# Patient Record
Sex: Male | Born: 1965 | Race: White | Hispanic: No | Marital: Single | Smoking: Former smoker
Health system: Southern US, Community
[De-identification: ages and names within clinical notes are randomized; demographics above are authoritative.]

## PROBLEM LIST (undated history)

## (undated) DIAGNOSIS — K219 Gastro-esophageal reflux disease without esophagitis: Secondary | ICD-10-CM

## (undated) DIAGNOSIS — R519 Headache, unspecified: Secondary | ICD-10-CM

## (undated) DIAGNOSIS — N200 Calculus of kidney: Secondary | ICD-10-CM

## (undated) DIAGNOSIS — I499 Cardiac arrhythmia, unspecified: Secondary | ICD-10-CM

## (undated) DIAGNOSIS — R51 Headache: Secondary | ICD-10-CM

## (undated) DIAGNOSIS — I4891 Unspecified atrial fibrillation: Secondary | ICD-10-CM

## (undated) DIAGNOSIS — G4730 Sleep apnea, unspecified: Secondary | ICD-10-CM

## (undated) HISTORY — DX: Sleep apnea, unspecified: G47.30

## (undated) HISTORY — PX: OTHER SURGICAL HISTORY: SHX169

---

## 2003-12-21 ENCOUNTER — Other Ambulatory Visit: Payer: Self-pay

## 2005-01-26 ENCOUNTER — Emergency Department: Payer: Self-pay | Admitting: Emergency Medicine

## 2005-01-27 ENCOUNTER — Emergency Department: Payer: Self-pay | Admitting: Unknown Physician Specialty

## 2005-02-09 ENCOUNTER — Ambulatory Visit: Payer: Self-pay | Admitting: Urology

## 2005-02-20 ENCOUNTER — Emergency Department: Payer: Self-pay | Admitting: Emergency Medicine

## 2005-04-06 ENCOUNTER — Emergency Department: Payer: Self-pay | Admitting: Emergency Medicine

## 2005-05-06 ENCOUNTER — Emergency Department: Payer: Self-pay | Admitting: Emergency Medicine

## 2006-03-29 ENCOUNTER — Emergency Department: Payer: Self-pay | Admitting: Internal Medicine

## 2006-11-04 ENCOUNTER — Emergency Department (HOSPITAL_COMMUNITY): Admission: EM | Admit: 2006-11-04 | Discharge: 2006-11-04 | Payer: Self-pay | Admitting: Emergency Medicine

## 2006-11-05 ENCOUNTER — Inpatient Hospital Stay (HOSPITAL_COMMUNITY): Admission: AD | Admit: 2006-11-05 | Discharge: 2006-11-09 | Payer: Self-pay | Admitting: Psychiatry

## 2006-11-05 ENCOUNTER — Ambulatory Visit: Payer: Self-pay | Admitting: Psychiatry

## 2006-11-18 ENCOUNTER — Emergency Department: Payer: Self-pay

## 2008-12-07 ENCOUNTER — Emergency Department: Payer: Self-pay | Admitting: Emergency Medicine

## 2008-12-08 ENCOUNTER — Emergency Department: Payer: Self-pay | Admitting: Unknown Physician Specialty

## 2009-12-08 ENCOUNTER — Emergency Department: Payer: Self-pay | Admitting: Emergency Medicine
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
## 2013-03-08 ENCOUNTER — Emergency Department: Payer: Self-pay | Admitting: Emergency Medicine

## 2013-07-30 ENCOUNTER — Emergency Department: Payer: Self-pay | Admitting: Emergency Medicine

## 2013-07-30 LAB — CBC
HCT: 45.8 % (ref 40.0–52.0)
HGB: 15.8 g/dL (ref 13.0–18.0)
MCH: 30.6 pg (ref 26.0–34.0)
MCHC: 34.5 g/dL (ref 32.0–36.0)
MCV: 89 fL (ref 80–100)
PLATELETS: 204 10*3/uL (ref 150–440)
RBC: 5.17 10*6/uL (ref 4.40–5.90)
RDW: 12.5 % (ref 11.5–14.5)
WBC: 9.5 10*3/uL (ref 3.8–10.6)

## 2013-07-30 LAB — URINALYSIS, COMPLETE
BILIRUBIN, UR: NEGATIVE
BLOOD: NEGATIVE
Bacteria: NONE SEEN
Glucose,UR: 150 mg/dL (ref 0–75)
Ketone: NEGATIVE
Leukocyte Esterase: NEGATIVE
Nitrite: NEGATIVE
PH: 6 (ref 4.5–8.0)
Protein: NEGATIVE
Specific Gravity: 1.019 (ref 1.003–1.030)
Squamous Epithelial: NONE SEEN
WBC UR: <1 /HPF (ref 0–5)

## 2013-07-30 LAB — COMPREHENSIVE METABOLIC PANEL
ALBUMIN: 4.1 g/dL (ref 3.4–5.0)
Alkaline Phosphatase: 94 U/L
Anion Gap: 2 — ABNORMAL LOW (ref 7–16)
BUN: 17 mg/dL (ref 7–18)
Bilirubin,Total: 0.6 mg/dL (ref 0.2–1.0)
CHLORIDE: 102 mmol/L (ref 98–107)
CREATININE: 0.71 mg/dL (ref 0.60–1.30)
Calcium, Total: 9.3 mg/dL (ref 8.5–10.1)
Co2: 32 mmol/L (ref 21–32)
Glucose: 109 mg/dL — ABNORMAL HIGH (ref 65–99)
OSMOLALITY: 274 (ref 275–301)
POTASSIUM: 4 mmol/L (ref 3.5–5.1)
SGOT(AST): 44 U/L — ABNORMAL HIGH (ref 15–37)
SGPT (ALT): 53 U/L (ref 12–78)
Sodium: 136 mmol/L (ref 136–145)
Total Protein: 7.8 g/dL (ref 6.4–8.2)

## 2015-09-29 ENCOUNTER — Encounter: Payer: Self-pay | Admitting: Medical Oncology

## 2015-09-29 ENCOUNTER — Emergency Department: Payer: Self-pay

## 2015-09-29 ENCOUNTER — Observation Stay
Admission: EM | Admit: 2015-09-29 | Discharge: 2015-10-01 | Disposition: A | Discharge status: Home / Self Care | Payer: Self-pay | Attending: Internal Medicine | Admitting: Internal Medicine

## 2015-09-29 DIAGNOSIS — Z8249 Family history of ischemic heart disease and other diseases of the circulatory system: Secondary | ICD-10-CM | POA: Insufficient documentation

## 2015-09-29 DIAGNOSIS — K219 Gastro-esophageal reflux disease without esophagitis: Secondary | ICD-10-CM | POA: Insufficient documentation

## 2015-09-29 DIAGNOSIS — R109 Unspecified abdominal pain: Secondary | ICD-10-CM | POA: Insufficient documentation

## 2015-09-29 DIAGNOSIS — Z590 Homelessness: Secondary | ICD-10-CM | POA: Insufficient documentation

## 2015-09-29 DIAGNOSIS — I4891 Unspecified atrial fibrillation: Principal | ICD-10-CM | POA: Diagnosis present

## 2015-09-29 DIAGNOSIS — I429 Cardiomyopathy, unspecified: Secondary | ICD-10-CM | POA: Insufficient documentation

## 2015-09-29 DIAGNOSIS — R112 Nausea with vomiting, unspecified: Secondary | ICD-10-CM | POA: Insufficient documentation

## 2015-09-29 DIAGNOSIS — R42 Dizziness and giddiness: Secondary | ICD-10-CM | POA: Insufficient documentation

## 2015-09-29 DIAGNOSIS — R0602 Shortness of breath: Secondary | ICD-10-CM | POA: Insufficient documentation

## 2015-09-29 DIAGNOSIS — Z87442 Personal history of urinary calculi: Secondary | ICD-10-CM | POA: Insufficient documentation

## 2015-09-29 DIAGNOSIS — R519 Headache, unspecified: Secondary | ICD-10-CM

## 2015-09-29 DIAGNOSIS — R51 Headache: Secondary | ICD-10-CM | POA: Insufficient documentation

## 2015-09-29 DIAGNOSIS — I10 Essential (primary) hypertension: Secondary | ICD-10-CM | POA: Insufficient documentation

## 2015-09-29 DIAGNOSIS — R17 Unspecified jaundice: Secondary | ICD-10-CM | POA: Insufficient documentation

## 2015-09-29 HISTORY — DX: Unspecified atrial fibrillation: I48.91

## 2015-09-29 HISTORY — DX: Calculus of kidney: N20.0

## 2015-09-29 HISTORY — DX: Gastro-esophageal reflux disease without esophagitis: K21.9

## 2015-09-29 LAB — URINALYSIS COMPLETE WITH MICROSCOPIC (ARMC ONLY)
Bacteria, UA: NONE SEEN
Bilirubin Urine: NEGATIVE
Glucose, UA: NEGATIVE mg/dL
Hgb urine dipstick: NEGATIVE
Leukocytes, UA: NEGATIVE
Nitrite: NEGATIVE
PROTEIN: 30 mg/dL — AB
Specific Gravity, Urine: 1.03 (ref 1.005–1.030)
Squamous Epithelial / LPF: NONE SEEN
pH: 9 — ABNORMAL HIGH (ref 5.0–8.0)

## 2015-09-29 LAB — COMPREHENSIVE METABOLIC PANEL
ALBUMIN: 4.5 g/dL (ref 3.5–5.0)
ALK PHOS: 81 U/L (ref 38–126)
ALT: 41 U/L (ref 17–63)
ANION GAP: 7 (ref 5–15)
AST: 39 U/L (ref 15–41)
BILIRUBIN TOTAL: 1.9 mg/dL — AB (ref 0.3–1.2)
BUN: 19 mg/dL (ref 6–20)
CO2: 23 mmol/L (ref 22–32)
Calcium: 9.8 mg/dL (ref 8.9–10.3)
Chloride: 107 mmol/L (ref 101–111)
Creatinine, Ser: 0.65 mg/dL (ref 0.61–1.24)
GFR calc Af Amer: >60 mL/min (ref >60)
GLUCOSE: 96 mg/dL (ref 65–99)
Potassium: 4.7 mmol/L (ref 3.5–5.1)
Sodium: 137 mmol/L (ref 135–145)
Total Protein: 7.5 g/dL (ref 6.5–8.1)

## 2015-09-29 LAB — URINE DRUG SCREEN, QUALITATIVE (ARMC ONLY)
AMPHETAMINES, UR SCREEN: NOT DETECTED
Barbiturates, Ur Screen: NOT DETECTED
Benzodiazepine, Ur Scrn: NOT DETECTED
Cannabinoid 50 Ng, Ur ~~LOC~~: POSITIVE — AB
Cocaine Metabolite,Ur ~~LOC~~: NOT DETECTED
MDMA (ECSTASY) UR SCREEN: NOT DETECTED
METHADONE SCREEN, URINE: NOT DETECTED
Opiate, Ur Screen: NOT DETECTED
Phencyclidine (PCP) Ur S: NOT DETECTED
TRICYCLIC, UR SCREEN: NOT DETECTED

## 2015-09-29 LAB — CBC
HEMATOCRIT: 48.3 % (ref 40.0–52.0)
HEMOGLOBIN: 16.6 g/dL (ref 13.0–18.0)
MCH: 30 pg (ref 26.0–34.0)
MCHC: 34.3 g/dL (ref 32.0–36.0)
MCV: 87.5 fL (ref 80.0–100.0)
Platelets: 180 10*3/uL (ref 150–440)
RBC: 5.52 MIL/uL (ref 4.40–5.90)
RDW: 13.5 % (ref 11.5–14.5)
WBC: 10.4 10*3/uL (ref 3.8–10.6)

## 2015-09-29 LAB — BRAIN NATRIURETIC PEPTIDE: B NATRIURETIC PEPTIDE 5: 45 pg/mL (ref 0.0–100.0)

## 2015-09-29 LAB — TROPONIN I: Troponin I: <0.03 ng/mL (ref <0.031)

## 2015-09-29 MED ORDER — SODIUM CHLORIDE 0.9% FLUSH: 3.0000 mL | INTRAVENOUS | Status: DC | PRN

## 2015-09-29 MED ORDER — DILTIAZEM HCL 25 MG/5ML IV SOLN
10.0000 mg | Freq: Once | INTRAVENOUS | Status: AC
  Administered 2015-09-29: 10 mg via INTRAVENOUS
  Filled 2015-09-29: qty 5

## 2015-09-29 MED ORDER — ACETAMINOPHEN 650 MG RE SUPP: 650.0000 mg | Freq: Four times a day (QID) | RECTAL | Status: DC | PRN

## 2015-09-29 MED ORDER — HEPARIN (PORCINE) IN NACL 100-0.45 UNIT/ML-% IJ SOLN
1350.0000 [IU]/h | INTRAMUSCULAR | Status: DC
  Administered 2015-09-29: 1350 [IU]/h via INTRAVENOUS
  Filled 2015-09-29 (×3): qty 250

## 2015-09-29 MED ORDER — ONDANSETRON HCL 4 MG PO TABS: 4.0000 mg | ORAL_TABLET | Freq: Four times a day (QID) | ORAL | Status: DC | PRN

## 2015-09-29 MED ORDER — HYDROCODONE-ACETAMINOPHEN 5-325 MG PO TABS: 1.0000 | ORAL_TABLET | ORAL | Status: DC | PRN

## 2015-09-29 MED ORDER — SODIUM CHLORIDE 0.9 % IV SOLN: 250.0000 mL | INTRAVENOUS | Status: DC | PRN

## 2015-09-29 MED ORDER — FAMOTIDINE 20 MG PO TABS
20.0000 mg | ORAL_TABLET | Freq: Every day | ORAL | Status: DC
  Administered 2015-09-29 – 2015-10-01 (×3): 20 mg via ORAL
  Filled 2015-09-29 (×3): qty 1

## 2015-09-29 MED ORDER — ACETAMINOPHEN 325 MG PO TABS: 650.0000 mg | ORAL_TABLET | Freq: Four times a day (QID) | ORAL | Status: DC | PRN

## 2015-09-29 MED ORDER — SODIUM CHLORIDE 0.9% FLUSH
3.0000 mL | Freq: Two times a day (BID) | INTRAVENOUS | Status: DC
  Administered 2015-09-29 – 2015-09-30 (×2): 3 mL via INTRAVENOUS

## 2015-09-29 MED ORDER — MORPHINE SULFATE (PF) 4 MG/ML IV SOLN: 4.0000 mg | INTRAVENOUS | Status: DC | PRN

## 2015-09-29 MED ORDER — ALBUTEROL SULFATE (2.5 MG/3ML) 0.083% IN NEBU: 2.5000 mg | INHALATION_SOLUTION | RESPIRATORY_TRACT | Status: DC | PRN

## 2015-09-29 MED ORDER — ASPIRIN 325 MG PO TABS
325.0000 mg | ORAL_TABLET | Freq: Every day | ORAL | Status: DC
  Administered 2015-09-29 – 2015-09-30 (×2): 325 mg via ORAL
  Filled 2015-09-29 (×2): qty 1

## 2015-09-29 MED ORDER — ONDANSETRON HCL 4 MG/2ML IJ SOLN: 4.0000 mg | Freq: Four times a day (QID) | INTRAMUSCULAR | Status: DC | PRN

## 2015-09-29 MED ORDER — DILTIAZEM HCL 25 MG/5ML IV SOLN
20.0000 mg | Freq: Once | INTRAVENOUS | Status: AC
  Administered 2015-09-29: 20 mg via INTRAVENOUS
  Filled 2015-09-29: qty 5

## 2015-09-29 MED ORDER — HEPARIN BOLUS VIA INFUSION
5000.0000 [IU] | Freq: Once | INTRAVENOUS | Status: AC
  Administered 2015-09-29: 5000 [IU] via INTRAVENOUS
  Filled 2015-09-29: qty 5000

## 2015-09-29 MED ORDER — METOPROLOL TARTRATE 25 MG PO TABS
25.0000 mg | ORAL_TABLET | Freq: Two times a day (BID) | ORAL | Status: DC
  Administered 2015-09-29 – 2015-09-30 (×2): 25 mg via ORAL
  Filled 2015-09-29 (×2): qty 1

## 2015-09-29 NOTE — H&P (Addendum)
[facility] - [place] [facility]   PATIENT NAME: Rickey Lane    MR#:  [id number]  DATE OF BIRTH:  04-07-1966  DATE OF ADMISSION:  09/29/2015  PRIMARY CARE PHYSICIAN: No primary care provider on file.   REQUESTING/REFERRING PHYSICIAN: Leona Carry, MD  CHIEF COMPLAINT:   Chief Complaint  Patient presents with  . Headache  . Nausea  . Flank Pain  Headache, nausea and flank pain today.  HISTORY OF PRESENT ILLNESS:  Rickey Lane  is a 50 y.o. male with a known history of Hypertension, GERD and kidney stone. The patient woke up in the morning with a severe headache and dizziness. He took Tylenol with some improvement but is still feel dizzy and weak. He also complains of mild right flank pain on and off since he was diagnosis with kidney stone 2 months ago. He was found A. fib with RVR at 120s in the ED. Was treated with Cardizem 10 mg 1 dose without improvement. Current Heart rate is 100-127. CAT scan of the head is negative.  PAST MEDICAL HISTORY:   Past Medical History  Diagnosis Date  . Hypertension   . GERD (gastroesophageal reflux disease)   . Kidney stone     PAST SURGICAL HISTORY:  No past surgical history on file. no surgery.  SOCIAL HISTORY:   Social History  Substance Use Topics  . Smoking status: Never Smoker   . Smokeless tobacco: Not on file  . Alcohol Use: No    FAMILY HISTORY:   Family History  Problem Relation Age of Onset  . Heart attack Father   . Heart attack Other   His father had a heart attack at 69's years old and got open heart surgery had surgeries 66's years old. His mother's sister also had a heart attack.  DRUG ALLERGIES:   Allergies  Allergen Reactions  . Nitroglycerin     REVIEW OF SYSTEMS:  CONSTITUTIONAL: No fever, But has headache, dizziness and weakness.  EYES: No blurred or double vision.  EARS, NOSE, AND THROAT: No tinnitus or ear pain.  RESPIRATORY: No cough, but has mild  shortness of breath, no wheezing or hemoptysis.  CARDIOVASCULAR: No chest pain, orthopnea, edema.  GASTROINTESTINAL: No nausea, vomiting, diarrhea or abdominal pain. Intermittent and mild right flank pain. GENITOURINARY: No dysuria, hematuria.  ENDOCRINE: No polyuria, nocturia,  HEMATOLOGY: No anemia, easy bruising or bleeding SKIN: No rash or lesion. MUSCULOSKELETAL: No joint pain or arthritis.   NEUROLOGIC: No tingling, numbness, weakness.  PSYCHIATRY: No anxiety or depression.   MEDICATIONS AT HOME:   Prior to Admission medications   Medication Sig Start Date End Date Taking? Authorizing Provider  acetaminophen (TYLENOL) 325 MG tablet Take 650 mg by mouth every 6 (six) hours as needed.   Yes Historical Provider, MD  ranitidine (ZANTAC) 150 MG tablet Take 150 mg by mouth daily as needed for heartburn.   Yes Historical Provider, MD      VITAL SIGNS:  Blood pressure 130/82, pulse 74, temperature 97.6 F (36.4 C), temperature source Oral, resp. rate 15, height  (1.778 m), weight 90.719 kg (200 lb), SpO2 100 %.  PHYSICAL EXAMINATION:  GENERAL:  50 y.o.-year-old patient lying in the bed with no acute distress.  EYES: Pupils equal, round, reactive to light and accommodation. No scleral icterus. Extraocular muscles intact.  HEENT: Head atraumatic, normocephalic. Oropharynx and nasopharynx clear.  NECK:  Supple, no jugular venous distention. No thyroid enlargement, no tenderness.  LUNGS: Normal breath  sounds bilaterally, no wheezing, rales,rhonchi or crepitation. No use of accessory muscles of respiration.  CARDIOVASCULAR: Irregular rate and rhythm, tachycardia. No murmurs, rubs, or gallops.  ABDOMEN: Soft, nontender, nondistended. Bowel sounds present. No organomegaly or mass.  EXTREMITIES: No pedal edema, cyanosis, or clubbing.  NEUROLOGIC: Cranial nerves II through XII are intact. Muscle strength 5/5 in all extremities. Sensation intact. Gait not checked.  PSYCHIATRIC: The  patient is alert and oriented x 3.  SKIN: No obvious rash, lesion, or ulcer.   LABORATORY PANEL:   CBC  Recent Labs Lab 09/29/15 1104  WBC 10.4  HGB 16.6  HCT 48.3  PLT 180   ------------------------------------------------------------------------------------------------------------------  Chemistries   Recent Labs Lab 09/29/15 1104  NA 137  K 4.7  CL 107  CO2 23  GLUCOSE 96  BUN 19  CREATININE 0.65  CALCIUM 9.8  AST 39  ALT 41  ALKPHOS 81  BILITOT 1.9*   ------------------------------------------------------------------------------------------------------------------  Cardiac Enzymes  Recent Labs Lab 09/29/15 1104  TROPONINI <0.03   ------------------------------------------------------------------------------------------------------------------  RADIOLOGY:  Dg Chest 1 View  09/29/2015  CLINICAL DATA:  Headache, dizzyness, nausea, vomiting x today. Sob, afib. Nonsmoker. EXAM: CHEST  1 VIEW COMPARISON:  12/07/2008 FINDINGS: Lungs are clear. Heart size and mediastinal contours are within normal limits. No effusion. Visualized skeletal structures are unremarkable. IMPRESSION: No acute cardiopulmonary disease. Electronically Signed   By: Corlis Leak  Hassell M.D.   On: 09/29/2015 11:55   Ct Head Wo Contrast  09/29/2015  CLINICAL DATA:  Acute onset frontal headache, nausea, and dizziness beginning this morning. EXAM: CT HEAD WITHOUT CONTRAST TECHNIQUE: Contiguous axial images were obtained from the base of the skull through the vertex without intravenous contrast. COMPARISON:  12/08/2009 FINDINGS: No evidence of intracranial hemorrhage, brain edema, or other signs of acute infarction. No evidence of intracranial mass lesion or mass effect. No abnormal extraaxial fluid collections identified. Ventricles are normal in size. No skull abnormality identified. IMPRESSION: Negative noncontrast head CT. Electronically Signed   By: Myles RosenthalJohn  Stahl M.D.   On: 09/29/2015 11:57    EKG:    Orders placed or performed in visit on 12/08/08  . EKG 12-Lead    IMPRESSION AND PLAN:   New-onset A. fib with RVR The patient will be placed for observation.  Continue telemetry monitor, I will give 1 more dose Cardizem IV, start aspirin, heparin drip, check lipid panel, echocardiogram and cardiology consult. If heart rate is not controlled, I will start Cardizem drip.  Hypertension. Start Lopressor po.  Elevated bilirubin. I will get ultrasound of abdomen. Repeat bilirubin level tomorrow morning.  History of nephrolithiasis with mild right flank pain. Pain control.  Headache. Unclear etiology resolved with Tylenol.  All the records are reviewed and case discussed with ED provider. Management plans discussed with the patient, family and they are in agreement.  CODE STATUS: Full code  TOTAL TIME TAKING CARE OF THIS PATIENT: 55 minutes.    Shaune Pollackhen, Lynsee Wands M.D on 09/29/2015 at 1:01 PM  Between 7am to 6pm - Pager - [phone]  After 6pm go to [email or web address] - password [email or web address] [facility]  [facility]  Office  [phone]  CC: Primary care physician; No primary care provider on file.

## 2015-09-29 NOTE — ED Notes (Signed)
Pt reports he woke up this am with headache and nausea.

## 2015-09-29 NOTE — Progress Notes (Signed)
Patient alert and oriented x4. Oriented to room, unit, and call bell. Admission completed. No complaints at this time. Will cont to assess. Skin assessment verified by Jessica Christmas, RN. Telemetry box verified. Rickey Lane   

## 2015-09-29 NOTE — ED Notes (Signed)
Pt also reports rt sided flank pain x 2 months. No difficulties voiding.

## 2015-09-29 NOTE — ED Provider Notes (Signed)
[facility] Emergency Department Provider Note  ____________________________________________  Time seen: Approximately 10:30 AM  I have reviewed the triage vital signs and the nursing notes.   HISTORY  Chief Complaint Headache; Nausea; and Flank Pain    HPI Rickey Lane is a 50 y.o. male who woke up this morning with a severe headache and dizziness. Patient states that he has never had any significant headache like that before. Patient took some Tylenol and states the headache is somewhat improved but he is still fillings slightly dizzy and weak. Patient states that his flank pain on the right side has been there since he had a kidney stone 2 months ago. It still causes him some difficulty urinating and some intermittent pain. Patient denies any significant history of heart or lung problems. Patient denies any vomiting or change in his bowels. He has not had any significant fever chills or recent illnesses. Patient states the headache is now about a 1 on a scale of 0-10 prior to taking the Tylenol it was about a 10. Nothing seems to make the headache worse or better.   Past Medical History  Diagnosis Date  . Hypertension   . GERD (gastroesophageal reflux disease)     There are no active problems to display for this patient.   No past surgical history on file.  Current Outpatient Rx  Name  Route  Sig  Dispense  Refill  . acetaminophen (TYLENOL) 325 MG tablet   Oral   Take 650 mg by mouth every 6 (six) hours as needed.         . ranitidine (ZANTAC) 150 MG tablet   Oral   Take 150 mg by mouth daily as needed for heartburn.           Allergies Nitroglycerin  No family history on file.  Social History Social History  Substance Use Topics  . Smoking status: Never Smoker   . Smokeless tobacco: None  . Alcohol Use: No    Review of Systems Constitutional: No fever/chills Eyes: No visual changes. ENT: No sore throat. Cardiovascular:  Denies chest pain. Respiratory: Denies shortness of breath. GastrointestinalPatient is complaining that he's had point pain mild on the right since having a kidney stone about 2 months ago, and occasionally gets nauseated. no vomiting.  No diarrhea.  No constipation. Genitourinary: Negative for dysuria. Musculoskeletal: Negative for back pain. Skin: Negative for rash. Neurological: Positive for headache and dizziness and just generalized weakness all over, no focal numbness.  10-point ROS otherwise negative.  ____________________________________________   PHYSICAL EXAM:  VITAL SIGNS: ED Triage Vitals  Enc Vitals Group     BP 09/29/15 0856 130/82 mmHg     Pulse Rate 09/29/15 0856 74     Resp 09/29/15 0856 15     Temp 09/29/15 0856 97.6 F (36.4 C)     Temp Source 09/29/15 0856 Oral     SpO2 09/29/15 0856 100 %     Weight 09/29/15 0856 200 lb (90.719 kg)     Height 09/29/15 0856  (1.778 m)     Head Cir --      Peak Flow --      Pain Score 09/29/15 0900 8     Pain Loc --      Pain Edu? --      Excl. in GC? --     Constitutional: Alert and oriented. Well appearing and in no acute distress.Patient states his headache is much improved at this time  but he still feels slightly dizzy. Eyes: Conjunctivae are normal. PERRL. EOMI. Head: Atraumatic. Nose: No congestion/rhinnorhea. Mouth/Throat: Mucous membranes are moist.  Oropharynx non-erythematous. Neck: No stridor.   Cardiovascular: Rapid rate, irregular rhythm. Grossly normal heart sounds.  Good peripheral circulation. Respiratory: Normal respiratory effort.  No retractions. Lungs CTAB. Gastrointestinal: Soft and nontender. No distention. No abdominal bruits. No CVA tenderness. Musculoskeletal: No lower extremity tenderness nor edema.  No joint effusions. Neurologic:  Normal speech and language. No gross focal neurologic deficits are appreciated. No gait instability. Skin:  Skin is warm, dry and intact. No rash  noted. Psychiatric: Mood and affect are normal. Speech and behavior are normal.  ____________________________________________   LABS (all labs ordered are listed, but only abnormal results are displayed)  Labs Reviewed  URINALYSIS COMPLETEWITH MICROSCOPIC ([facility] ONLY) - Abnormal; Notable for the following:    Color, Urine YELLOW (*)    APPearance CLEAR (*)    Ketones, ur TRACE (*)    pH 9.0 (*)    Protein, ur 30 (*)    All other components within normal limits  COMPREHENSIVE METABOLIC PANEL - Abnormal; Notable for the following:    Total Bilirubin 1.9 (*)    All other components within normal limits  CBC  TROPONIN I  BRAIN NATRIURETIC PEPTIDE  URINE DRUG SCREEN, QUALITATIVE ([facility] ONLY)   ____________________________________________  EKG  ED ECG REPORT I, Leona Carryaylor,  Deleah Tison M, the attending physician, personally viewed and interpreted this ECG.   Date: 09/29/2015  EKG Time: 1058  Rate: 127  Rhythm: atrial fibrillation, rate 127  Axis: Normal  Intervals:Prolonged QT  ST&T Change: Nonspecific ST and T-wave changes  ____________________________________________  RADIOLOGY  Dg Chest 1 View  09/29/2015  CLINICAL DATA:  Headache, dizzyness, nausea, vomiting x today. Sob, afib. Nonsmoker. EXAM: CHEST  1 VIEW COMPARISON:  12/07/2008 FINDINGS: Lungs are clear. Heart size and mediastinal contours are within normal limits. No effusion. Visualized skeletal structures are unremarkable. IMPRESSION: No acute cardiopulmonary disease. Electronically Signed   By: Corlis Leak  Hassell M.D.   On: 09/29/2015 11:55   Ct Head Wo Contrast  09/29/2015  CLINICAL DATA:  Acute onset frontal headache, nausea, and dizziness beginning this morning. EXAM: CT HEAD WITHOUT CONTRAST TECHNIQUE: Contiguous axial images were obtained from the base of the skull through the vertex without intravenous contrast. COMPARISON:  12/08/2009 FINDINGS: No evidence of intracranial hemorrhage, brain edema, or other signs of acute  infarction. No evidence of intracranial mass lesion or mass effect. No abnormal extraaxial fluid collections identified. Ventricles are normal in size. No skull abnormality identified. IMPRESSION: Negative noncontrast head CT. Electronically Signed   By: Myles RosenthalJohn  Stahl M.D.   On: 09/29/2015 11:57    ____________________________________________   PROCEDURES  Procedure(s) performed: None  Critical Care performed: No  ____________________________________________   INITIAL IMPRESSION / ASSESSMENT AND PLAN / ED COURSE  Pertinent labs & imaging results that were available during my care of the patient were reviewed by me and considered in my medical decision making (see chart for details).  12:34 PM Patient was given IV Cardizem for his atrial fibrillation with RVR. This was new onset for patient. Patient will get routine cardiac enzymes as well as CT head to evaluate his headache and dizziness.  12:34 PM Labs and CT scan were negative. Patient was given 10 mg IV Cardizem which helped slow his heart rate down but he did not convert back into a normal rhythm. Patient though felt some better after the IV Cardizem. Dr. Fonnie BirkenheadWhiting  is going to admit the patient. ____________________________________________   FINAL CLINICAL IMPRESSION(S) / ED DIAGNOSES  Final diagnoses:  Acute nonintractable headache, unspecified headache type  Dizziness  Atrial fibrillation with RVR (HCC)       Leona Carry, MD 09/29/15 1234

## 2015-09-29 NOTE — ED Notes (Signed)
Pt states he woke up this AM with complaints of a headache and "not feeling well", also states some right sided flank pain with a hx of kidney stones, pt awake and alert in no acute distress, EDP at bedside

## 2015-09-30 ENCOUNTER — Encounter: Payer: Self-pay | Admitting: Student

## 2015-09-30 ENCOUNTER — Observation Stay: Payer: Self-pay

## 2015-09-30 DIAGNOSIS — I4891 Unspecified atrial fibrillation: Secondary | ICD-10-CM

## 2015-09-30 LAB — CBC
HCT: 45.7 % (ref 40.0–52.0)
HEMOGLOBIN: 15.8 g/dL (ref 13.0–18.0)
MCH: 31 pg (ref 26.0–34.0)
MCHC: 34.5 g/dL (ref 32.0–36.0)
MCV: 89.9 fL (ref 80.0–100.0)
Platelets: 156 10*3/uL (ref 150–440)
RBC: 5.08 MIL/uL (ref 4.40–5.90)
RDW: 13.3 % (ref 11.5–14.5)
WBC: 9 10*3/uL (ref 3.8–10.6)

## 2015-09-30 LAB — HEPARIN LEVEL (UNFRACTIONATED)
HEPARIN UNFRACTIONATED: 0.65 [IU]/mL (ref 0.30–0.70)
HEPARIN UNFRACTIONATED: 0.42 [IU]/mL (ref 0.30–0.70)

## 2015-09-30 LAB — LIPID PANEL
CHOL/HDL RATIO: 3 ratio
Cholesterol: 152 mg/dL (ref 0–200)
HDL: 51 mg/dL (ref >40)
LDL CALC: 89 mg/dL (ref 0–99)
Triglycerides: 62 mg/dL (ref <150)
VLDL: 12 mg/dL (ref 0–40)

## 2015-09-30 LAB — BILIRUBIN, DIRECT: BILIRUBIN DIRECT: 0.2 mg/dL (ref 0.1–0.5)

## 2015-09-30 LAB — BILIRUBIN, TOTAL: BILIRUBIN TOTAL: 1 mg/dL (ref 0.3–1.2)

## 2015-09-30 LAB — TSH: TSH: 2.291 u[IU]/mL (ref 0.350–4.500)

## 2015-09-30 MED ORDER — APIXABAN 5 MG PO TABS
5.0000 mg | ORAL_TABLET | Freq: Two times a day (BID) | ORAL | Status: DC
  Administered 2015-09-30 – 2015-10-01 (×3): 5 mg via ORAL
  Filled 2015-09-30 (×3): qty 1

## 2015-09-30 MED ORDER — METOPROLOL TARTRATE 50 MG PO TABS
50.0000 mg | ORAL_TABLET | Freq: Two times a day (BID) | ORAL | Status: DC
  Administered 2015-09-30 – 2015-10-01 (×2): 50 mg via ORAL
  Filled 2015-09-30 (×2): qty 1

## 2015-09-30 NOTE — Progress Notes (Signed)
Patient ID: Rickey Lane, male   DOB: [birth year], 50 y.o.   MRN: [id number] [facility] [place] [facility]   PATIENT NAME: Rickey Lane    MR#:  [id number]  DATE OF BIRTH:  Feb 16, 1966  SUBJECTIVE:   Came in with headache and dizziness and found to have afib Denies any complaints today REVIEW OF SYSTEMS:   Review of Systems  Constitutional: Negative for fever, chills and weight loss.  HENT: Negative for ear discharge, ear pain and nosebleeds.   Eyes: Negative for blurred vision, pain and discharge.  Respiratory: Negative for sputum production, shortness of breath, wheezing and stridor.   Cardiovascular: Positive for palpitations. Negative for chest pain, orthopnea and PND.  Gastrointestinal: Negative for nausea, vomiting, abdominal pain and diarrhea.  Genitourinary: Negative for urgency and frequency.  Musculoskeletal: Negative for back pain and joint pain.  Neurological: Negative for sensory change, speech change, focal weakness and weakness.  Psychiatric/Behavioral: Negative for depression and hallucinations. The patient is not nervous/anxious.   All other systems reviewed and are negative.  Tolerating Diet:yes Tolerating PT: not needed  DRUG ALLERGIES:   Allergies  Allergen Reactions  . Nitroglycerin     VITALS:  Blood pressure 126/86, pulse 95, temperature 97.7 F (36.5 C), temperature source Oral, resp. rate 16, height  (1.778 m), weight 90.719 kg (200 lb), SpO2 95 %.  PHYSICAL EXAMINATION:   Physical Exam  GENERAL:  50 y.o.-year-old patient lying in the bed with no acute distress.  EYES: Pupils equal, round, reactive to light and accommodation. No scleral icterus. Extraocular muscles intact.  HEENT: Head atraumatic, normocephalic. Oropharynx and nasopharynx clear.  NECK:  Supple, no jugular venous distention. No thyroid enlargement, no tenderness.  LUNGS: Normal breath sounds bilaterally, no wheezing, rales, rhonchi.  No use of accessory muscles of respiration.  CARDIOVASCULAR: S1, S2 normal. No murmurs, rubs, or gallops. Irregular irregular ABDOMEN: Soft, nontender, nondistended. Bowel sounds present. No organomegaly or mass.  EXTREMITIES: No cyanosis, clubbing or edema b/l.    NEUROLOGIC: Cranial nerves II through XII are intact. No focal Motor or sensory deficits b/l.   PSYCHIATRIC:  patient is alert and oriented x 3.  SKIN: No obvious rash, lesion, or ulcer.   LABORATORY PANEL:  CBC  Recent Labs Lab 09/30/15 0256  WBC 9.0  HGB 15.8  HCT 45.7  PLT 156    Chemistries   Recent Labs Lab 09/29/15 1104 09/30/15 0256  NA 137  --   K 4.7  --   CL 107  --   CO2 23  --   GLUCOSE 96  --   BUN 19  --   CREATININE 0.65  --   CALCIUM 9.8  --   AST 39  --   ALT 41  --   ALKPHOS 81  --   BILITOT 1.9* 1.0   Cardiac Enzymes  Recent Labs Lab 09/29/15 1104  TROPONINI <0.03   RADIOLOGY:  Dg Chest 1 View  09/29/2015  CLINICAL DATA:  Headache, dizzyness, nausea, vomiting x today. Sob, afib. Nonsmoker. EXAM: CHEST  1 VIEW COMPARISON:  12/07/2008 FINDINGS: Lungs are clear. Heart size and mediastinal contours are within normal limits. No effusion. Visualized skeletal structures are unremarkable. IMPRESSION: No acute cardiopulmonary disease. Electronically Signed   By: Corlis Leak M.D.   On: 09/29/2015 11:55   Ct Head Wo Contrast  09/29/2015  CLINICAL DATA:  Acute onset frontal headache, nausea, and dizziness beginning this morning. EXAM: CT HEAD WITHOUT CONTRAST TECHNIQUE:  Contiguous axial images were obtained from the base of the skull through the vertex without intravenous contrast. COMPARISON:  12/08/2009 FINDINGS: No evidence of intracranial hemorrhage, brain edema, or other signs of acute infarction. No evidence of intracranial mass lesion or mass effect. No abnormal extraaxial fluid collections identified. Ventricles are normal in size. No skull abnormality identified. IMPRESSION: Negative  noncontrast head CT. Electronically Signed   By: Myles RosenthalJohn  Stahl M.D.   On: 09/29/2015 11:57   [place] Abdomen Limited Ruq  09/30/2015  CLINICAL DATA:  Elevated bili Rubin. Nausea and vomiting, 2 days duration. Recently ate breakfast. EXAM: US ABDOMEN LIMITED - RIGHT UPPER QUADRANT COMPARISON:  CT 04/06/2005 FINDINGS: Gallbladder: No gallstones or wall thickening visualized. No sonographic Murphy sign noted by sonographer. Common bile duct: Diameter: 3.2 mm, normal Liver: No focal lesion identified. Within normal limits in parenchymal echogenicity. IMPRESSION: Normal examination.  No hepatobiliary pathology evident. Electronically Signed   By: Paulina FusiMark  Shogry M.D.   On: 09/30/2015 10:51   ASSESSMENT AND PLAN:   Rickey Lane is a 50 y.o. male with a known history of Hypertension, GERD and kidney stone. The patient woke up in the morning with a severe headache and dizziness  1.New-onset A. fib with RVR -etio unclear -echo pending Check TSH  Continue telemetry monitor prn Cardizem IV - start aspirin, heparin drip -normal lipid panel HR in the 100's on metoprolol 25 mg bid  2. Hypertension. - -Lopressor po.  3Elevated bilirubin. USG abdomen negative  4.History of nephrolithiasis with mild right flank pain. Pain control.  5.Headache. Unclear etiology resolved with Tylenol.  Case discussed with Care Management/Social Worker. Management plans discussed with the patient, family and they are in agreement.  CODE STATUS: full  DVT Prophylaxis: heparin gtt  TOTAL TIME TAKING CARE OF THIS PATIENT: 30 minutes.  >50% time spent on counselling and coordination of care  POSSIBLE D/C IN  1 DAYS, DEPENDING ON CLINICAL CONDITION.  Note: This dictation was prepared with Dragon dictation along with smaller phrase technology. Any transcriptional errors that result from this process are unintentional.  Shannon Kirkendall M.D on 09/30/2015 at 11:51 AM  Between 7am to 6pm - Pager - [phone]  After 6pm go to  [email or web address] - password [email or web address] [facility]  [facility]  Office  [phone]  CC: Primary care physician; No primary care provider on file.

## 2015-09-30 NOTE — Progress Notes (Signed)
ANTICOAGULATION CONSULT NOTE - Initial Consult  Pharmacy Consult for heparin drip Indication: atrial fibrillation  Allergies  Allergen Reactions  . Nitroglycerin     Patient Measurements: Height: 5\' 10"  (177.8 cm) Weight: 200 lb (90.719 kg) IBW/kg (Calculated) : 73 Heparin Dosing Weight: 91kg  Vital Signs: Temp: 97.7 F (36.5 C) (04/06 1107) Temp Source: Oral (04/06 1107) BP: 126/86 mmHg (04/06 1107) Pulse Rate: 95 (04/06 1108)  Labs:  Recent Labs  09/29/15 1104 09/30/15 0256 09/30/15 1128  HGB 16.6 15.8  --   HCT 48.3 45.7  --   PLT 180 156  --   HEPARINUNFRC  --  0.65 0.42  CREATININE 0.65  --   --   TROPONINI <0.03  --   --     Estimated Creatinine Clearance: 127.9 mL/min (by C-G formula based on Cr of 0.65).   Medical History: Past Medical History  Diagnosis Date  . GERD (gastroesophageal reflux disease)   . Kidney stone     Medications:    Assessment: 50 y/o M with new-onset afib.   Goal of Therapy:  Heparin level 0.3-0.7 units/ml Monitor platelets by anticoagulation protocol: Yes   Plan:  Heparin level is at goal so will continue heparin drip at current rate and recheck a HL and CBC in AM.    Luisa Harthristy, Jackalyn Haith D 09/30/2015,2:02 PM

## 2015-09-30 NOTE — Consult Note (Signed)
Cardiology Consult    Patient ID: Rickey LarocheChristopher J Lane MRN: [id number], DOB/AGE: 1965-07-02   Admit date: 09/29/2015 Date of Consult: 09/30/2015  Primary Physician: No primary care provider on file. Reason for Consult: Atrial Fibrillation with RVR Primary Cardiologist[facility] Requesting Provider: Dr. Imogene Burnhen  History of Present Illness    Rickey LarocheChristopher J Lane is a 50 y.o. male with past medical history of GERD and nephrolithiasis who [facility] for a headache, nausea, and right-sided flank pain.   The patient reports he developed a headache yesterday morning and could not get any relief with OTC medications. He reported also feeling dizzy at that time. HE also noted right-sided flank pain, similar to his previous kidney stones. Reported frequent urination but denied any burning with urination or hematuria.   While in the ED, he was noted to be in atrial fibrillation with RVR, HR in the 120's. UDS was positive for Cannabis. BNP normal at 45. Troponin negative. WBC 10.4. Hgb 16.6. Platelets 180. Electrolytes within normal limits. Bilirubin elevated to 1.9. Creatinine 0.65. TSH is pending. CXR showed no acute cardiopulmonary disease. CT Head with no acute abnormalities.    The patient denied any history of palpitations or chest pain. Reported feeling dizzy and having mild shortness of breath but no other symptoms while in atrial fibrillation. His HR was noted to go into the 140's this morning with ambulation, yet he was asymptomatic. Reports being told in the past his HR was "irregular" but an EKG was not performed at that time and he has never been formally diagnosed with an arrhythmia. Denies any prior history of CAD, HLD or Type 2 DM. Reports having HTN in the past but he lost weight and was able to stop the medications. Reports his BP has been well-controlled since.  Reports his father had his first MI in the 3130's and passed away from a massive MI in his 2560's.  His maternal aunt passed away from an MI at age 49. He denies any personal history of tobacco abuse or alcohol abuse.  Past Medical History   Past Medical History  Diagnosis Date  . GERD (gastroesophageal reflux disease)   . Kidney stone     History reviewed. No pertinent past surgical history.   Allergies  Allergies  Allergen Reactions  . Nitroglycerin     Inpatient Medications    . aspirin  325 mg Oral Daily  . famotidine  20 mg Oral Daily  . metoprolol tartrate  25 mg Oral BID  . sodium chloride flush  3 mL Intravenous Q12H    Family History    Family History  Problem Relation Age of Onset  . Heart attack Father     Initial MI age 49, deceased in his 5760's from a massive MI  . Heart attack Maternal Aunt [id number]    Social History    Social History   Social History  . Marital Status: Single    Spouse Name: N/A  . Number of Children: N/A  . Years of Education: N/A   Occupational History  . Not on file.   Social History Main Topics  . Smoking status: Never Smoker   . Smokeless tobacco: Not on file  . Alcohol Use: No  . Drug Use: Not on file  . Sexual Activity: Not on file   Other Topics Concern  . Not on file   Social History Narrative     Review of Systems    General:  No chills, fever, night sweats or weight changes.  Cardiovascular:  No chest pain, edema, orthopnea, palpitations, paroxysmal nocturnal dyspnea. Positive for dyspnea. Dermatological: No rash, lesions/masses Respiratory: No cough, dyspnea Urologic: No hematuria, dysuria Abdominal:   No vomiting, diarrhea, bright red blood per rectum, melena, or hematemesis. Positive for right flank pain and nausea. Neurologic:  No visual changes, wkns, changes in mental status. Positive for dizziness. All other systems reviewed and are otherwise negative except as noted above.  Physical Exam    Blood pressure 126/86, pulse 95, temperature 97.7 F (36.5 C), temperature source Oral, resp. rate 16,  height  (1.778 m), weight 200 lb (90.719 kg), SpO2 95 %.  General: Pleasant, Caucasian male appearing in NAD. Psych: Normal affect. Neuro: Alert and oriented X 3. Moves all extremities spontaneously. HEENT: Normal  Neck: Supple without bruits or JVD. Lungs:  Resp regular and unlabored, CTA without wheezing or rales. Heart: Irregularly irregular, no s3, s4, or murmurs. Abdomen: Soft, non-tender, non-distended, BS + x 4.  Extremities: No clubbing, cyanosis or edema. DP/PT/Radials 2+ and equal bilaterally.  Labs    Troponin (Point of Care Test) No results for input(s): TROPIPOC in the last 72 hours.  Recent Labs  09/29/15 1104  TROPONINI <0.03   Lab Results  Component Value Date   WBC 9.0 09/30/2015   HGB 15.8 09/30/2015   HCT 45.7 09/30/2015   MCV 89.9 09/30/2015   PLT 156 09/30/2015     Recent Labs Lab 09/29/15 1104 09/30/15 0256  NA 137  --   K 4.7  --   CL 107  --   CO2 23  --   BUN 19  --   CREATININE 0.65  --   CALCIUM 9.8  --   PROT 7.5  --   BILITOT 1.9* 1.0  ALKPHOS 81  --   ALT 41  --   AST 39  --   GLUCOSE 96  --    Lab Results  Component Value Date   CHOL 152 09/30/2015   HDL 51 09/30/2015   LDLCALC 89 09/30/2015   TRIG 62 09/30/2015   No results found for: [facility]   Radiology Studies    Dg Chest 1 View: 09/29/2015  CLINICAL DATA:  Headache, dizzyness, nausea, vomiting x today. Sob, afib. Nonsmoker. EXAM: CHEST  1 VIEW COMPARISON:  12/07/2008 FINDINGS: Lungs are clear. Heart size and mediastinal contours are within normal limits. No effusion. Visualized skeletal structures are unremarkable. IMPRESSION: No acute cardiopulmonary disease. Electronically Signed   By: Corlis Leak M.D.   On: 09/29/2015 11:55   Ct Head Wo Contrast: 09/29/2015  CLINICAL DATA:  Acute onset frontal headache, nausea, and dizziness beginning this morning. EXAM: CT HEAD WITHOUT CONTRAST TECHNIQUE: Contiguous axial images were obtained from the base of the skull through the  vertex without intravenous contrast. COMPARISON:  12/08/2009 FINDINGS: No evidence of intracranial hemorrhage, brain edema, or other signs of acute infarction. No evidence of intracranial mass lesion or mass effect. No abnormal extraaxial fluid collections identified. Ventricles are normal in size. No skull abnormality identified. IMPRESSION: Negative noncontrast head CT. Electronically Signed   By: Myles Rosenthal M.D.   On: 09/29/2015 11:57    EKG & Cardiac Imaging    EKG: Atrial fibrillation with RVR, HR 127. Isolated TWI in Lead III.  Echocardiogram: Pending  Assessment & Plan    1. New-onset atrial fibrillation - reports being told he had an "irregular heart rate" in the past. No formal diagnosis of  an arrhythmia. - This patients CHA2DS2-VASc Score and unadjusted Ischemic Stroke Rate (% per year) is equal to 0.2 % stroke rate/year from a score of 0. (Has a history of HTN but lost weight and says his BP has been well-controlled and his PCP stopped his medications). Would not require long-term anticoagulation. Heparin discontinued. - CXR without acute abnormalities and UA negative for UTI. Will check TSH. Echocardiogram is pending. - Has a significant family history of CAD. Would recommend an ischemic evaluation in the outpatient setting to assess for further etiology of his arrhythmia unless his echo comes back significantly abnormal.  - started on Lopressor  BID. HR improved at rest, now in the 90's. Elevated into the 140's with ambulation. Will need further titration or transition to Cardizem. - Consider initiation of NOAC for 1 month and bring back for DCCV, for it is unclear how long he has been in the rhythm.   2. Right-sided flank pain/ History of Nephrolithiasis - pain currently resolved. - per admitting team   Signed, Ellsworth Lennox, PA-C 09/30/2015, 12:03 PM Pager: [phone]

## 2015-09-30 NOTE — Care Management (Signed)
Attempted CM assessment. Patient is out of room for procedure/test.

## 2015-09-30 NOTE — Progress Notes (Signed)
ANTICOAGULATION CONSULT NOTE - Initial Consult  Pharmacy Consult for heparin drip Indication: atrial fibrillation  Allergies  Allergen Reactions  . Nitroglycerin     Patient Measurements: Height: 5\' 10"  (177.8 cm) Weight: 200 lb (90.719 kg) IBW/kg (Calculated) : 73 Heparin Dosing Weight: 91kg  Vital Signs: Temp: 97.6 F (36.4 C) (04/05 2013) Temp Source: Oral (04/05 2013) BP: 149/82 mmHg (04/05 2013) Pulse Rate: 108 (04/05 2013)  Labs:  Recent Labs  09/29/15 1104 09/30/15 0256  HGB 16.6 15.8  HCT 48.3 45.7  PLT 180 156  HEPARINUNFRC  --  0.65  CREATININE 0.65  --   TROPONINI <0.03  --     Estimated Creatinine Clearance: 127.9 mL/min (by C-G formula based on Cr of 0.65).   Medical History: Past Medical History  Diagnosis Date  . Hypertension   . GERD (gastroesophageal reflux disease)   . Kidney stone     Medications:    Assessment:  Goal of Therapy:  Heparin level 0.3-0.7 units/ml Monitor platelets by anticoagulation protocol: Yes   Plan:  5000 unit bolus and initial rate of 1350 units/hr. First anti-Xa 6 hours after start of infusion. 4/5 03:00 heparin level 0.65. Recheck in 6 hours to confirm.   Donnalynn Wheeless S 09/30/2015,4:15 AM

## 2015-09-30 NOTE — Progress Notes (Addendum)
Per [place], [place], okay to stop heparin infusion.

## 2015-09-30 NOTE — Care Management Note (Signed)
Case Management Note  Patient Details  Name: Rickey Lane MRN: [id number] Date of Birth: 1965/07/24  Subjective/Objective:   CM consult for discharge planning. No PCP, no insurance.  Homeless and living in a shelter for about a month now. Met with patient at bedside. He reports he has gotten a job and has been working for a week. He will get paid next week. No other income otherwise. Applications given for [facility]. Possible discharge tomorrow. Will follow up for discharge medications assit as needed.                  Action/Plan:   Expected Discharge Date:                  Expected Discharge Plan:  Home/Self Care  In-House Referral:  Clinical Social Work  Discharge planning Services  CM Consult, [facility], [facility], Medication Assistance  Post Acute Care Choice:    Choice offered to:     DME Arranged:    DME Agency:     HH Arranged:    [place] Agency:     Status of Service:  In process, will continue to follow  Medicare Important Message Given:    Date Medicare IM Given:    Medicare IM give by:    Date Additional Medicare IM Given:    Additional Medicare Important Message give by:     If discussed at [place] of Stay Meetings, dates discussed:    Additional Comments:  Jolly Mango, RN 09/30/2015, 2:34 PM

## 2015-10-01 ENCOUNTER — Observation Stay (HOSPITAL_BASED_OUTPATIENT_CLINIC_OR_DEPARTMENT_OTHER)
Admit: 2015-10-01 | Discharge: 2015-10-01 | Disposition: A | Discharge status: Home / Self Care | Payer: MEDICAID | Attending: Student | Admitting: Student

## 2015-10-01 ENCOUNTER — Telehealth: Payer: Self-pay | Admitting: Cardiovascular Disease

## 2015-10-01 DIAGNOSIS — I4891 Unspecified atrial fibrillation: Secondary | ICD-10-CM

## 2015-10-01 LAB — ECHOCARDIOGRAM COMPLETE
Height: 70 in
Weight: 3227.2 oz

## 2015-10-01 LAB — PSA: PSA: 0.61 ng/mL (ref 0.00–4.00)

## 2015-10-01 MED ORDER — METOPROLOL TARTRATE 50 MG PO TABS: 50.0000 mg | ORAL_TABLET | Freq: Two times a day (BID) | ORAL | Status: DC

## 2015-10-01 MED ORDER — APIXABAN 5 MG PO TABS: 5.0000 mg | ORAL_TABLET | Freq: Two times a day (BID) | ORAL | Status: DC

## 2015-10-01 NOTE — Progress Notes (Signed)
Patient ID: Artelia LarocheChristopher J Mcdougall, male   DOB: 1965/12/27, 50 y.o.   MRN: [id number]                                          [facility]    Erroll LunaChristopher Hopping was admitted to the Hospital on 09/29/2015 and Discharged  10/01/2015 and should be excused from work/school   for ** 3 days starting 09/29/2015 , may return to work/school without any restrictions. Return to work Monday 10/04/2015  Call Enedina FinnerSona Vessie Olmsted MD, [facility]  [phone] with questions.  Tiffanyann Deroo M.D on 10/01/2015,at 1:31 PM

## 2015-10-01 NOTE — Progress Notes (Signed)
*  PRELIMINARY RESULTS* Echocardiogram 2D Echocardiogram has been performed.  Georgann HousekeeperJerry R Hege 10/01/2015, 8:36 AM

## 2015-10-01 NOTE — Clinical Social Work Note (Signed)
CSW consulted for patient being allegedly homeless. Patient is homeless but has been living at the local homeless shelter where he will return at discharge. Patient will need to contact the shelter himself to make arrangements to return.  York SpanielMonica Keiland Pickering MSW,LCSW [phone]

## 2015-10-01 NOTE — Progress Notes (Signed)
Hospital Problem List     Principal Problem:   New onset atrial fibrillation [facility](HCC) Active Problems:   Atrial fibrillation with RVR [facility](HCC)    Patient Profile:   Primary Cardiologist: Dr. Kirke CorinArida  50 y.o. male w/ PMH of GERD and nephrolithiasis who [facility] on 09/[facility]/2015 for a headache, nausea, and right-sided flank pain. Found to be in atrial fibrillation w/ RVR.  Subjective   Reports mild dyspnea with activity. Denies any chest pain or palpitations. HR improved in currently in the 70- 80's. Main concern this AM is urinary symptoms.  Inpatient Medications    . apixaban  5 mg Oral BID  . famotidine  20 mg Oral Daily  . metoprolol tartrate  50 mg Oral BID  . sodium chloride flush  3 mL Intravenous Q12H    Vital Signs    Filed Vitals:   09/30/15 1108 09/30/15 1929 09/30/15 1945 10/01/15 0429  BP:  118/77  118/74  Pulse: 95 92  75  Temp:  97.7 F (36.5 C)  97.7 F (36.5 C)  TempSrc:  Oral  Oral  Resp:  16  16  Height:      Weight:   201 lb 11.2 oz (91.491 kg)   SpO2:  96%  95%    Intake/Output Summary (Last 24 hours) at 10/01/15 0751 Last data filed at 10/01/15 0431  Gross per 24 hour  Intake    480 ml  Output   1600 ml  Net  -1120 ml   Filed Weights   09/29/15 0856 09/30/15 1945  Weight: 200 lb (90.719 kg) 201 lb 11.2 oz (91.491 kg)    Physical Exam    General: Well developed, well nourished, male appearing in no acute distress. Head: Normocephalic, atraumatic.  Neck: Supple without bruits, JVD not elevated. Lungs:  Resp regular and unlabored, CTA without wheezing or rales. Heart: Irregularly irregular, S1, S2, no S3, S4, or murmur; no rub. Abdomen: Soft, non-tender, non-distended with normoactive bowel sounds. No hepatomegaly. No rebound/guarding. No obvious abdominal masses. Extremities: No clubbing, cyanosis, or edema. Distal pedal pulses are 2+ bilaterally. Neuro: Alert and oriented X 3. Moves all extremities spontaneously. Psych: Normal  affect.  Labs    CBC  Recent Labs  09/29/15 1104 09/30/15 0256  WBC 10.4 9.0  HGB 16.6 15.8  HCT 48.3 45.7  MCV 87.5 89.9  PLT 180 156   Basic Metabolic Panel  Recent Labs  09/29/15 1104  NA 137  K 4.7  CL 107  CO2 23  GLUCOSE 96  BUN 19  CREATININE 0.65  CALCIUM 9.8   Liver Function Tests  Recent Labs  09/29/15 1104 09/30/15 0256  AST 39  --   ALT 41  --   ALKPHOS 81  --   BILITOT 1.9* 1.0  PROT 7.5  --   ALBUMIN 4.5  --    No results for input(s): LIPASE, AMYLASE in the last 72 hours. Cardiac Enzymes  Recent Labs  09/29/15 1104  TROPONINI <0.03   Fasting Lipid Panel  Recent Labs  09/30/15 0256  CHOL 152  HDL 51  LDLCALC 89  TRIG 62  CHOLHDL 3.0   Thyroid Function Tests  Recent Labs  09/30/15 1128  TSH 2.291    Telemetry   Atrial fibrillation, rate-controlled in the 70's - 80's.   Cardiac Studies and Radiology    Dg Chest 1 View: 09/29/2015  CLINICAL DATA:  Headache, dizzyness, nausea, vomiting x today. Sob, afib. Nonsmoker. EXAM: CHEST  1 VIEW COMPARISON:  12/07/2008 FINDINGS: Lungs are clear. Heart size and mediastinal contours are within normal limits. No effusion. Visualized skeletal structures are unremarkable. IMPRESSION: No acute cardiopulmonary disease. Electronically Signed   By: Corlis Leak M.D.   On: 09/29/2015 11:55   Ct Head Wo Contrast: 09/29/2015  CLINICAL DATA:  Acute onset frontal headache, nausea, and dizziness beginning this morning. EXAM: CT HEAD WITHOUT CONTRAST TECHNIQUE: Contiguous axial images were obtained from the base of the skull through the vertex without intravenous contrast. COMPARISON:  12/08/2009 FINDINGS: No evidence of intracranial hemorrhage, brain edema, or other signs of acute infarction. No evidence of intracranial mass lesion or mass effect. No abnormal extraaxial fluid collections identified. Ventricles are normal in size. No skull abnormality identified. IMPRESSION: Negative noncontrast head CT.  Electronically Signed   By: Myles Rosenthal M.D.   On: 09/29/2015 11:57   US Abdomen Limited Ruq: 09/30/2015  CLINICAL DATA:  Elevated bili Rubin. Nausea and vomiting, 2 days duration. Recently ate breakfast. EXAM: US ABDOMEN LIMITED - RIGHT UPPER QUADRANT COMPARISON:  CT 04/06/2005 FINDINGS: Gallbladder: No gallstones or wall thickening visualized. No sonographic Murphy sign noted by sonographer. Common bile duct: Diameter: 3.2 mm, normal Liver: No focal lesion identified. Within normal limits in parenchymal echogenicity. IMPRESSION: Normal examination.  No hepatobiliary pathology evident. Electronically Signed   By: Paulina Fusi M.D.   On: 09/30/2015 10:51    Echocardiogram: Pending  Assessment & Plan    1. New-onset atrial fibrillation - reports being told he had an "irregular heart rate" for over 3 years. No formal diagnosis of an arrhythmia. - This patients CHA2DS2-VASc Score and unadjusted Ischemic Stroke Rate (% per year) is equal to 0.2 % stroke rate/year from a score of 0. Started on Eliquis in case he requires DCCV in 4 weeks. Would not require long-term anticoagulation. Will need 30-day Eliquis card at time of discharge. The patient is currently homeless and Case Management is assisting with medication needs.  - CXR without acute abnormalities and UA negative for UTI. TSH normal. Echocardiogram is pending (Updated as Anticipated Discharge). - Has a significant family history of CAD. Could consider an ischemic evaluation in the outpatient setting to assess for further etiology of his arrhythmia unless his echo comes back significantly abnormal.  - Lopressor increased to  BID. HR now well-controlled in the 70's - 80's.  - If echocardiogram is not significantly abnormal, he would likely be stable for discharge from a cardiac perspective.  2. Right-sided flank pain/ History of Nephrolithiasis/ Dysuria - pain currently resolved. Still reporting difficulty with initiation of stream. - per  admitting team   Signed, Ellsworth Lennox , PA-C 7:51 AM 10/01/2015 Pager: [phone]

## 2015-10-01 NOTE — Discharge Summary (Signed)
[facility] [place] [facility]   PATIENT NAME: Rickey LunaChristopher Lane    MR#:  [id number]  DATE OF BIRTH:  Jul 13, 1965  DATE OF ADMISSION:  09/29/2015 ADMITTING PHYSICIAN: Shaune PollackQing Chen, MD  DATE OF DISCHARGE: 4/7 17  PRIMARY CARE PHYSICIAN: No primary care provider on file.    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 GERD (gastroesophageal reflux disease)   . Kidney stone   . Atrial fibrillation (HCC)     a. initially diagnosed in 09/2015. Reported a history of "irregular HR" for 3+ years    HOSPITAL COURSE:  Rickey LunaChristopher Lane is a 50 y.o. male with a known history of Hypertension, GERD and kidney stone. The patient woke up in the morning with a severe headache and dizziness  1.New-onset A. fib with RVR with cardiomyopathy -etio unclear -echo per Dr Kirke CorinArida shows EF 30-35%  TSH ok Continue telemetry monitor prn Cardizem IV - on po eliquis -normal lipid panel HR in the 100's on metoprolol 50 mg bid--->possible CV at later date  2. Hypertension. - -Lopressor po.  3Elevated bilirubin. USG abdomen negative  4.History of nephrolithiasis with mild right flank pain. Pain control.  5.Headache. Unclear etiology resolved with Tylenol.  D/c home  CONSULTS OBTAINED:  Treatment Team:  Iran OuchMuhammad A Arida, MD  DRUG ALLERGIES:   Allergies  Allergen Reactions  . Nitroglycerin Other (See Comments)    "Shivering" and "decreased heart rate"    DISCHARGE MEDICATIONS:   Current Discharge Medication List    START taking these medications   Details  apixaban (ELIQUIS) 5 MG TABS tablet Take 1 tablet (5 mg total) by mouth 2 (two) times daily. Qty: 60 tablet, Refills: 3    metoprolol (LOPRESSOR) 50 MG  tablet Take 1 tablet (50 mg total) by mouth 2 (two) times daily. Qty: 60 tablet, Refills: 3      CONTINUE these medications which have NOT CHANGED   Details  acetaminophen (TYLENOL) 325 MG tablet Take 650 mg by mouth every 6 (six) hours as needed.    ranitidine (ZANTAC) 150 MG tablet Take 150 mg by mouth daily as needed for heartburn.        If you experience worsening of your admission symptoms, develop shortness of breath, life threatening emergency, suicidal or homicidal thoughts you must seek medical attention immediately by calling 911 or calling your MD immediately  if symptoms less severe.  You Must read complete instructions/literature along with all the possible adverse reactions/side effects for all the Medicines you take and that have been prescribed to you. Take any new Medicines after you have completely understood and accept all the possible adverse reactions/side effects.   Please note  You were cared for by a hospitalist during your hospital stay. If you have any questions about your discharge medications or the care you received while you were in the hospital after you are discharged, you can call the unit and asked to speak with the hospitalist on call if the hospitalist that took care of you is not available. Once you are discharged, your primary care physician will handle any further medical issues. Please note that NO REFILLS for any discharge medications will be authorized once you are discharged, as it is imperative that you return to your primary care physician (  or establish a relationship with a primary care physician if you do not have one) for your aftercare needs so that they can reassess your need for medications and monitor your lab values. Today   SUBJECTIVE   No complaints  VITAL SIGNS:  Blood pressure 123/81, pulse 97, temperature 97.6 F (36.4 C), temperature source Oral, resp. rate 16, height  (1.778 m), weight 91.491 kg (201 lb 11.2 oz), SpO2 99  %.  I/O:   Intake/Output Summary (Last 24 hours) at 10/01/15 0946 Last data filed at 10/01/15 0431  Gross per 24 hour  Intake    480 ml  Output   1600 ml  Net  -1120 ml    PHYSICAL EXAMINATION:  GENERAL:  50 y.o.-year-old patient lying in the bed with no acute distress.  EYES: Pupils equal, round, reactive to light and accommodation. No scleral icterus. Extraocular muscles intact.  HEENT: Head atraumatic, normocephalic. Oropharynx and nasopharynx clear.  NECK:  Supple, no jugular venous distention. No thyroid enlargement, no tenderness.  LUNGS: Normal breath sounds bilaterally, no wheezing, rales,rhonchi or crepitation. No use of accessory muscles of respiration.  CARDIOVASCULAR: S1, S2 normal. No murmurs, rubs, or gallops. Irregularly irregular ABDOMEN: Soft, non-tender, non-distended. Bowel sounds present. No organomegaly or mass.  EXTREMITIES: No pedal edema, cyanosis, or clubbing.  NEUROLOGIC: Cranial nerves II through XII are intact. Muscle strength 5/5 in all extremities. Sensation intact. Gait not checked.  PSYCHIATRIC: The patient is alert and oriented x 3.  SKIN: No obvious rash, lesion, or ulcer.   DATA REVIEW:   CBC   Recent Labs Lab 09/30/15 0256  WBC 9.0  HGB 15.8  HCT 45.7  PLT 156    Chemistries   Recent Labs Lab 09/29/15 1104 09/30/15 0256  NA 137  --   K 4.7  --   CL 107  --   CO2 23  --   GLUCOSE 96  --   BUN 19  --   CREATININE 0.65  --   CALCIUM 9.8  --   AST 39  --   ALT 41  --   ALKPHOS 81  --   BILITOT 1.9* 1.0    Microbiology Results   No results found for this or any previous visit (from the past 240 hour(s)).  RADIOLOGY:  Dg Chest 1 View  09/29/2015  CLINICAL DATA:  Headache, dizzyness, nausea, vomiting x today. Sob, afib. Nonsmoker. EXAM: CHEST  1 VIEW COMPARISON:  12/07/2008 FINDINGS: Lungs are clear. Heart size and mediastinal contours are within normal limits. No effusion. Visualized skeletal structures are unremarkable.  IMPRESSION: No acute cardiopulmonary disease. Electronically Signed   By: Corlis Leak M.D.   On: 09/29/2015 11:55   Ct Head Wo Contrast  09/29/2015  CLINICAL DATA:  Acute onset frontal headache, nausea, and dizziness beginning this morning. EXAM: CT HEAD WITHOUT CONTRAST TECHNIQUE: Contiguous axial images were obtained from the base of the skull through the vertex without intravenous contrast. COMPARISON:  12/08/2009 FINDINGS: No evidence of intracranial hemorrhage, brain edema, or other signs of acute infarction. No evidence of intracranial mass lesion or mass effect. No abnormal extraaxial fluid collections identified. Ventricles are normal in size. No skull abnormality identified. IMPRESSION: Negative noncontrast head CT. Electronically Signed   By: Myles Rosenthal M.D.   On: 09/29/2015 11:57   US Abdomen Limited Ruq  09/30/2015  CLINICAL DATA:  Elevated bili Rubin. Nausea and vomiting, 2 days duration. Recently ate breakfast. EXAM: US ABDOMEN LIMITED - RIGHT UPPER QUADRANT COMPARISON:  CT 04/06/2005 FINDINGS: Gallbladder: No gallstones or wall thickening visualized. No sonographic Murphy sign noted by sonographer. Common bile duct: Diameter: 3.2 mm, normal Liver: No focal lesion identified. Within normal limits in parenchymal echogenicity. IMPRESSION: Normal examination.  No hepatobiliary pathology evident. Electronically Signed   By: Paulina Fusi M.D.   On: 09/30/2015 10:51     Management plans discussed with the patient, family and they are in agreement.  CODE STATUS:     Code Status Orders        Start     Ordered   09/29/15 1831  Full code   Continuous     09/29/15 1830    Code Status History    Date Active Date Inactive Code Status Order ID Comments User Context   This patient has a current code status but no historical code status.      TOTAL TIME TAKING CARE OF THIS PATIENT: 40 minutes.    Leilani Cespedes M.D on 10/01/2015 at 9:46 AM  Between 7am to 6pm - Pager - [phone] After  6pm go to [email or web address] - password [email or web address] [facility]  [place] [facility]  Office  [phone]  CC: Primary care physician; No primary care provider on file.

## 2015-10-01 NOTE — Care Management (Signed)
Faxed prescription and med management application. Patient to pick up after discharge.

## 2015-10-01 NOTE — Telephone Encounter (Signed)
   Patient contacted [facility] on April 7.  Patient understands to follow up with provider Arida on May 11 at 2[facility]15pm [facility]. Patient understands discharge instructions? yes Patient understands medications and regiment? yes Patient understands to bring all medications to this visit? yes  Pt would like an appointment earlier in the day. Forward to scheduling

## 2015-10-01 NOTE — Care Management (Signed)
Email sent to [name] for [facility] appointment.

## 2015-10-01 NOTE — Care Management (Signed)
Eliquis coupon given for free 30 day trial. Patient has no completed medication management application. CM to follow up later and pick up medications for patient at medication management clinic for patient prior to discharge.

## 2015-10-01 NOTE — Progress Notes (Signed)
All d/c papers explained and given to pt. Pt verbalizes understanding. IV d/c intact   Telemetry d/c and notified central tele

## 2015-10-13 ENCOUNTER — Telehealth: Payer: Self-pay

## 2015-10-13 ENCOUNTER — Telehealth: Payer: Self-pay | Admitting: Cardiovascular Disease

## 2015-10-13 NOTE — Telephone Encounter (Signed)
Mr called, lm wants info on becoming [facility] [phone]

## 2015-10-13 NOTE — Telephone Encounter (Signed)
Pt c/o Shortness Of Breath: STAT if SOB developed within the last 24 hours or pt is noticeably SOB on the phone  1. Are you currently SOB (can you hear that pt is SOB on the phone)? yes  2. How long have you been experiencing SOB? Last week   3. Are you SOB when sitting or when up moving around?   All the time   4. Are you currently experiencing any other symptoms?    Feels like heart is beating fast   Walked into office wants nurse to check bp and heart rate

## 2015-10-13 NOTE — Telephone Encounter (Signed)
Pt presented to the office today requesting someone to assess him as he feels his heart rate is elevated and states he is SOB. Reports symptoms started "about a week ago" [facility] new onset afib. Pt has prescriptions for metoprolol and eliquis. He has a post hospital f/u  w/Dr. Kirke CorinArida on May 4 but would like to know if we can "check me out" today. During our conversation, he did not appear to be SOB while sitting and is able to carry on a conversation with me without appearing winded. He denies any other symptoms.  Offered pt appointment today at 1:30pm w/Ryan Dunn, PA-C. Pt declines stating he goes to work at [employer] but needs to go in earlier today and is unable to make this appt. Advised pt that if he feels symptoms need immediate attention, he should go to the ER for further evaluation. He states "I really just want my blood pressure and heart rate checked". He does not have BP cuff or HR monitor at home. Advised pt that most local pharmacies such as [facility], [facility], [facility], have automatic BP machines with which he can use. Pt verbalized understanding of all instructions and is appreciative of the information. Pt states he has no further questions.

## 2015-10-13 NOTE — Telephone Encounter (Signed)
See additional phone note. 

## 2015-10-22 ENCOUNTER — Ambulatory Visit: Payer: Self-pay

## 2015-10-26 ENCOUNTER — Emergency Department (HOSPITAL_COMMUNITY)
Admission: EM | Admit: 2015-10-26 | Discharge: 2015-10-26 | Disposition: A | Discharge status: Home / Self Care | Payer: Self-pay | Attending: Emergency Medicine | Admitting: Emergency Medicine

## 2015-10-26 ENCOUNTER — Encounter (HOSPITAL_COMMUNITY): Payer: Self-pay | Admitting: Emergency Medicine

## 2015-10-26 ENCOUNTER — Emergency Department (HOSPITAL_COMMUNITY): Payer: Self-pay

## 2015-10-26 ENCOUNTER — Telehealth: Payer: Self-pay

## 2015-10-26 DIAGNOSIS — K219 Gastro-esophageal reflux disease without esophagitis: Secondary | ICD-10-CM | POA: Insufficient documentation

## 2015-10-26 DIAGNOSIS — I482 Chronic atrial fibrillation, unspecified: Secondary | ICD-10-CM

## 2015-10-26 DIAGNOSIS — Z5900 Homelessness unspecified: Secondary | ICD-10-CM

## 2015-10-26 DIAGNOSIS — Z609 Problem related to social environment, unspecified: Secondary | ICD-10-CM | POA: Insufficient documentation

## 2015-10-26 DIAGNOSIS — Z79899 Other long term (current) drug therapy: Secondary | ICD-10-CM | POA: Insufficient documentation

## 2015-10-26 DIAGNOSIS — Z87442 Personal history of urinary calculi: Secondary | ICD-10-CM | POA: Insufficient documentation

## 2015-10-26 LAB — COMPREHENSIVE METABOLIC PANEL
ALT: 28 U/L (ref 17–63)
AST: 24 U/L (ref 15–41)
Albumin: 3.7 g/dL (ref 3.5–5.0)
Alkaline Phosphatase: 62 U/L (ref 38–126)
Anion gap: 9 (ref 5–15)
BILIRUBIN TOTAL: 0.8 mg/dL (ref 0.3–1.2)
BUN: 19 mg/dL (ref 6–20)
CHLORIDE: 108 mmol/L (ref 101–111)
CO2: 25 mmol/L (ref 22–32)
Calcium: 9.3 mg/dL (ref 8.9–10.3)
Creatinine, Ser: 1.01 mg/dL (ref 0.61–1.24)
Glucose, Bld: 97 mg/dL (ref 65–99)
POTASSIUM: 4.5 mmol/L (ref 3.5–5.1)
Sodium: 142 mmol/L (ref 135–145)
TOTAL PROTEIN: 6.5 g/dL (ref 6.5–8.1)

## 2015-10-26 LAB — CBC
HEMATOCRIT: 42.2 % (ref 39.0–52.0)
Hemoglobin: 14.6 g/dL (ref 13.0–17.0)
MCH: 30.2 pg (ref 26.0–34.0)
MCHC: 34.6 g/dL (ref 30.0–36.0)
MCV: 87.2 fL (ref 78.0–100.0)
PLATELETS: 179 10*3/uL (ref 150–400)
RBC: 4.84 MIL/uL (ref 4.22–5.81)
RDW: 12.8 % (ref 11.5–15.5)
WBC: 8.3 10*3/uL (ref 4.0–10.5)

## 2015-10-26 LAB — PROTIME-INR
INR: 1.26 (ref 0.00–1.49)
PROTHROMBIN TIME: 15.9 s — AB (ref 11.6–15.2)

## 2015-10-26 LAB — TROPONIN I: Troponin I: <0.03 ng/mL (ref <0.031)

## 2015-10-26 NOTE — Progress Notes (Signed)
[facility].  [facility] with his medications.  Discussed patient with EDP.  No further EDCM needs at this time.

## 2015-10-26 NOTE — ED Notes (Signed)
Per EMS, [facility] [place] [employer] [place] he had a sudden onset of chest pressure, rating it 8/10.  Patient denied radiation to EMS.   Patient states he did have some dizziness, headache and SOB at the same time.   Patient was aflutter on the EKG ran by EMS.   Patient was newly diagnosed with AFIB 2-3 weeks ago and placed on metoprolol and eloquis.  EMS placed a 20 G LAC, gave 324mg  of ASA PO, and patient refused NTG per EMS recommendation.   Vitals for EMS P80, BP 125/85, R18, O2 96% RA.

## 2015-10-26 NOTE — Progress Notes (Signed)
[facility] for Elequis and lopressor.  Patient is homeless, no pcp no insurance.  EDCM placed phone call to Jane CM at [facility] in attempts to make patient an appointment and to [facility].  Awaiting call back.  Discussed patient with EDP.

## 2015-10-26 NOTE — ED Provider Notes (Addendum)
CSN: [id number]     Arrival date & time 10/26/15  1301 History   First MD Initiated Contact with Patient 10/26/15 1302     Chief Complaint  Patient presents with  . Chest Pain   PT HERE WITH CP.  THE PT WAS ADMITTED AT [facility] ON  4/5.  HE WAS DX'D WITH NEW ONSET A. FIB WITH RVR.  HIS EF WAS 30-35%.  HE HAS AN APPT WITH CARDS ON 5/4.  THE PT SAID THAT HE IS HOMELESS AND WAS AT THE LIBRARY WHEN SX STARTED.  EMS GAVE HIM ASA.  HE REFUSED NITRO.  (Consider location/radiation/quality/duration/timing/severity/associated sxs/prior Treatment) The history is provided by the patient.    Past Medical History  Diagnosis Date  . GERD (gastroesophageal reflux disease)   . Kidney stone   . Atrial fibrillation (HCC)     a. initially diagnosed in 09/2015. Reported a history of "irregular HR" for 3+ years   History reviewed. No pertinent past surgical history. Family History  Problem Relation Age of Onset  . Heart attack Father     Initial MI age 64, deceased in his 21's from a massive MI  . Heart attack Maternal Aunt 11   Social History  Substance Use Topics  . Smoking status: Never Smoker   . Smokeless tobacco: None  . Alcohol Use: No    Review of Systems  Cardiovascular: Positive for chest pain.  All other systems reviewed and are negative.     Allergies  Nitroglycerin  Home Medications   Prior to Admission medications   Medication Sig Start Date End Date Taking? Authorizing Provider  acetaminophen (TYLENOL) 325 MG tablet Take 650 mg by mouth every 6 (six) hours as needed.   Yes Historical Provider, MD  apixaban (ELIQUIS) 5 MG TABS tablet Take 1 tablet (5 mg total) by mouth 2 (two) times daily. 10/01/15  Yes Enedina Finner, MD  metoprolol (LOPRESSOR) 50 MG tablet Take 1 tablet (50 mg total) by mouth 2 (two) times daily. 10/01/15  Yes Enedina Finner, MD  ranitidine (ZANTAC) 150 MG tablet Take 150 mg by mouth daily as needed for heartburn.   Yes Historical Provider, MD   BP 132/90 mmHg  Pulse  74  Temp(Src) 98.3 F (36.8 C) (Oral)  Resp 15  Ht  (1.778 m)  Wt 201 lb (91.173 kg)  BMI 28.84 kg/m2  SpO2 100% Physical Exam  Constitutional: He is oriented to person, place, and time. He appears well-developed and well-nourished.  HENT:  Head: Normocephalic and atraumatic.  Right Ear: External ear normal.  Left Ear: External ear normal.  Nose: Nose normal.  Mouth/Throat: Oropharynx is clear and moist.  Eyes: Conjunctivae and EOM are normal. Pupils are equal, round, and reactive to light.  Neck: Normal range of motion. Neck supple.  Cardiovascular: Normal rate.  An irregularly irregular rhythm present.  Pulmonary/Chest: Effort normal and breath sounds normal.  Abdominal: Soft. Bowel sounds are normal.  Musculoskeletal: Normal range of motion.  Neurological: He is alert and oriented to person, place, and time. He has normal reflexes.  Skin: Skin is warm and dry.  Psychiatric: He has a normal mood and affect. His behavior is normal. Judgment and thought content normal.  Nursing note and vitals reviewed.   ED Course  Procedures (including critical care time) Labs Review Labs Reviewed  PROTIME-INR - Abnormal; Notable for the following:    Prothrombin Time 15.9 (*)    All other components within normal limits  CBC  COMPREHENSIVE METABOLIC  PANEL  TROPONIN I    Imaging Review Dg Chest 2 View  10/26/2015  CLINICAL DATA:  Chest pain. EXAM: CHEST  2 VIEW COMPARISON:  09/29/2015 FINDINGS: The heart size and mediastinal contours are within normal limits. Both lungs are clear. No pleural effusion or pneumothorax. The visualized skeletal structures are unremarkable. IMPRESSION: No active cardiopulmonary disease. Electronically Signed   By: Amie Portlandavid  Ormond M.D.   On: 10/26/2015 13:42   I have personally reviewed and evaluated these images and lab results as part of my medical decision-making.   EKG Interpretation   Date/Time:  Tuesday Oct 26 2015 13:10:01 EDT Ventricular Rate:   94 PR Interval:    QRS Duration: 134 QT Interval:  372 QTC Calculation: 465 R Axis:   54 Text Interpretation:  Atrial fibrillation Nonspecific intraventricular  conduction delay Confirmed by Anett Ranker MD, Lamari Youngers ([id number]) on 10/26/2015  1:50:00 PM      MDM  PT REPORTS THAT HE IS ALMOST OUT OF HIS MEDS.  I ASKED THE CASE MANAGER TO CONSULT ON PT.  AMY FERRERO IS TRYING TO GET PT HIS MEDS AND AN APPT WITH A PCP.  SHE WAS ABLE TO GET HIM AN APPT WITH A PROVIDER WITH THE [facility] ON MAY 9.  THE PT IS SUPPOSED TO GO THERE TOMORROW  TO MANAGE MEDS. [facility] SCORE: 1.  I CONTINUED PT ON ELIQUIS SO HE COULD BE POTENTIALLY CARDIOVERTED BY HIS CARDIOLOGIST. Final diagnoses:  Chronic atrial fibrillation [facility](HCC)        Jacalyn LefevreJulie Meryl Ponder, MD 10/27/15 1329  Jacalyn LefevreJulie Georganne Siple, MD 11/02/15 0002

## 2015-10-26 NOTE — Discharge Instructions (Signed)
Atrial Fibrillation °Atrial fibrillation is a type of heartbeat that is irregular or fast (rapid). If you have this condition, your heart keeps quivering in a weird (chaotic) way. This condition can make it so your heart cannot pump blood normally. Having this condition gives a person more risk for stroke, heart failure, and other heart problems. There are different types of atrial fibrillation. Talk with your doctor to learn about the type that you have. °HOME CARE °· Take over-the-counter and prescription medicines only as told by your doctor. °· If your doctor prescribed a blood-thinning medicine, take it exactly as told. Taking too much of it can cause bleeding. If you do not take enough of it, you will not have the protection that you need against stroke and other problems. °· Do not use any tobacco products. These include cigarettes, chewing tobacco, and e-cigarettes. If you need help quitting, ask your doctor. °· If you have apnea (obstructive sleep apnea), manage it as told by your doctor. °· Do not drink alcohol. °· Do not drink beverages that have caffeine. These include coffee, soda, and tea. °· Maintain a healthy weight. Do not use diet pills unless your doctor says they are safe for you. Diet pills may make heart problems worse. °· Follow diet instructions as told by your doctor. °· Exercise regularly as told by your doctor. °· Keep all follow-up visits as told by your doctor. This is important. °GET HELP IF: °· You notice a change in the speed, rhythm, or strength of your heartbeat. °· You are taking a blood-thinning medicine and you notice more bruising. °· You get tired more easily when you move or exercise. °GET HELP RIGHT AWAY IF: °· You have pain in your chest or your belly (abdomen). °· You have sweating or weakness. °· You feel sick to your stomach (nauseous). °· You notice blood in your throw up (vomit), poop (stool), or pee (urine). °· You are short of breath. °· You suddenly have swollen feet  and ankles. °· You feel dizzy. °· Your suddenly get weak or numb in your face, arms, or legs, especially if it happens on one side of your body. °· You have trouble talking, trouble understanding, or both. °· Your face or your eyelid droops on one side. °These symptoms may be an emergency. Do not wait to see if the symptoms will go away. Get medical help right away. Call your local emergency services (911 in the [place] Do not drive yourself to the hospital. °  °This information is not intended to replace advice given to you by your health care provider. Make sure you discuss any questions you have with your health care provider. °  °Document Released: 03/21/2008 Document Revised: 03/03/2015 Document Reviewed: 10/07/2014 °Elsevier Interactive Patient Education ©2016 [employer] Inc[employer] ° °

## 2015-10-26 NOTE — Telephone Encounter (Signed)
Message received from Radford PaxAmy Ferrero, RN CM requesting a hospital [facility] and inquiring about filling a prescription for eliquis. He is homeless and has no insurance at this time.  As per Cheron EverySajal Shah, [facility] Pharmacist, the patient can obtain a 30 day trial eliqiuis with the eliquis card and the pharmacy can help him with complete an application for medication assistance.   An appointment was scheduled for 11/03/15 @ 0900 and the information was placed on the AVS.  Update provided to A. Bennie DallasFerrero, RN CM

## 2015-10-27 ENCOUNTER — Ambulatory Visit: Payer: Self-pay

## 2015-10-28 ENCOUNTER — Encounter: Payer: Self-pay | Admitting: Cardiovascular Disease

## 2015-10-28 ENCOUNTER — Ambulatory Visit (INDEPENDENT_AMBULATORY_CARE_PROVIDER_SITE_OTHER): Payer: Self-pay | Admitting: Cardiovascular Disease

## 2015-10-28 ENCOUNTER — Telehealth: Payer: Self-pay | Admitting: Cardiovascular Disease

## 2015-10-28 ENCOUNTER — Ambulatory Visit: Payer: Self-pay

## 2015-10-28 VITALS — BP 110/80 | HR 86 | Ht 70.0 in | Wt 207.8 lb

## 2015-10-28 DIAGNOSIS — Z01812 Encounter for preprocedural laboratory examination: Secondary | ICD-10-CM

## 2015-10-28 DIAGNOSIS — I4891 Unspecified atrial fibrillation: Secondary | ICD-10-CM

## 2015-10-28 NOTE — Progress Notes (Signed)
  Cardiology Office Note   Date:  10/28/2015   ID:  Rickey Lane, DOB 03/31/1966, MRN [id number]  PCP:  Pcp Not In System  Cardiologist:   Ceola Para, MD   Chief Complaint  Patient presents with  . other    Follow up from [facility]; A-Fib. Meds reviewed by the patient verbally. "doing well."       History of Present Illness: Rickey Lane is a 50 y.o. male who presents for A follow-up visit regarding persistent atrial fibrillation after recent hospitalization. He presented with fatigue and headache recently to [facility]. He was found to be in atrial fibrillation with rapid ventricular response. The patient was not aware how long he was in atrial fibrillation. He was treated with rate control and started on anticoagulation with Eliquis. He underwent an echocardiogram which showed moderately reduced LV systolic function with an ejection fraction of 35-40% with global hypokinesis and mildly dilated right and left atrium. Since hospital discharge, he has been feeling better with resolution of headache. He continues to complain of exertional dyspnea without chest pain. No orthopnea or PND. No significant leg edema. He is homeless and he moved recently to the homeless shelter in [place].    Past Medical History  Diagnosis Date  . GERD (gastroesophageal reflux disease)   . Kidney stone   . Atrial fibrillation (HCC)     a. initially diagnosed in 09/2015. Reported a history of "irregular HR" for 3+ years    History reviewed. No pertinent past surgical history.   Current Outpatient Prescriptions  Medication Sig Dispense Refill  . acetaminophen (TYLENOL) 325 MG tablet Take 650 mg by mouth every 6 (six) hours as needed.    . apixaban (ELIQUIS) 5 MG TABS tablet Take 1 tablet (5 mg total) by mouth 2 (two) times daily. 60 tablet 3  . metoprolol (LOPRESSOR) 50 MG tablet Take 1 tablet (50 mg total) by mouth 2 (two) times daily. 60 tablet 3  . ranitidine (ZANTAC) 150 MG tablet  Take 150 mg by mouth daily as needed for heartburn.     No current facility-administered medications for this visit.    Allergies:   Nitroglycerin    Social History:  The patient  reports that he has never smoked. He does not have any smokeless tobacco history on file. He reports that he does not drink alcohol or use illicit drugs.   Family History:  The patient's family history includes Heart attack in his father; Heart attack (age of onset: 55) in his maternal aunt.    ROS:  Please see the history of present illness.   Otherwise, review of systems are positive for none.   All other systems are reviewed and negative.    PHYSICAL EXAM: VS:  BP 110/80 mmHg  Pulse 86  Ht 5' 10" (1.778 m)  Wt 207 lb 12 oz (94.235 kg)  BMI 29.81 kg/m2 , BMI Body mass index is 29.81 kg/(m^2). GEN: Well nourished, well developed, in no acute distress HEENT: normal Neck: no JVD, carotid bruits, or masses Cardiac: Irregularly irregular; no murmurs, rubs, or gallops,no edema  Respiratory:  clear to auscultation bilaterally, normal work of breathing GI: soft, nontender, nondistended, + BS MS: no deformity or atrophy Skin: warm and dry, no rash Neuro:  Strength and sensation are intact Psych: euthymic mood, full affect   EKG:  EKG is ordered today. The ekg ordered today demonstrates atrial fibrillation with ventricular rate of 86 bpm.   Recent Labs: 09/29/2015: B Natriuretic   Peptide 45.0 09/30/2015: TSH 2.291 10/26/2015: ALT 28; BUN 19; Creatinine, Ser 1.01; Hemoglobin 14.6; Platelets 179; Potassium 4.5; Sodium 142    Lipid Panel    Component Value Date/Time   CHOL 152 09/30/2015 0256   TRIG 62 09/30/2015 0256   HDL 51 09/30/2015 0256   CHOLHDL 3.0 09/30/2015 0256   VLDL 12 09/30/2015 0256   LDLCALC 89 09/30/2015 0256      Wt Readings from Last 3 Encounters:  10/28/15 207 lb 12 oz (94.235 kg)  10/26/15 201 lb (91.173 kg)  09/30/15 201 lb 11.2 oz (91.491 kg)       ASSESSMENT AND  PLAN:  1.  Persistent atrial fibrillation: Ventricular rate is reasonably controlled on metoprolol. He has been taking anticoagulation regularly. I recommend proceeding with cardioversion next week. I discussed risks and benefits with him.  2. 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 He has no evidence of volume overload. I am hoping that his ejection fraction is going to improve with restoration of normal sinus rhythm or at least with good rate control. His blood pressure is relatively low and thus I did not add an ACE inhibitor.    Disposition:   FU with me in 1 month  Signed,  Lorine BearsMuhammad Rimsha Trembley, MD  10/28/2015 11:35 AM    [facility]

## 2015-10-28 NOTE — Patient Instructions (Addendum)
Medication Instructions:  Your physician recommends that you continue on your current medications as directed. Please refer to the Current Medication list given to you today.   Labwork: none  Testing/Procedures: Your physician has recommended that you have a Cardioversion (DCCV). Electrical Cardioversion uses a jolt of electricity to your heart either through paddles or wired patches attached to your chest. This is a controlled, usually prescheduled, procedure. Defibrillation is done under light anesthesia in the hospital, and you usually go home the day of the procedure. This is done to get your heart back into a normal rhythm. You are not awake for the procedure. Please see the instruction sheet given to you today.  You are scheduled for a Cardioversion on ________________ with Dr.___________ [facility] at _________ a.m. on the day of your procedure.  DIET INSTRUCTIONS:  Nothing to eat or drink after midnight except your medications with a sip of water.         1) Labs collected May 2  2) Medications:  YOU MAY TAKE ALL of your remaining medications with a small amount of water.  3) Must have a responsible person to drive you home.  4) Bring a current list of your medications and current insurance cards.    If you have any questions after you get home, please call the office at [phone]   Follow-Up: Your physician recommends that you schedule a follow-up appointment after cardioversion.   Any Other Special Instructions Will Be Listed Below (If Applicable).     If you need a refill on your cardiac medications before your next appointment, please call your pharmacy.

## 2015-10-28 NOTE — Telephone Encounter (Signed)
Scheduled DCCV May 11, 7:30am, [facility] S/w pt who states [place] stay in [place] the evening before with a friend who will provide transportation. Reviewed instructions. Pt agreeable w/plan with no further questions.

## 2015-10-29 ENCOUNTER — Telehealth: Payer: Self-pay | Admitting: Cardiovascular Disease

## 2015-10-29 NOTE — Telephone Encounter (Signed)
[facility] paperwork completed and left at front desk for pick up. Notified Rhetta MuraAnnette Johnson, [phone], via VM that it's ready.

## 2015-10-29 NOTE — Telephone Encounter (Signed)
[facility] paperwork for eliquis in MD basket for signature

## 2015-11-01 ENCOUNTER — Telehealth: Payer: Self-pay

## 2015-11-01 NOTE — Telephone Encounter (Signed)
Attempted to contact the patient to confirm his appointment at [facility] for 11/03/15 @ 0900. Call placed to # [phone] (M) and a HIPAA compliant voice mail message was left requesting a call back to # [phone] or [phone].

## 2015-11-02 ENCOUNTER — Telehealth: Payer: Self-pay

## 2015-11-02 NOTE — Telephone Encounter (Signed)
Call placed to the patient to confirm his appointment for tomorrow, 11/03/15 @ [facility]. He stated that he has moved out of the area and needs to cancel the appointment.  He said that he has all of his medications and will need to find a doctor in the area where he lives.

## 2015-11-03 ENCOUNTER — Telehealth: Payer: Self-pay | Admitting: Cardiovascular Disease

## 2015-11-03 ENCOUNTER — Inpatient Hospital Stay: Payer: Self-pay | Admitting: Internal Medicine

## 2015-11-03 NOTE — Telephone Encounter (Signed)
Reviewed DCCV instructions w/pt who verbalized understanding of date, time, location, instructions, and medications. Pt states he is staying w/a friend in [place] tonight and the friend will bring him to hospital tomorrow for procedure. [place] [place]. Confirmed 5/19 f/u appt w/Dr. Kirke CorinArida. Pt had no further questions.

## 2015-11-04 ENCOUNTER — Ambulatory Visit
Admission: RE | Admit: 2015-11-04 | Discharge: 2015-11-04 | Disposition: A | Discharge status: Home / Self Care | Payer: Self-pay | Source: Ambulatory Visit | Attending: Cardiovascular Disease | Admitting: Cardiovascular Disease

## 2015-11-04 ENCOUNTER — Encounter: Payer: Self-pay | Admitting: *Deleted

## 2015-11-04 ENCOUNTER — Encounter: Payer: Self-pay | Admitting: Cardiovascular Disease

## 2015-11-04 ENCOUNTER — Encounter
Admission: RE | Disposition: A | Discharge status: Home / Self Care | Payer: Self-pay | Source: Ambulatory Visit | Attending: Cardiovascular Disease

## 2015-11-04 ENCOUNTER — Ambulatory Visit: Payer: Self-pay | Admitting: Anesthesiology

## 2015-11-04 DIAGNOSIS — I5022 Chronic systolic (congestive) heart failure: Secondary | ICD-10-CM | POA: Insufficient documentation

## 2015-11-04 DIAGNOSIS — Z87442 Personal history of urinary calculi: Secondary | ICD-10-CM | POA: Insufficient documentation

## 2015-11-04 DIAGNOSIS — Z7901 Long term (current) use of anticoagulants: Secondary | ICD-10-CM | POA: Insufficient documentation

## 2015-11-04 DIAGNOSIS — Z888 Allergy status to other drugs, medicaments and biological substances status: Secondary | ICD-10-CM | POA: Insufficient documentation

## 2015-11-04 DIAGNOSIS — I481 Persistent atrial fibrillation: Secondary | ICD-10-CM

## 2015-11-04 DIAGNOSIS — Z590 Homelessness: Secondary | ICD-10-CM | POA: Insufficient documentation

## 2015-11-04 DIAGNOSIS — K219 Gastro-esophageal reflux disease without esophagitis: Secondary | ICD-10-CM | POA: Insufficient documentation

## 2015-11-04 DIAGNOSIS — Z79899 Other long term (current) drug therapy: Secondary | ICD-10-CM | POA: Insufficient documentation

## 2015-11-04 DIAGNOSIS — Z01810 Encounter for preprocedural cardiovascular examination: Secondary | ICD-10-CM

## 2015-11-04 DIAGNOSIS — I4819 Other persistent atrial fibrillation: Secondary | ICD-10-CM | POA: Insufficient documentation

## 2015-11-04 DIAGNOSIS — Z8249 Family history of ischemic heart disease and other diseases of the circulatory system: Secondary | ICD-10-CM | POA: Insufficient documentation

## 2015-11-04 HISTORY — DX: Headache: R51

## 2015-11-04 HISTORY — DX: Cardiac arrhythmia, unspecified: I49.9

## 2015-11-04 HISTORY — DX: Headache, unspecified: R51.9

## 2015-11-04 HISTORY — PX: ELECTROPHYSIOLOGIC STUDY: SHX172A

## 2015-11-04 SURGERY — CARDIOVERSION (CATH LAB)
Anesthesia: General

## 2015-11-04 MED ORDER — SODIUM CHLORIDE 0.9 % IV SOLN
INTRAVENOUS | Status: DC
  Administered 2015-11-04: 07:00:00 via INTRAVENOUS

## 2015-11-04 MED ORDER — PROPOFOL 10 MG/ML IV BOLUS
INTRAVENOUS | Status: DC | PRN
  Administered 2015-11-04: 90 mg via INTRAVENOUS

## 2015-11-04 NOTE — H&P (View-Only) (Signed)
Cardiology Office Note   Date:  10/28/2015   ID:  Rickey Lane, DOB [birth year], MRN [id number]  PCP:  Pcp Not In System  Cardiologist:   Lorine Bears, MD   Chief Complaint  Patient presents with  . other    Follow up from [facility]; A-Fib. Meds reviewed by the patient verbally. "doing well."       History of Present Illness: Rickey Lane is a 50 y.o. male who presents for A follow-up visit regarding persistent atrial fibrillation after recent hospitalization. He presented with fatigue and headache recently to [facility]. He was found to be in atrial fibrillation with rapid ventricular response. The patient was not aware how long he was in atrial fibrillation. He was treated with rate control and started on anticoagulation with Eliquis. He underwent an echocardiogram which showed moderately reduced LV systolic function with an ejection fraction of 35-40% with global hypokinesis and mildly dilated right and left atrium. Since hospital discharge, he has been feeling better with resolution of headache. He continues to complain of exertional dyspnea without chest pain. No orthopnea or PND. No significant leg edema. He is homeless and he moved recently to the homeless shelter in [place].    Past Medical History  Diagnosis Date  . GERD (gastroesophageal reflux disease)   . Kidney stone   . Atrial fibrillation (HCC)     a. initially diagnosed in 09/2015. Reported a history of "irregular HR" for 3+ years    History reviewed. No pertinent past surgical history.   Current Outpatient Prescriptions  Medication Sig Dispense Refill  . acetaminophen (TYLENOL) 325 MG tablet Take 650 mg by mouth every 6 (six) hours as needed.    Marland Kitchen apixaban (ELIQUIS) 5 MG TABS tablet Take 1 tablet (5 mg total) by mouth 2 (two) times daily. 60 tablet 3  . metoprolol (LOPRESSOR) 50 MG tablet Take 1 tablet (50 mg total) by mouth 2 (two) times daily. 60 tablet 3  . ranitidine (ZANTAC) 150 MG tablet  Take 150 mg by mouth daily as needed for heartburn.     No current facility-administered medications for this visit.    Allergies:   Nitroglycerin    Social History:  The patient  reports that he has never smoked. He does not have any smokeless tobacco history on file. He reports that he does not drink alcohol or use illicit drugs.   Family History:  The patient's family history includes Heart attack in his father; Heart attack (age of onset: 23) in his maternal aunt.    ROS:  Please see the history of present illness.   Otherwise, review of systems are positive for none.   All other systems are reviewed and negative.    PHYSICAL EXAM: VS:  BP 110/80 mmHg  Pulse 86  Ht  (1.778 m)  Wt 207 lb 12 oz (94.235 kg)  BMI 29.81 kg/m2 , BMI Body mass index is 29.81 kg/(m^2). GEN: Well nourished, well developed, in no acute distress HEENT: normal Neck: no JVD, carotid bruits, or masses Cardiac: Irregularly irregular; no murmurs, rubs, or gallops,no edema  Respiratory:  clear to auscultation bilaterally, normal work of breathing GI: soft, nontender, nondistended, + BS MS: no deformity or atrophy Skin: warm and dry, no rash Neuro:  Strength and sensation are intact Psych: euthymic mood, full affect   EKG:  EKG is ordered today. The ekg ordered today demonstrates atrial fibrillation with ventricular rate of 86 bpm.   Recent Labs: 09/29/2015: B Natriuretic  Peptide 45.0 09/30/2015: TSH 2.291 10/26/2015: ALT 28; BUN 19; Creatinine, Ser 1.01; Hemoglobin 14.6; Platelets 179; Potassium 4.5; Sodium 142    Lipid Panel    Component Value Date/Time   CHOL 152 09/30/2015 0256   TRIG 62 09/30/2015 0256   HDL 51 09/30/2015 0256   CHOLHDL 3.0 09/30/2015 0256   VLDL 12 09/30/2015 0256   LDLCALC 89 09/30/2015 0256      Wt Readings from Last 3 Encounters:  10/28/15 207 lb 12 oz (94.235 kg)  10/26/15 201 lb (91.173 kg)  09/30/15 201 lb 11.2 oz (91.491 kg)       ASSESSMENT AND  PLAN:  1.  Persistent atrial fibrillation: Ventricular rate is reasonably controlled on metoprolol. He has been taking anticoagulation regularly. I recommend proceeding with cardioversion next week. I discussed risks and benefits with him.  2. 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 He has no evidence of volume overload. I am hoping that his ejection fraction is going to improve with restoration of normal sinus rhythm or at least with good rate control. His blood pressure is relatively low and thus I did not add an ACE inhibitor.    Disposition:   FU with me in 1 month  Signed,  Lorine BearsMuhammad Roark Rufo, MD  10/28/2015 11:35 AM    [facility]

## 2015-11-04 NOTE — Transfer of Care (Signed)
Immediate Anesthesia Transfer of Care Note  Patient: Rickey LarocheChristopher J Finamore  Procedure(s) Performed: Procedure(s): CARDIOVERSION (N/A)  Patient Location: PACU and Radiology  Anesthesia Type:General  Level of Consciousness: awake, alert  and oriented  Airway & Oxygen Therapy: Patient Spontanous Breathing and Patient connected to nasal cannula oxygen  Post-op Assessment: Report given to RN and Post -op Vital signs reviewed and stable  Post vital signs: Reviewed and stable  Last Vitals:  Filed Vitals:   11/04/15 0651 11/04/15 0748  BP: 124/71 102/65  Pulse: 69 68  Temp: 36.5 C   Resp: 18 16    Last Pain: There were no vitals filed for this visit.       Complications: No apparent anesthesia complications

## 2015-11-04 NOTE — Interval H&P Note (Signed)
History and Physical Interval Note:  11/04/2015 7:51 AM  Rickey Lane  has presented today for surgery, with the diagnosis of Afib  The various methods of treatment have been discussed with the patient and family. After consideration of risks, benefits and other options for treatment, the patient has consented to  Procedure(s): CARDIOVERSION (N/A) as a surgical intervention .  The patient's history has been reviewed, patient examined, no change in status, stable for surgery.  I have reviewed the patient's chart and labs.  Questions were answered to the patient's satisfaction.     Lorine BearsMuhammad Mehtab Dolberry

## 2015-11-04 NOTE — Anesthesia Preprocedure Evaluation (Signed)
Anesthesia Evaluation  Patient identified by MRN, date of birth, ID band Patient awake    Reviewed: Allergy & Precautions, H&P , NPO status , Patient's Chart, lab work & pertinent test results, reviewed documented beta blocker date and time   History of Anesthesia Complications Negative for: history of anesthetic complications  Airway Mallampati: II  TM Distance: >3 FB Neck ROM: full  Mouth opening: Limited Mouth Opening  Dental no notable dental hx. (+) Teeth Intact   Pulmonary shortness of breath, neg sleep apnea, neg COPD, neg recent URI,    Pulmonary exam normal breath sounds clear to auscultation       Cardiovascular Exercise Tolerance: Good hypertension, On Home Beta Blockers (-) angina(-) CAD, (-) Past MI, (-) Cardiac Stents and (-) CABG Normal cardiovascular exam+ dysrhythmias Atrial Fibrillation (-) Valvular Problems/Murmurs Rhythm:regular Rate:Normal     Neuro/Psych negative neurological ROS  negative psych ROS   GI/Hepatic Neg liver ROS, GERD  ,  Endo/Other  negative endocrine ROS  Renal/GU Renal disease (kidney stones)  negative genitourinary   Musculoskeletal   Abdominal   Peds  Hematology negative hematology ROS (+)   Anesthesia Other Findings Past Medical History:   GERD (gastroesophageal reflux disease)                       Kidney stone                                                 Atrial fibrillation (HCC)                                      Comment:a. initially diagnosed in 09/2015. Reported a               history of "irregular HR" for 3+ years   Hypertension                                                 Dysrhythmia                                                  Headache                                                     Reproductive/Obstetrics negative OB ROS                             Anesthesia Physical Anesthesia Plan  ASA: II  Anesthesia Plan:  General   Post-op Pain Management:    Induction:   Airway Management Planned:   Additional Equipment:   Intra-op Plan:   Post-operative Plan:   Informed Consent: I have reviewed the patients History and Physical, chart, labs and discussed the procedure including the risks, benefits and alternatives for the proposed anesthesia  with the patient or authorized representative who has indicated his/her understanding and acceptance.   Dental Advisory Given  Plan Discussed with: Anesthesiologist, CRNA and Surgeon  Anesthesia Plan Comments:         Anesthesia Quick Evaluation

## 2015-11-04 NOTE — CV Procedure (Signed)
Cardioversion note: A standard informed consent was obtained. Timeout was performed. The pads were placed in the anterior posterior fashion. The patient was given propofol by the anesthesia team.  Successful cardioversion was performed with a 120 J. The patient converted to sinus rhythm. Pre-and post EKGs were reviewed. The patient tolerated the procedure with no immediate complications.  Recommendations: Continue same medications and follow-up in 2-3 weeks.  

## 2015-11-04 NOTE — Anesthesia Postprocedure Evaluation (Signed)
Anesthesia Post Note  Patient: Rickey LarocheChristopher J Lane  Procedure(s) Performed: Procedure(s) (LRB): CARDIOVERSION (N/A)  Patient location during evaluation: Radiology Anesthesia Type: General Level of consciousness: awake and alert and oriented Pain management: pain level controlled Vital Signs Assessment: post-procedure vital signs reviewed and stable Respiratory status: spontaneous breathing Cardiovascular status: stable Anesthetic complications: no    Last Vitals:  Filed Vitals:   11/04/15 0748 11/04/15 0754  BP: 102/65 104/64  Pulse: 68 62  Temp:    Resp: 16 15    Last Pain: There were no vitals filed for this visit.               Zachary GeorgeWeatherly,  Jousha Schwandt F

## 2015-11-08 ENCOUNTER — Ambulatory Visit: Payer: Self-pay

## 2015-11-12 ENCOUNTER — Encounter: Payer: Self-pay | Admitting: Cardiovascular Disease

## 2015-11-12 ENCOUNTER — Encounter (INDEPENDENT_AMBULATORY_CARE_PROVIDER_SITE_OTHER): Payer: Self-pay

## 2015-11-12 ENCOUNTER — Telehealth: Payer: Self-pay | Admitting: Cardiovascular Disease

## 2015-11-12 ENCOUNTER — Ambulatory Visit (INDEPENDENT_AMBULATORY_CARE_PROVIDER_SITE_OTHER): Payer: Self-pay | Admitting: Cardiovascular Disease

## 2015-11-12 VITALS — BP 110/78 | HR 79 | Ht 70.0 in | Wt 209.5 lb

## 2015-11-12 DIAGNOSIS — I4891 Unspecified atrial fibrillation: Secondary | ICD-10-CM

## 2015-11-12 MED ORDER — LISINOPRIL 5 MG PO TABS: 5.0000 mg | ORAL_TABLET | Freq: Every day | ORAL | Status: DC

## 2015-11-12 MED ORDER — APIXABAN 5 MG PO TABS: 5.0000 mg | ORAL_TABLET | Freq: Two times a day (BID) | ORAL | Status: DC

## 2015-11-12 MED ORDER — DABIGATRAN ETEXILATE MESYLATE 150 MG PO CAPS: 150.0000 mg | ORAL_CAPSULE | Freq: Two times a day (BID) | ORAL | Status: DC

## 2015-11-12 MED ORDER — METOPROLOL TARTRATE 50 MG PO TABS: 50.0000 mg | ORAL_TABLET | Freq: Two times a day (BID) | ORAL | Status: DC

## 2015-11-12 NOTE — Telephone Encounter (Signed)
Spoke with Dr. Kirke CorinArida and changed eliquis to pradaxa and notified pharmacy of change as well. New orders placed and pharmacy aware.

## 2015-11-12 NOTE — Telephone Encounter (Signed)
Pharmacist calling regarding Eliquis, states they do not have this available. Please call and advise if there is a substitute.

## 2015-11-12 NOTE — Telephone Encounter (Signed)
Pharmacy does not carry Eliquis. The only options they have are Pradaxa or warfarin. Please advise if these are an option

## 2015-11-12 NOTE — Addendum Note (Signed)
Addended by: Bryna ColanderALLEN, Tanganika Barradas S on: 11/12/2015 12:04 PM   Modules accepted: Orders, Medications

## 2015-11-12 NOTE — Progress Notes (Signed)
Cardiology Office Note   Date:  11/12/2015   ID:  Rickey LarocheChristopher J Brimley, DOB 10/14/1965, MRN [id number]  PCP:  No PCP Per Patient  Cardiologist:   Lorine BearsMuhammad Arida, MD   Chief Complaint  Patient presents with  . other    follow up from the Cardioversion. Meds reviewed by the patient verbally. "doing well."       History of Present Illness: Rickey Lane is a 50 y.o. male who presents for A follow-up visit regarding persistent atrial fibrillation after recent cardioversion. He was hospitalized in April at [facility] for atrial fibrillation with rapid ventricular response. He was treated with rate control and started on anticoagulation with Eliquis. He underwent an echocardiogram which showed moderately reduced LV systolic function with an ejection fraction of 35-40% with global hypokinesis and mildly dilated right and left atrium. He underwent successful cardioversion on May 11 without complications.  He did not feel significant change after cardioversion and he is noted today to be back in atrial fibrillation.  He is homeless and he [place] recently [place]    Past Medical History  Diagnosis Date  . GERD (gastroesophageal reflux disease)   . Kidney stone   . Atrial fibrillation (HCC)     a. initially diagnosed in 09/2015. Reported a history of "irregular HR" for 3+ years  . Hypertension   . Dysrhythmia   . Headache     Past Surgical History  Procedure Laterality Date  . Electrophysiologic study N/A 11/04/2015    Procedure: CARDIOVERSION;  Surgeon: Iran OuchMuhammad A Arida, MD;  Location: [facility] ORS;  Service: Cardiovascular;  Laterality: N/A;     Current Outpatient Prescriptions  Medication Sig Dispense Refill  . acetaminophen (TYLENOL) 325 MG tablet Take 650 mg by mouth every 6 (six) hours as needed.    Marland Kitchen. apixaban (ELIQUIS) 5 MG TABS tablet Take 1 tablet (5 mg total) by mouth 2 (two) times daily. 60 tablet 3  . metoprolol (LOPRESSOR) 50 MG tablet  Take 1 tablet (50 mg total) by mouth 2 (two) times daily. 60 tablet 3  . ranitidine (ZANTAC) 150 MG tablet Take 150 mg by mouth daily as needed for heartburn.     No current facility-administered medications for this visit.    Allergies:   Nitroglycerin    Social History:  The patient  reports that he has never smoked. He has never used smokeless tobacco. He reports that he does not drink alcohol or use illicit drugs.   Family History:  The patient's family history includes Heart attack in his father; Heart attack (age of onset: 7355) in his maternal aunt.    ROS:  Please see the history of present illness.   Otherwise, review of systems are positive for none.   All other systems are reviewed and negative.    PHYSICAL EXAM: VS:  BP 110/78 mmHg  Pulse 79  Ht 5\' 10"  (1.778 m)  Wt 209 lb 8 oz (95.029 kg)  BMI 30.06 kg/m2 , BMI Body mass index is 30.06 kg/(m^2). GEN: Well nourished, well developed, in no acute distress HEENT: normal Neck: no JVD, carotid bruits, or masses Cardiac: Irregularly irregular; no murmurs, rubs, or gallops,no edema  Respiratory:  clear to auscultation bilaterally, normal work of breathing GI: soft, nontender, nondistended, + BS MS: no deformity or atrophy Skin: warm and dry, no rash Neuro:  Strength and sensation are intact Psych: euthymic mood, full affect   EKG:  EKG is ordered today. The ekg  ordered today demonstrates atrial fibrillation with ventricular rate of 86 bpm.   Recent Labs: 09/29/2015: B Natriuretic Peptide 45.0 09/30/2015: TSH 2.291 10/26/2015: ALT 28; BUN 19; Creatinine, Ser 1.01; Hemoglobin 14.6; Platelets 179; Potassium 4.5; Sodium 142    Lipid Panel    Component Value Date/Time   CHOL 152 09/30/2015 0256   TRIG 62 09/30/2015 0256   HDL 51 09/30/2015 0256   CHOLHDL 3.0 09/30/2015 0256   VLDL 12 09/30/2015 0256   LDLCALC 89 09/30/2015 0256      Wt Readings from Last 3 Encounters:  11/12/15 209 lb 8 oz (95.029 kg)  10/28/15 207  lb 12 oz (94.235 kg)  10/26/15 201 lb (91.173 kg)       ASSESSMENT AND PLAN:  1.  Persistent atrial fibrillation:  He underwent recent successful cardioversion but he is noted today to be back in atrial fibrillation. He does not seem to be having significant symptoms related to this and he didn't notice much difference after his successful cardioversion. Thus, I think it's reasonable to continue with rate control. I switched Eliquis to Pradaxa so that he can obtain this medication through medication management. Continue same dose metoprolol.  2. 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 He has no evidence of volume overload. I added small dose lisinopril 5 mg once daily. The plan is to repeat echocardiogram in 3 months to ensure improvement.   Disposition:   FU with me in 3 months  Signed,  Lorine Bears, MD  11/12/2015 8:50 AM    [facility]

## 2015-11-12 NOTE — Patient Instructions (Addendum)
Medication Instructions:  Your physician has recommended you make the following change in your medication:  1. Start lisinopril 5 mg Once daily 2. Prescription with refills provided for Lisionpril, metoprolol, and Eliquis.   Labwork: None ordered  Testing/Procedures: Your physician has requested that you have an echocardiogram. Echocardiography is a painless test that uses sound waves to create images of your heart. It provides your doctor with information about the size and shape of your heart and how well your heart's chambers and valves are working. This procedure takes approximately one hour. There are no restrictions for this procedure.  Date & Time: _________________________________________________________________  Follow-Up: Your physician recommends that you schedule a follow-up appointment in: 3 months with Dr. Kirke CorinArida.  Date & Time: ___________________________________________________________________ Patient may follow up on same day he comes for echocardiogram.    Any Other Special Instructions Will Be Listed Below (If Applicable).     If you need a refill on your cardiac medications before your next appointment, please call your pharmacy.  Echocardiogram An echocardiogram, or echocardiography, uses sound waves (ultrasound) to produce an image of your heart. The echocardiogram is simple, painless, obtained within a short period of time, and offers valuable information to your health care provider. The images from an echocardiogram can provide information such as:  Evidence of coronary artery disease (CAD).  Heart size.  Heart muscle function.  Heart valve function.  Aneurysm detection.  Evidence of a past heart attack.  Fluid buildup around the heart.  Heart muscle thickening.  Assess heart valve function. LET [facility] KNOW ABOUT:  Any allergies you have.  All medicines you are taking, including vitamins, herbs, eye drops, creams, and  over-the-counter medicines.  Previous problems you or members of your family have had with the use of anesthetics.  Any blood disorders you have.  Previous surgeries you have had.  Medical conditions you have.  Possibility of pregnancy, if this applies. BEFORE THE PROCEDURE  No special preparation is needed. Eat and drink normally.  PROCEDURE   In order to produce an image of your heart, gel will be applied to your chest and a wand-like tool (transducer) will be moved over your chest. The gel will help transmit the sound waves from the transducer. The sound waves will harmlessly bounce off your heart to allow the heart images to be captured in real-time motion. These images will then be recorded.  You may need an IV to receive a medicine that improves the quality of the pictures. AFTER THE PROCEDURE You may return to your normal schedule including diet, activities, and medicines, unless your health care provider tells you otherwise.   This information is not intended to replace advice given to you by your health care provider. Make sure you discuss any questions you have with your health care provider.   Document Released: 06/09/2000 Document Revised: 07/03/2014 Document Reviewed: 02/17/2013 Elsevier Interactive Patient Education [employer]2016 Elsevier Inc.

## 2015-11-18 ENCOUNTER — Encounter: Payer: Self-pay | Admitting: Cardiovascular Disease

## 2016-02-15 ENCOUNTER — Ambulatory Visit: Payer: Self-pay | Admitting: Cardiovascular Disease

## 2016-02-15 ENCOUNTER — Other Ambulatory Visit: Payer: Self-pay

## 2016-02-29 ENCOUNTER — Ambulatory Visit: Payer: Self-pay

## 2016-03-28 ENCOUNTER — Other Ambulatory Visit: Payer: Self-pay

## 2016-03-28 ENCOUNTER — Ambulatory Visit: Payer: Self-pay | Admitting: Cardiovascular Disease

## 2016-07-27 ENCOUNTER — Telehealth: Payer: Self-pay | Admitting: Cardiovascular Disease

## 2016-07-27 NOTE — Telephone Encounter (Signed)
Paper Rx placed in Nurse box.

## 2016-07-28 ENCOUNTER — Telehealth: Payer: Self-pay | Admitting: Cardiovascular Disease

## 2016-07-28 NOTE — Telephone Encounter (Signed)
Notified Rhetta Murannette Johnson @ [facility] pt's Eliquis d/c'd in May and Pradaxa prescribed.

## 2016-08-02 ENCOUNTER — Encounter: Payer: Self-pay | Admitting: Emergency Medicine

## 2016-08-02 ENCOUNTER — Emergency Department
Admission: EM | Admit: 2016-08-02 | Discharge: 2016-08-02 | Disposition: A | Discharge status: Home / Self Care | Payer: Self-pay | Attending: Emergency Medicine | Admitting: Emergency Medicine

## 2016-08-02 DIAGNOSIS — Z79899 Other long term (current) drug therapy: Secondary | ICD-10-CM | POA: Insufficient documentation

## 2016-08-02 DIAGNOSIS — B9789 Other viral agents as the cause of diseases classified elsewhere: Secondary | ICD-10-CM

## 2016-08-02 DIAGNOSIS — I10 Essential (primary) hypertension: Secondary | ICD-10-CM | POA: Insufficient documentation

## 2016-08-02 DIAGNOSIS — J069 Acute upper respiratory infection, unspecified: Secondary | ICD-10-CM | POA: Insufficient documentation

## 2016-08-02 MED ORDER — ONDANSETRON HCL 8 MG PO TABS: 8.0000 mg | ORAL_TABLET | Freq: Three times a day (TID) | ORAL | 0 refills | Status: DC | PRN

## 2016-08-02 MED ORDER — LIDOCAINE VISCOUS 2 % MT SOLN
OROMUCOSAL | Status: AC
  Administered 2016-08-02: 15 mL via OROMUCOSAL
  Filled 2016-08-02: qty 15

## 2016-08-02 MED ORDER — BENZONATATE 100 MG PO CAPS
200.0000 mg | ORAL_CAPSULE | Freq: Once | ORAL | Status: AC
  Administered 2016-08-02: 200 mg via ORAL
  Filled 2016-08-02: qty 2

## 2016-08-02 MED ORDER — LIDOCAINE VISCOUS 2 % MT SOLN
15.0000 mL | Freq: Once | OROMUCOSAL | Status: AC
  Administered 2016-08-02: 15 mL via OROMUCOSAL

## 2016-08-02 MED ORDER — PSEUDOEPH-BROMPHEN-DM 30-2-10 MG/5ML PO SYRP: 5.0000 mL | ORAL_SOLUTION | Freq: Four times a day (QID) | ORAL | 0 refills | Status: DC | PRN

## 2016-08-02 MED ORDER — TRAMADOL HCL 50 MG PO TABS: 50.0000 mg | ORAL_TABLET | Freq: Four times a day (QID) | ORAL | 0 refills | Status: DC | PRN

## 2016-08-02 MED ORDER — FIRST-DUKES MOUTHWASH MT SUSP: 10.0000 mL | Freq: Four times a day (QID) | OROMUCOSAL | 0 refills | Status: DC

## 2016-08-02 MED ORDER — ONDANSETRON 4 MG PO TBDP
4.0000 mg | ORAL_TABLET | Freq: Once | ORAL | Status: AC
  Administered 2016-08-02: 4 mg via ORAL
  Filled 2016-08-02: qty 1

## 2016-08-02 MED ORDER — DIPHENHYDRAMINE HCL 12.5 MG/5ML PO ELIX
25.0000 mg | ORAL_SOLUTION | Freq: Once | ORAL | Status: AC
  Administered 2016-08-02: 25 mg via ORAL
  Filled 2016-08-02: qty 10

## 2016-08-02 NOTE — ED Triage Notes (Signed)
Pt presents to ED with c/o cough and sore throat at this time since Friday. Pt c/o posttusive vomiting, reports yellow phlegm at this time.

## 2016-08-02 NOTE — ED Provider Notes (Signed)
[facility] Emergency Department Provider Note    ____________________________________________   First MD Initiated Contact with Patient 08/02/16 1143     (approximate)  I have reviewed the triage vital signs and the nursing notes.   HISTORY  Chief Complaint Cough and Sore Throat    HPI Rickey Lane is a 51 y.o. male patient complaining of cough, sore throat, and vomiting secondary to coughing. Patient states his cough is productive producing a yellow sputum. Patient denies diarrhea. Patient states does not take a flu shot this season. Except for Tylenol no palliative measures taken for this complaint.Patient rates his pain discomfort as 8/10. Patient describes pain as "achy".   Past Medical History:  Diagnosis Date  . Atrial fibrillation (HCC)    a. initially diagnosed in 09/2015. Reported a history of "irregular HR" for 3+ years  . Dysrhythmia   . GERD (gastroesophageal reflux disease)   . Headache   . Hypertension   . Kidney stone     Patient Active Problem List   Diagnosis Date Noted  . Persistent atrial fibrillation (HCC)   . Atrial fibrillation with RVR (HCC)   . New onset atrial fibrillation (HCC) 09/29/2015    Past Surgical History:  Procedure Laterality Date  . ELECTROPHYSIOLOGIC STUDY N/A 11/04/2015   Procedure: CARDIOVERSION;  Surgeon: Iran Ouch, MD;  Location: [facility] ORS;  Service: Cardiovascular;  Laterality: N/A;    Prior to Admission medications   Medication Sig Start Date End Date Taking? Authorizing Provider  acetaminophen (TYLENOL) 325 MG tablet Take 650 mg by mouth every 6 (six) hours as needed.    Historical Provider, MD  brompheniramine-pseudoephedrine-DM 30-2-10 MG/5ML syrup Take 5 mLs by mouth 4 (four) times daily as needed. 08/02/16   Joni Reining, PA-C  dabigatran (PRADAXA) 150 MG CAPS capsule Take 1 capsule (150 mg total) by mouth 2 (two) times daily. 11/12/15   Iran Ouch, MD    Diphenhyd-Hydrocort-Nystatin (FIRST-DUKES MOUTHWASH) SUSP Use as directed 10 mLs in the mouth or throat 4 (four) times daily. 08/02/16   Joni Reining, PA-C  lisinopril (PRINIVIL,ZESTRIL) 5 MG tablet Take 1 tablet (5 mg total) by mouth daily. 11/12/15   Iran Ouch, MD  metoprolol (LOPRESSOR) 50 MG tablet Take 1 tablet (50 mg total) by mouth 2 (two) times daily. 11/12/15   Iran Ouch, MD  ondansetron (ZOFRAN) 8 MG tablet Take 1 tablet (8 mg total) by mouth every 8 (eight) hours as needed for nausea or vomiting. 08/02/16   Joni Reining, PA-C  ranitidine (ZANTAC) 150 MG tablet Take 150 mg by mouth daily as needed for heartburn.    Historical Provider, MD  traMADol (ULTRAM) 50 MG tablet Take 1 tablet (50 mg total) by mouth every 6 (six) hours as needed for moderate pain. 08/02/16   Joni Reining, PA-C    Allergies Nitroglycerin  Family History  Problem Relation Age of Onset  . Heart attack Father     Initial MI age 10, deceased in his 8's from a massive MI  . Heart attack Maternal Aunt 11    Social History Social History  Substance Use Topics  . Smoking status: Never Smoker  . Smokeless tobacco: Never Used  . Alcohol use No    Review of Systems Constitutional: No fever/chills Eyes: No visual changes. ENT: No sore throat. Cardiovascular: Denies chest pain. History of A. fib Respiratory: Denies shortness of breath. Gastrointestinal: No abdominal pain.  No nausea, no vomiting.  No diarrhea.  No constipation. Genitourinary: Negative for dysuria. Musculoskeletal: Negative for back pain. Skin: Negative for rash. Neurological: Negative for headaches, focal weakness or numbness. Endocrine:Hypertension Hematological/Lymphatic: Allergic/Immunilogical: Nitroglycerin _________________________________________   PHYSICAL EXAM:  VITAL SIGNS: ED Triage Vitals  Enc Vitals Group     BP 08/02/16 1047 133/82     Pulse Rate 08/02/16 1047 (!) 53     Resp 08/02/16 1047 18     Temp  08/02/16 1047 98.7 F (37.1 C)     Temp Source 08/02/16 1047 Oral     SpO2 08/02/16 1047 97 %     Weight 08/02/16 1044 225 lb (102.1 kg)     Height 08/02/16 1044 5\' 10"  (1.778 m)     Head Circumference --      Peak Flow --      Pain Score 08/02/16 1045 8     Pain Loc --      Pain Edu? --      Excl. in GC? --     Constitutional: Alert and oriented. Well appearing and in no acute distress. Eyes: Conjunctivae are normal. PERRL. EOMI. Head: Atraumatic. Nose: Edematous nasal turbinates clear rhinorrhea  Mouth/Throat: Mucous membranes are moist.  Oropharynx erythematous. Postnasal drainage Neck: No stridor. No cervical spine tenderness to palpation. Hematological/Lymphatic/Immunilogical: No cervical lymphadenopathy. Cardiovascular: The cardiac. Grossly normal heart sounds.  Good peripheral circulation. Respiratory: Normal respiratory effort.  No retractions. Lungs CTAB. Gastrointestinal: Soft and nontender. No distention. No abdominal bruits. No CVA tenderness. Musculoskeletal: No lower extremity tenderness nor edema.  No joint effusions. Neurologic:  Normal speech and language. No gross focal neurologic deficits are appreciated. No gait instability. Skin:  Skin is warm, dry and intact. No rash noted. Psychiatric: Mood and affect are normal. Speech and behavior are normal.  ____________________________________________   LABS (all labs ordered are listed, but only abnormal results are displayed)  Labs Reviewed - No data to display ____________________________________________  EKG   ____________________________________________  RADIOLOGY   ____________________________________________   PROCEDURES  Procedure(s) performed: None  Procedures  Critical Care performed: No  ____________________________________________   INITIAL IMPRESSION / ASSESSMENT AND PLAN / ED COURSE  Pertinent labs & imaging results that were available during my care of the patient were reviewed by  me and considered in my medical decision making (see chart for details).  Viral illness. Patient given discharge care instructions. Patient given a prescription for Bromfed-DM, 2 mouthwash, Zofran, and tramadol. Patient given a work note. Patient [facility].      ____________________________________________   FINAL CLINICAL IMPRESSION(S) / ED DIAGNOSES  Final diagnoses:  Viral URI with cough      NEW MEDICATIONS STARTED DURING THIS VISIT:  New Prescriptions   BROMPHENIRAMINE-PSEUDOEPHEDRINE-DM 30-2-10 MG/5ML SYRUP    Take 5 mLs by mouth 4 (four) times daily as needed.   DIPHENHYD-HYDROCORT-NYSTATIN (FIRST-DUKES MOUTHWASH) SUSP    Use as directed 10 mLs in the mouth or throat 4 (four) times daily.   ONDANSETRON (ZOFRAN) 8 MG TABLET    Take 1 tablet (8 mg total) by mouth every 8 (eight) hours as needed for nausea or vomiting.   TRAMADOL (ULTRAM) 50 MG TABLET    Take 1 tablet (50 mg total) by mouth every 6 (six) hours as needed for moderate pain.     Note:  This document was prepared using Dragon voice recognition software and may include unintentional dictation errors.    Joni Reining, PA-C 08/02/16 1205  Arnaldo NatalPaul F Malinda, MD 08/02/16 [phone]

## 2016-08-02 NOTE — ED Notes (Signed)
NAD noted at time of D/C. Pt denies questions or concerns. Pt ambulatory to the lobby at this time.  

## 2016-08-02 NOTE — ED Notes (Signed)
See triage note  States he developed cough with sore throat on friday  Cough is occasionally prod  Afebrile on arrival

## 2016-10-24 ENCOUNTER — Telehealth: Payer: Self-pay | Admitting: Cardiovascular Disease

## 2016-10-24 ENCOUNTER — Other Ambulatory Visit: Payer: Self-pay

## 2016-10-24 MED ORDER — METOPROLOL TARTRATE 50 MG PO TABS: 50.0000 mg | ORAL_TABLET | Freq: Two times a day (BID) | ORAL | 0 refills | Status: DC

## 2016-10-24 MED ORDER — LISINOPRIL 5 MG PO TABS: 5.0000 mg | ORAL_TABLET | Freq: Every day | ORAL | 0 refills | Status: DC

## 2016-10-24 MED ORDER — DABIGATRAN ETEXILATE MESYLATE 150 MG PO CAPS: 150.0000 mg | ORAL_CAPSULE | Freq: Two times a day (BID) | ORAL | 0 refills | Status: DC

## 2016-10-24 NOTE — Telephone Encounter (Addendum)
Pt returned call. He stopped taking all medications for several months stating he didn't feel they were helping. Reviewed reasons and importance of lisinopril, metoprolol and pradaxa and instructed him to resume medications. Pt has chronic afib.  He has started a new job at [employer]; includes heavy lifting. Pt feels winded and SOB when exerting himself and  would like a letter from Dr. Kirke Corin stating he is not allowed to lift heavy items.  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  Informed pt symptoms could be related to HTN or the afib as this is not currently being controlled.  He does not take BP or HR and is unsure if either are elevated. Advised to purchase BP cuff or monitor VS at local pharmacy. He is agreeable to resume medications and f/u w/Dr. Kirke Corin June 1 at which time he can discuss letter if he is still symptomatic.  Pt will need to wait until his insurance becomes effective before scheduling the echo that was ordered last year.  [facility] one [facility]ill to [facility]. Pt understands if [facility] can not fill lisinopril and/or metoprolol, we will send these to [facility] as they are on the $4 list.  Pt agreeable w/plan.

## 2016-10-24 NOTE — Telephone Encounter (Signed)
Received notification from [employer] patient assistance program informing of May enrollment expiration date. Noted final shipment of Eliquis has been sent. Eliquis was switched to Pradaxa May 2017. I have left a detailed message on pt's cell VM to contact the office regarding medications. Pt past due for f/u visit.

## 2016-10-24 NOTE — Telephone Encounter (Signed)
S/w Rita at [facility]. Pt last had prescriptions filled there June 2017. Since pt will have insurance soon, they will not proceed w/requalifying for assistance.  She will fill lisinopril, metoprolol and pradaxa for the patient this month. Notified pt who states he will pick up the medications today.

## 2016-11-01 ENCOUNTER — Telehealth: Payer: Self-pay | Admitting: Cardiovascular Disease

## 2016-11-01 NOTE — Telephone Encounter (Signed)
Pt would like to discuss his Eliquis. He is states he is not taking this, but wants to make sure this is correct.

## 2016-11-01 NOTE — Telephone Encounter (Signed)
S/w pt who inquires if he should be taking eliquis. He was able to obtain the one month refill for lisinopril, metoprolol and pradaxa at [facility] before his June 1 appt w/Dr. Kirke CorinArida and noticed eliquis refill was not given.  Informed pt that eliquis was d/c'd when pradaxa was started.  He verbalized understanding and confirmed OV. He was unable to have echo as ordered at the last OV due to lack of insurance. As he now has insurance, he understands to bring the card to his appt and will schedule echo at that time. Pt had no further questions.

## 2016-11-24 ENCOUNTER — Ambulatory Visit: Payer: Self-pay | Admitting: Cardiovascular Disease

## 2017-03-15 ENCOUNTER — Emergency Department: Payer: BLUE CROSS/BLUE SHIELD

## 2017-03-15 ENCOUNTER — Emergency Department
Admission: EM | Admit: 2017-03-15 | Discharge: 2017-03-15 | Disposition: A | Discharge status: Home / Self Care | Payer: BLUE CROSS/BLUE SHIELD | Attending: Student in an Organized Health Care Education/Training Program | Admitting: Student in an Organized Health Care Education/Training Program

## 2017-03-15 ENCOUNTER — Encounter: Payer: Self-pay | Admitting: Radiology

## 2017-03-15 DIAGNOSIS — R1011 Right upper quadrant pain: Secondary | ICD-10-CM | POA: Insufficient documentation

## 2017-03-15 DIAGNOSIS — Z79899 Other long term (current) drug therapy: Secondary | ICD-10-CM | POA: Insufficient documentation

## 2017-03-15 DIAGNOSIS — K529 Noninfective gastroenteritis and colitis, unspecified: Secondary | ICD-10-CM | POA: Diagnosis not present

## 2017-03-15 DIAGNOSIS — I10 Essential (primary) hypertension: Secondary | ICD-10-CM | POA: Diagnosis not present

## 2017-03-15 DIAGNOSIS — I4891 Unspecified atrial fibrillation: Secondary | ICD-10-CM

## 2017-03-15 LAB — COMPREHENSIVE METABOLIC PANEL
ALK PHOS: 51 U/L (ref 38–126)
ALT: 26 U/L (ref 17–63)
AST: 33 U/L (ref 15–41)
Albumin: 2.8 g/dL — ABNORMAL LOW (ref 3.5–5.0)
Anion gap: 6 (ref 5–15)
BILIRUBIN TOTAL: 1 mg/dL (ref 0.3–1.2)
BUN: 14 mg/dL (ref 6–20)
CALCIUM: 6.7 mg/dL — AB (ref 8.9–10.3)
CHLORIDE: 116 mmol/L — AB (ref 101–111)
CO2: 21 mmol/L — ABNORMAL LOW (ref 22–32)
CREATININE: 0.64 mg/dL (ref 0.61–1.24)
Glucose, Bld: 123 mg/dL — ABNORMAL HIGH (ref 65–99)
Potassium: 2.6 mmol/L — CL (ref 3.5–5.1)
Sodium: 143 mmol/L (ref 135–145)
TOTAL PROTEIN: 4.9 g/dL — AB (ref 6.5–8.1)

## 2017-03-15 LAB — CBC WITH DIFFERENTIAL/PLATELET
BASOS ABS: 0 10*3/uL (ref 0–0.1)
Basophils Relative: 1 %
EOS PCT: 1 %
Eosinophils Absolute: 0.1 10*3/uL (ref 0–0.7)
HCT: 44.9 % (ref 40.0–52.0)
Hemoglobin: 15.9 g/dL (ref 13.0–18.0)
LYMPHS PCT: 29 %
Lymphs Abs: 2.8 10*3/uL (ref 1.0–3.6)
MCH: 30.9 pg (ref 26.0–34.0)
MCHC: 35.4 g/dL (ref 32.0–36.0)
MCV: 87.2 fL (ref 80.0–100.0)
MONO ABS: 0.7 10*3/uL (ref 0.2–1.0)
MONOS PCT: 7 %
Neutro Abs: 6 10*3/uL (ref 1.4–6.5)
Neutrophils Relative %: 62 %
PLATELETS: 190 10*3/uL (ref 150–440)
RBC: 5.15 MIL/uL (ref 4.40–5.90)
RDW: 13 % (ref 11.5–14.5)
WBC: 9.7 10*3/uL (ref 3.8–10.6)

## 2017-03-15 LAB — PROTIME-INR
INR: 1
Prothrombin Time: 13.1 seconds (ref 11.4–15.2)

## 2017-03-15 LAB — LACTIC ACID, PLASMA: LACTIC ACID, VENOUS: 1.6 mmol/L (ref 0.5–1.9)

## 2017-03-15 LAB — TROPONIN I

## 2017-03-15 LAB — LIPASE, BLOOD: LIPASE: 34 U/L (ref 11–51)

## 2017-03-15 MED ORDER — POTASSIUM CHLORIDE 10 MEQ/100ML IV SOLN
10.0000 meq | INTRAVENOUS | Status: DC
  Administered 2017-03-15: 10 meq via INTRAVENOUS
  Filled 2017-03-15 (×4): qty 100

## 2017-03-15 MED ORDER — POTASSIUM CHLORIDE CRYS ER 20 MEQ PO TBCR
40.0000 meq | EXTENDED_RELEASE_TABLET | Freq: Once | ORAL | Status: AC
  Administered 2017-03-15: 40 meq via ORAL
  Filled 2017-03-15: qty 2

## 2017-03-15 MED ORDER — FENTANYL CITRATE (PF) 100 MCG/2ML IJ SOLN
50.0000 ug | INTRAMUSCULAR | Status: DC | PRN
  Administered 2017-03-15: 50 ug via INTRAVENOUS
  Filled 2017-03-15: qty 2

## 2017-03-15 MED ORDER — METOPROLOL TARTRATE 5 MG/5ML IV SOLN
5.0000 mg | Freq: Once | INTRAVENOUS | Status: AC
  Administered 2017-03-15: 5 mg via INTRAVENOUS
  Filled 2017-03-15: qty 5

## 2017-03-15 MED ORDER — IOPAMIDOL (ISOVUE-370) INJECTION 76%
125.0000 mL | Freq: Once | INTRAVENOUS | Status: AC | PRN
  Administered 2017-03-15: 125 mL via INTRAVENOUS

## 2017-03-15 MED ORDER — METOPROLOL TARTRATE 50 MG PO TABS: 50.0000 mg | ORAL_TABLET | Freq: Two times a day (BID) | ORAL | 1 refills | Status: DC

## 2017-03-15 MED ORDER — PROMETHAZINE HCL 12.5 MG PO TABS: 12.5000 mg | ORAL_TABLET | Freq: Four times a day (QID) | ORAL | 0 refills | Status: DC | PRN

## 2017-03-15 MED ORDER — SODIUM CHLORIDE 0.9 % IV BOLUS (SEPSIS)
1000.0000 mL | Freq: Once | INTRAVENOUS | Status: AC
  Administered 2017-03-15: 1000 mL via INTRAVENOUS

## 2017-03-15 NOTE — Discharge Instructions (Signed)
Please restart your atrial fibrillation medications as well as your blood thinner medicine. [facility] call [facility] to establish a primary care physician. They can also recheck your potassium level.  Please take a clear liquid diet for the next 24 hours, then advance to a bland diet as tolerated.  Return to the emergency department if you develop chest pain, palpitations, lightheadedness or fainting, fever, inability to keep down fluids, worsening pain, or any other symptoms concerning to you.

## 2017-03-15 NOTE — ED Notes (Signed)
Pt taken to room 15 by EDT, Scott to be placed on card monitor for further evaluation; report given to care nurse, Rosey Bath, RN

## 2017-03-15 NOTE — ED Provider Notes (Signed)
[facility] Emergency Department Provider Note    First MD Initiated Contact with Patient 03/15/17 1936     (approximate)  I have reviewed the triage vital signs and the nursing notes.   HISTORY  Chief Complaint Abdominal Pain    HPI Rickey Lane is a 51 y.o. male presents with right upper quadrant and right flank pain that started today. States she's been having the pain on and off for the past several weeks but it became severe today. Patient also has a history of A. fib and is supposed to be on rate control medication as well as prepacks but patient states that he stopped taking his medications one year ago because he didn't like the way they made him feel.  No fevers. No nausea or vomiting. No dysuria. No diarrhea.   Past Medical History:  Diagnosis Date  . Atrial fibrillation (HCC)    a. initially diagnosed in 09/2015. Reported a history of "irregular HR" for 3+ years  . Dysrhythmia   . GERD (gastroesophageal reflux disease)   . Headache   . Hypertension   . Kidney stone    Family History  Problem Relation Age of Onset  . Heart attack Father        Initial MI age 83, deceased in his 47's from a massive MI  . Heart attack Maternal Aunt 55   Past Surgical History:  Procedure Laterality Date  . ELECTROPHYSIOLOGIC STUDY N/A 11/04/2015   Procedure: CARDIOVERSION;  Surgeon: Iran Ouch, MD;  Location: [facility] ORS;  Service: Cardiovascular;  Laterality: N/A;   Patient Active Problem List   Diagnosis Date Noted  . Persistent atrial fibrillation (HCC)   . Atrial fibrillation with RVR (HCC)   . New onset atrial fibrillation (HCC) 09/29/2015      Prior to Admission medications   Medication Sig Start Date End Date Taking? Authorizing Provider  acetaminophen (TYLENOL) 325 MG tablet Take 650 mg by mouth every 6 (six) hours as needed.    [provider]  brompheniramine-pseudoephedrine-DM 30-2-10 MG/5ML syrup Take 5 mLs by mouth 4  (four) times daily as needed. 08/02/16   Joni Reining, PA-C  dabigatran (PRADAXA) 150 MG CAPS capsule Take 1 capsule (150 mg total) by mouth 2 (two) times daily. 10/24/16   Iran Ouch, MD  Diphenhyd-Hydrocort-Nystatin (FIRST-DUKES MOUTHWASH) SUSP Use as directed 10 mLs in the mouth or throat 4 (four) times daily. 08/02/16   Joni Reining, PA-C  lisinopril (PRINIVIL,ZESTRIL) 5 MG tablet Take 1 tablet (5 mg total) by mouth daily. 10/24/16   Iran Ouch, MD  metoprolol tartrate (LOPRESSOR) 50 MG tablet Take 1 tablet (50 mg total) by mouth 2 (two) times daily. 03/15/17   Willy Eddy, MD  ondansetron (ZOFRAN) 8 MG tablet Take 1 tablet (8 mg total) by mouth every 8 (eight) hours as needed for nausea or vomiting. 08/02/16   Joni Reining, PA-C  promethazine (PHENERGAN) 12.5 MG tablet Take 1 tablet (12.5 mg total) by mouth every 6 (six) hours as needed for nausea or vomiting. 03/15/17   Willy Eddy, MD  ranitidine (ZANTAC) 150 MG tablet Take 150 mg by mouth daily as needed for heartburn.    [provider]  traMADol (ULTRAM) 50 MG tablet Take 1 tablet (50 mg total) by mouth every 6 (six) hours as needed for moderate pain. 08/02/16   Joni Reining, PA-C    Allergies Nitroglycerin    Social History Social History  Substance Use  Topics  . Smoking status: Never Smoker  . Smokeless tobacco: Never Used  . Alcohol use No    Review of Systems Patient denies headaches, rhinorrhea, blurry vision, numbness, shortness of breath, chest pain, edema, cough, abdominal pain, nausea, vomiting, diarrhea, dysuria, fevers, rashes or hallucinations unless otherwise stated above in HPI. ____________________________________________   PHYSICAL EXAM:  VITAL SIGNS: Vitals:   03/15/17 1920  BP: 133/87  Pulse: (!) 131  Resp: 18  Temp: 98.2 F (36.8 C)  SpO2: 95%    Constitutional: Alert and oriented. Well appearing and in no acute distress. Eyes: Conjunctivae are normal.  Head:  Atraumatic. Nose: No congestion/rhinnorhea. Mouth/Throat: Mucous membranes are moist.   Neck: No stridor. Painless ROM.  Cardiovascular: Normal rate, regular rhythm. Grossly normal heart sounds.  Good peripheral circulation. Respiratory: Normal respiratory effort.  No retractions. Lungs CTAB. Gastrointestinal: Soft with mild RUQ ttp. No distention. No abdominal bruits. No CVA tenderness. Genitourinary:  Musculoskeletal: No lower extremity tenderness nor edema.  No joint effusions. Neurologic:  Normal speech and language. No gross focal neurologic deficits are appreciated. No facial droop Skin:  Skin is warm, dry and intact. No rash noted. Psychiatric: Mood and affect are normal. Speech and behavior are normal.  ____________________________________________   LABS (all labs ordered are listed, but only abnormal results are displayed)  Results for orders placed or performed during the hospital encounter of 03/15/17 (from the past 24 hour(s))  CBC with Differential     Status: None   Collection Time: 03/15/17  7:29 PM  Result Value Ref Range   WBC 9.7 3.8 - 10.6 K/uL   RBC 5.15 4.40 - 5.90 MIL/uL   Hemoglobin 15.9 13.0 - 18.0 g/dL   HCT 16.1 09.6 - 04.5 %   MCV 87.2 80.0 - 100.0 fL   MCH 30.9 26.0 - 34.0 pg   MCHC 35.4 32.0 - 36.0 g/dL   RDW 40.9 81.1 - 91.4 %   Platelets 190 150 - 440 K/uL   Neutrophils Relative % 62 %   Neutro Abs 6.0 1.4 - 6.5 K/uL   Lymphocytes Relative 29 %   Lymphs Abs 2.8 1.0 - 3.6 K/uL   Monocytes Relative 7 %   Monocytes Absolute 0.7 0.2 - 1.0 K/uL   Eosinophils Relative 1 %   Eosinophils Absolute 0.1 0 - 0.7 K/uL   Basophils Relative 1 %   Basophils Absolute 0.0 0 - 0.1 K/uL  Protime-INR     Status: None   Collection Time: 03/15/17  7:38 PM  Result Value Ref Range   Prothrombin Time 13.1 11.4 - 15.2 seconds   INR 1.00   Lactic acid, plasma     Status: None   Collection Time: 03/15/17  7:38 PM  Result Value Ref Range   Lactic Acid, Venous 1.6 0.5  - 1.9 mmol/L   ____________________________________________  EKG My review and personal interpretation at Time: 19:20   Indication: abd pain  Rate: 130  Rhythm: afib w rvr Axis: normal Other: non specific st changes, no stemi ____________________________________________  RADIOLOGY  I personally reviewed all radiographic images ordered to evaluate for the above acute complaints and reviewed radiology reports and findings.  These findings were personally discussed with the patient.  Please see medical record for radiology report.  ____________________________________________   PROCEDURES  Procedure(s) performed:  Procedures    Critical Care performed: no ____________________________________________   INITIAL IMPRESSION / ASSESSMENT AND PLAN / ED COURSE  Pertinent labs & imaging results that were available during  my care of the patient were reviewed by me and considered in my medical decision making (see chart for details).  DDX: cholelithiasis, cholecystitis, MI, afib with rvr, colitis, appy, stone  Rickey Lane is a 51 y.o. who presents to the ED with times as described above. He is well-appearing and well perfused. Is noted to be in A. fib with RVR but the patient has been noncompliant with his medications for over one year. Blood work and CT imaging will be ordered for the above differential. I do believe, despite the patient's well appearance, that given his history of A. fib not on medications that he needs further evaluation of any mesenteric ischemia or intra-abdominal process. I am less concerned about his A. fib with RVR as he is normotensive and this is most well explained by his noncompliance. We'll give IV fluids for any component of dehydration given his abdominal pain. We'll give a single dose of IV Lopressor. I anticipate when CT imaging returns, if negative that we will be able to restart his home medications with appropriate management of his tachycardia.  Patient will be signed out to Dr. Sharma Covert pending results of imaging. I anticipate he would be appropriate for further workup as an outpatient assuming that the CT imaging shows no acute process.  Have discussed with the patient and available family all diagnostics and treatments performed thus far and all questions were answered to the best of my ability. The patient demonstrates understanding and agreement with plan.       ____________________________________________   FINAL CLINICAL IMPRESSION(S) / ED DIAGNOSES  Final diagnoses:  Right upper quadrant abdominal pain  Atrial fibrillation with rapid ventricular response (HCC)      NEW MEDICATIONS STARTED DURING THIS VISIT:  New Prescriptions   PROMETHAZINE (PHENERGAN) 12.5 MG TABLET    Take 1 tablet (12.5 mg total) by mouth every 6 (six) hours as needed for nausea or vomiting.     Note:  This document was prepared using Dragon voice recognition software and may include unintentional dictation errors.    Willy Eddy, MD 03/15/17 2024

## 2017-03-15 NOTE — ED Provider Notes (Signed)
The patient was signed out to me by Dr. Roxan Hockey. 51 year old who came in for abdominal pain, but also was found to have A. fib with RVR because he has not been taking any of his home medications. From the cardiovascular standpoint, the patient's heart rate is now 88 well-controlled. He has all of his medications at home and I have encouraged him to restart them and follow-up with his cardiologist as soon as possible. He did have a potassium of 2.6, and has had both oral and IV supplementation. His CT scan shows proximal jejunal wall thickening compatible with enteritis. Given that he has not been having any fevers, has mild pain and has not had any vomiting, antibiotics are not indicated at this time. I have talked to him about symptomatic treatment as well as clear liquid diet. The patient will make a follow-up appointment with his primary care physician.  I have reevaluated the patient myself and he is stable for discharge. Return precautions as well as follow-up instructions were discussed.   Rockne Menghini, MD 03/15/17 [phone]

## 2017-03-15 NOTE — ED Notes (Signed)
K+ reported to Dr Sharma Covert - new orders received

## 2017-03-15 NOTE — ED Triage Notes (Addendum)
.  Patient ambulatory to triage with steady gait, without difficulty or distress noted; pt reports intermittent left side pain radiating into flank and abd accomp by urinary frequency; denies hx of same; as of note, radial pulse fast & irreg; pt's shirt damp; pt reports that he does have a hx of afib and has noted that his heart has been beating fast and he has been sweating a lot; st has been off of his card meds for over a year

## 2017-03-15 NOTE — ED Notes (Signed)
Rate of K+ slowed to 21ml/hr due to pt c/o pain with rate of 154ml/hr

## 2017-03-15 NOTE — ED Notes (Signed)
Pt c/o right lower side pain for the last 2 weeks - he states nothing made it worse today but it was not getting better so he came to the ED - while pt was sitting in waiting room he was noted to be diaphoretic and heart rate 130's - he reports that he has a history of afib and at this time EKG is showing afib rate 132 - he states he has not had any medication in over a year because he did not feel like it was helping him so he stopped taking it and stopped going to the cardiologist - he reports that he can "feel is heart racing" - denies chest pain - denies shortness of breath

## 2017-03-15 NOTE — ED Notes (Signed)
Pt signed hard copy of ED discharge and placed on pt's chart 

## 2017-03-15 NOTE — ED Notes (Signed)
Patient transported to CT 

## 2017-03-15 NOTE — ED Notes (Signed)

## 2017-03-16 ENCOUNTER — Telehealth: Payer: Self-pay

## 2017-03-16 NOTE — Telephone Encounter (Signed)
Lmov for patient to call back °They were seen in on 03/15/17  °Patient has seen Dr Arida in past  °Will try again at a later time °

## 2017-03-21 NOTE — Telephone Encounter (Signed)
Lmov for patient to call back They were seen in on 03/15/17  Patient has seen Dr Kirke Corin in past  Will try again at a later time

## 2017-03-26 NOTE — Telephone Encounter (Signed)
Lmov for patient to call back They were seen in on 03/15/17  Patient has seen Dr Kirke Corin in past  Will try again at a later time

## 2017-04-10 NOTE — Progress Notes (Signed)
04/11/2017 4:43 PM   Rickey Lane 22-Jul-1965 [id number]  Referring provider: No referring provider defined for this encounter.  Chief Complaint  Patient presents with  . New Patient (Initial Visit)    kidney stone referred by ER    HPI: Patient is a 51 year old Caucasian who is referred by [facility] ED for nephrolithiasis.  Patient states the onset of the pain was several years ago.  It has gotten worse over the last two months.  It was sharp.   It lasted for several hours.  The pain was located in the right upper quadrant and did not radiate.    The pain was a 7-8/10.  Nothing made the pain better.   Nothing made the pain worse.  He did have or did not have gross hematuria, fevers, chills, nausea or vomiting.  A CT angio abd/pel w/wo was performed on 03/15/2017 noted several loops of proximal jejunum with mild wall thickening and mucosal enhancement compatible with acute enteritis. No associated obstruction, perforation, fluid collection or abscess. No free air.  Nonobstructing left nephrolithiasis.  He was diagnosed with enteritis and discharged to home.  He does have a prior history of stones.    BPH WITH LUTS  (prostate and/or bladder) His IPSS score today is 12, which is moderate lower urinary tract symptomatology.  He is with his quality life due to his urinary symptoms. His PVR is 37 mL.      His major complaints today are frequency, urgency, nocturia x 5 and incontinence (post void dribbling).  He has had these symptoms for last few months.  He denies any dysuria, mostly dissatisfied hematuria or suprapubic pain.   He also denies any recent fevers, chills, nausea or vomiting.  His maternal grandfather had prostate cancer.      IPSS    Row Name 04/11/17 1600         International Prostate Symptom Score   How often have you had the sensation of not emptying your bladder? Not at All     How often have you had to urinate less than every two hours? About  half the time     How often have you found you stopped and started again several times when you urinated? Not at All     How often have you found it difficult to postpone urination? Almost always     How often have you had a weak urinary stream? Not at All     How often have you had to strain to start urination? Not at All     How many times did you typically get up at night to urinate? 4 Times     Total IPSS Score 12       Quality of Life due to urinary symptoms   If you were to spend the rest of your life with your urinary condition just the way it is now how would you feel about that? Mostly Disatisfied        Score:  1-7 Mild 8-19 Moderate 20-35 Severe   PMH: Past Medical History:  Diagnosis Date  . Atrial fibrillation (HCC)    a. initially diagnosed in 09/2015. Reported a history of "irregular HR" for 3+ years  . Dysrhythmia   . GERD (gastroesophageal reflux disease)   . Headache   . Kidney stone     Surgical History: Past Surgical History:  Procedure Laterality Date  . ELECTROPHYSIOLOGIC STUDY N/A 11/04/2015   Procedure: CARDIOVERSION;  Surgeon: Jerolyn Center  Argentina Donovan, MD;  Location: [facility] ORS;  Service: Cardiovascular;  Laterality: N/A;  . lithotripsy      Home Medications:  Allergies as of 04/11/2017      Reactions   Nitroglycerin Other (See Comments)   "Shivering" and "decreased heart rate"      Medication List       Accurate as of 04/11/17  4:43 PM. Always use your most recent med list.          acetaminophen 325 MG tablet Commonly known as:  TYLENOL Take 650 mg by mouth every 6 (six) hours as needed.   aspirin 325 MG tablet Take 325 mg by mouth.   Biotin 5 MG Caps Take 5 mg by mouth.   brompheniramine-pseudoephedrine-DM 30-2-10 MG/5ML syrup Take 5 mLs by mouth 4 (four) times daily as needed.   Cyanocobalamin 1000 MCG/ML Liqd Take by mouth.   dabigatran 150 MG Caps capsule Commonly known as:  PRADAXA Take 1 capsule (150 mg total) by mouth 2  (two) times daily.   DOCOSAHEXAENOIC ACID PO Take 1 g by mouth.   FIRST-DUKES MOUTHWASH Susp Use as directed 10 mLs in the mouth or throat 4 (four) times daily.   lisinopril 5 MG tablet Commonly known as:  PRINIVIL,ZESTRIL Take 1 tablet (5 mg total) by mouth daily.   metoprolol tartrate 50 MG tablet Commonly known as:  LOPRESSOR Take 1 tablet (50 mg total) by mouth 2 (two) times daily.   MULTI-VITAMINS Tabs Take by mouth.   naphazoline 0.1 % ophthalmic solution Commonly known as:  NAPHCON Apply to eye.   ondansetron 8 MG tablet Commonly known as:  ZOFRAN Take 1 tablet (8 mg total) by mouth every 8 (eight) hours as needed for nausea or vomiting.   promethazine 12.5 MG tablet Commonly known as:  PHENERGAN Take 1 tablet (12.5 mg total) by mouth every 6 (six) hours as needed for nausea or vomiting.   ranitidine 150 MG tablet Commonly known as:  ZANTAC Take 150 mg by mouth daily as needed for heartburn.   tamsulosin 0.4 MG Caps capsule Commonly known as:  FLOMAX Take 1 capsule (0.4 mg total) by mouth daily.   terbinafine 250 MG tablet Commonly known as:  LAMISIL Take 250 mg by mouth.   traMADol 50 MG tablet Commonly known as:  ULTRAM Take 1 tablet (50 mg total) by mouth every 6 (six) hours as needed for moderate pain.       Allergies:  Allergies  Allergen Reactions  . Nitroglycerin Other (See Comments)    "Shivering" and "decreased heart rate"    Family History: Family History  Problem Relation Age of Onset  . Heart attack Father        Initial MI age 13, deceased in his 61's from a massive MI  . Heart attack Maternal Aunt 55  . Prostate cancer Maternal Grandfather   . Kidney cancer Neg Hx   . Bladder Cancer Neg Hx     Social History:  reports that he has never smoked. He has never used smokeless tobacco. He reports that he drinks alcohol. He reports that he does not use drugs.  ROS: UROLOGY Frequent Urination?: Yes Hard to postpone urination?:  Yes Burning/pain with urination?: No Get up at night to urinate?: Yes Leakage of urine?: Yes Urine stream starts and stops?: No Trouble starting stream?: No Do you have to strain to urinate?: No Blood in urine?: No Urinary tract infection?: No Sexually transmitted disease?: No Injury to kidneys or bladder?:  No Painful intercourse?: No Weak stream?: No Erection problems?: Yes Penile pain?: No  Gastrointestinal Nausea?: No Vomiting?: No Indigestion/heartburn?: Yes Diarrhea?: No Constipation?: No  Constitutional Fever: No Night sweats?: Yes Weight loss?: No Fatigue?: Yes  Skin Skin rash/lesions?: No Itching?: No  Eyes Blurred vision?: No Double vision?: No  Ears/Nose/Throat Sore throat?: No Sinus problems?: No  Hematologic/Lymphatic Swollen glands?: No Easy bruising?: No  Cardiovascular Leg swelling?: No Chest pain?: No  Respiratory Cough?: No Shortness of breath?: No  Endocrine Excessive thirst?: No  Musculoskeletal Back pain?: No Joint pain?: No  Neurological Headaches?: Yes Dizziness?: No  Psychologic Depression?: No Anxiety?: No  Physical Exam: BP (!) 156/113   Pulse (!) 103   Ht  (1.778 m)   Wt 207 lb 3.2 oz (94 kg)   BMI 29.73 kg/m   Constitutional: Well nourished. Alert and oriented, No acute distress. HEENT: [place] AT, moist mucus membranes. Trachea midline, no masses. Cardiovascular: No clubbing, cyanosis, or edema. Respiratory: Normal respiratory effort, no increased work of breathing. GI: Abdomen is soft, non tender, non distended, no abdominal masses. Liver and spleen not palpable.  No hernias appreciated.  Stool sample for occult testing is not indicated.   GU: No CVA tenderness.  No bladder fullness or masses.  Patient with circumcised phallus.  Urethral meatus is patent.  No penile discharge. No penile lesions or rashes. Scrotum without lesions, cysts, rashes and/or edema.  Testicles are located scrotally bilaterally. No  masses are appreciated in the testicles. Left and right epididymis are normal. Rectal: Patient with  normal sphincter tone. Anus and perineum without scarring or rashes. No rectal masses are appreciated. Prostate is approximately 50 grams, no nodules are appreciated. Seminal vesicles are normal. Skin: No rashes, bruises or suspicious lesions. Lymph: No cervical or inguinal adenopathy. Neurologic: Grossly intact, no focal deficits, moving all 4 extremities. Psychiatric: Normal mood and affect.  Laboratory Data: Lab Results  Component Value Date   WBC 9.7 03/15/2017   HGB 15.9 03/15/2017   HCT 44.9 03/15/2017   MCV 87.2 03/15/2017   PLT 190 03/15/2017    Lab Results  Component Value Date   CREATININE 0.64 03/15/2017    Lab Results  Component Value Date   PSA 0.61 09/30/2015    Lab Results  Component Value Date   AST 33 03/15/2017   Lab Results  Component Value Date   ALT 26 03/15/2017   I have reviewed the labs.   Pertinent Imaging: CLINICAL DATA:  Right upper quadrant pain, right flank pain, suspect gastroenteritis or colitis  EXAM: CT ANGIOGRAPHY ABDOMEN AND PELVIS WITH CONTRAST AND WITHOUT CONTRAST  TECHNIQUE: Multidetector CT imaging of the abdomen and pelvis was performed using the standard protocol during bolus administration of intravenous contrast. Multiplanar reconstructed images and MIPs were obtained and reviewed to evaluate the vascular anatomy.  CONTRAST:  125 cc Isovue 370  COMPARISON:  None.  FINDINGS: VASCULAR  Aorta: Very minor atherosclerotic change. Negative for aneurysm, dissection, occlusive process or retroperitoneal hemorrhage. No acute vascular finding.  Celiac: Widely patent including its branches  SMA: Widely patent including its branches  Renals: Widely patent  IMA: Patent origin off of the distal aorta  Inflow: Mild iliac tortuosity but no iliac stenosis, occlusion, dissection or aneurysm.  Proximal  Outflow: Common femoral, proximal profunda femoral, proximal superficial femoral arteries are also all patent.  Veins: Hepatic, portal, splenic, mesenteric and renal veins are all patent. IVC and iliac veins are patent. No veno-occlusive process.  Review of  the MIP images confirms the above findings.  NON-VASCULAR  Lower chest: Minor dependent bibasilar atelectasis. No acute lower chest finding. No pericardial or pleural effusion.  Hepatobiliary: No focal liver abnormality is seen. No gallstones, gallbladder wall thickening, or biliary dilatation.  Pancreas: Unremarkable. No pancreatic ductal dilatation or surrounding inflammatory changes.  Spleen: Normal in size without focal abnormality.  Adrenals/Urinary Tract: Normal adrenal glands. No renal obstruction, hydronephrosis, hydroureter, or obstructing ureteral calculus. Bladder unremarkable. Tiny incidental left cortical hypodense cyst. Nonobstructing 4 mm calculus in the left kidney upper pole, image 36.  Stomach/Bowel: Mild small bowel wall thickening and mucosal enhancement of the proximal jejunum in the left abdomen involving several loops. Appearance compatible with small bowel enteritis. No associated obstruction, ileus, free air, fluid collection, abscess, or pneumatosis. More distal small bowel is nondilated and fluid-filled.  Normal appearing appendix. No colonic abnormality. Colon is underdistended.  Lymphatic: No adenopathy.  Reproductive: Prostate normal in size. Seminal vesicles are symmetric.  Other: No abdominal wall hernia or abnormality. No abdominopelvic ascites.  Musculoskeletal: Degenerative changes of the spine diffusely. No acute osseous finding.  IMPRESSION: VASCULAR  Minor aortic atherosclerosis. Negative for aneurysm or dissection. No occlusive process.  Patent mesenteric and renal vasculature.  No acute or abnormal vascular finding.  NON-VASCULAR  Several loops  of proximal jejunum with mild wall thickening and mucosal enhancement compatible with acute enteritis. No associated obstruction, perforation, fluid collection or abscess. No free air.  Nonobstructing left nephrolithiasis   Electronically Signed   By: Judie Petit.  Shick M.D.   On: 03/15/2017 21:57  Results for EDUARD, PENKALA (MRN [id number]) as of 04/12/2017 15:12  Ref. Range 04/11/2017 16:29  Scan Result Unknown 37    I have independently reviewed the films.    Assessment & Plan:   1. Left nephrolithiasis  - not the source of his right sided pain or urinary issues at this time  - no intervention at this time  - will monitor with yearly KUB's  -Advised to contact our office or seek treatment in the ED if becomes febrile or pain/ vomiting are difficult control in order to arrange for emergent/urgent intervention   2. Left renal cyst  - most likely benign and not the source of his right side pain  3. BPH with LUTS  - IPSS score is 12/4  - Continue conservative management, avoiding bladder irritants and timed voiding's  - most bothersome symptoms is/are nocturia and post void dribbling  - Initiate alpha-blocker (tamsulosin 0.4 mg), discussed side effects   - RTC in one months for I PSS and PVR  4. Nocturia  - I explained to the patient that nocturia is often multi-factorial and difficult to treat.  Sleeping disorders, heart conditions, peripheral vascular disease, diabetes, an enlarged prostate for men, an urethral stricture causing bladder outlet obstruction and/or certain medications can contribute to nocturia.  - I have suggested that the patient avoid caffeine after noon and alcohol in the evening.  He or she may also benefit from fluid restrictions after 6:00 in the evening and voiding just prior to bedtime.  - I have explained that research studies have showed that over 84% of patients with sleep apnea reported frequent nighttime urination.   With sleep apnea, oxygen  decreases, carbon dioxide increases, the blood become more acidic, the heart rate drops and blood vessels in the lung constrict.  The body is then alerted that something is very wrong. The sleeper must wake enough to reopen the airway. By this time,  the heart is racing and experiences a false signal of fluid overload. The heart excretes a hormone-like protein that tells the body to get rid of sodium and water, resulting in nocturia.  -  I also informed the patient that a recent study noted that decreasing sodium intake to 2.3 grams daily, if they don't have issues with hyponatremia, can also reduce the number of nightly voids  - The patient may benefit from a discussion with his or her primary care physician to see if he or she has risk factors for sleep apnea or other sleep disturbances and obtaining a sleep study.  5. ED  - patient requested a prescription for Viagra at the end of the visit, but he has not been compliant with his heart medication and he does not currently have the PCP  - Gave the patient a list of PCPs accepting new patients and strongly advised him to get established with a PCP prior to any treatment for erectile dysfunction   Return in about 1 month (around 05/12/2017) for IPSS and PVR.  These notes generated with voice recognition software. I apologize for typographical errors.  Michiel Cowboy, PA-C  [facility] [address] [place], [place] [address] [phone]

## 2017-04-11 ENCOUNTER — Ambulatory Visit (INDEPENDENT_AMBULATORY_CARE_PROVIDER_SITE_OTHER): Payer: BLUE CROSS/BLUE SHIELD | Admitting: Urology

## 2017-04-11 ENCOUNTER — Encounter: Payer: Self-pay | Admitting: Urology

## 2017-04-11 VITALS — BP 156/113 | HR 103 | Ht 70.0 in | Wt 207.2 lb

## 2017-04-11 DIAGNOSIS — N281 Cyst of kidney, acquired: Secondary | ICD-10-CM

## 2017-04-11 DIAGNOSIS — N138 Other obstructive and reflux uropathy: Secondary | ICD-10-CM

## 2017-04-11 DIAGNOSIS — N529 Male erectile dysfunction, unspecified: Secondary | ICD-10-CM

## 2017-04-11 DIAGNOSIS — N401 Enlarged prostate with lower urinary tract symptoms: Secondary | ICD-10-CM | POA: Diagnosis not present

## 2017-04-11 DIAGNOSIS — N200 Calculus of kidney: Secondary | ICD-10-CM | POA: Diagnosis not present

## 2017-04-11 DIAGNOSIS — R351 Nocturia: Secondary | ICD-10-CM

## 2017-04-11 LAB — BLADDER SCAN AMB NON-IMAGING: Scan Result: 37

## 2017-04-11 MED ORDER — TAMSULOSIN HCL 0.4 MG PO CAPS: 0.4000 mg | ORAL_CAPSULE | Freq: Every day | ORAL | 3 refills | Status: DC

## 2017-04-12 ENCOUNTER — Telehealth: Payer: Self-pay

## 2017-04-12 LAB — PSA: Prostate Specific Ag, Serum: 0.9 ng/mL (ref 0.0–4.0)

## 2017-04-12 NOTE — Telephone Encounter (Signed)
Called patient.  No answer.

## 2017-04-12 NOTE — Telephone Encounter (Signed)
-----   Message from Harle BattiestShannon A McGowan, PA-C sent at 04/12/2017  8:02 AM EDT ----- Please let Cristal Deerhristopher know that his PSA is normal.

## 2017-04-13 NOTE — Telephone Encounter (Signed)
Called patient. No answer. Left vmail per DPR.  

## 2017-05-08 NOTE — Progress Notes (Signed)
05/09/2017 10:14 PM   Rickey Lane [birth year] [id number]  Referring provider: No referring provider defined for this encounter.  Chief Complaint  Patient presents with  . Renal Cyst  . Nephrolithiasis    HPI: Patient is a 51 year old Caucasian male with a left renal stone, renal cyst, BPH with LU TS, nocturia and ED who presents for a one month follow up after starting tamsulosin.    Left renal stone A CT angio abd/pel w/wo was performed on 03/15/2017 noted several loops of proximal jejunum with mild wall thickening and mucosal enhancement compatible with acute enteritis. No associated obstruction, perforation, fluid collection or abscess. No free air.  Nonobstructing left nephrolithiasis.  Normal adrenal glands. No renal obstruction, hydronephrosis, hydroureter, or obstructing ureteral calculus.  Bladder unremarkable. Tiny incidental left cortical hypodense cyst.  Nonobstructing 4 mm calculus in the left kidney upper pole.  He does have a prior history of stones.    Left renal cyst See above  BPH WITH LUTS  (prostate and/or bladder) His IPSS score today is 11, which is moderate lower urinary tract symptomatology.   He is mostly dissatisfied with his quality life due to his urinary symptoms. His PVR is 11 mL.   His previous I PSS score was 12/4.  His previous PVR was 37 mL.    His major complaints today are frequency x 9, nocturia x 5 and incontinence (post void dribbling).  He has had these symptoms for last few months.  He denies any dysuria, mostly dissatisfied hematuria or suprapubic pain.   He did not find the tamsulosin effective and he did not like the side effect of retrograde ejaculation.    He also denies any recent fevers, chills, nausea or vomiting.  His maternal grandfather had prostate cancer.  IPSS    Row Name 04/11/17 1600 05/09/17 1600       International Prostate Symptom Score   How often have you had the sensation of not emptying your bladder?   Not at All  Not at All    How often have you had to urinate less than every two hours?  About half the time  About half the time    How often have you found you stopped and started again several times when you urinated?  Not at All  Not at All    How often have you found it difficult to postpone urination?  Almost always  Almost always    How often have you had a weak urinary stream?  Not at All  Not at All    How often have you had to strain to start urination?  Not at All  Not at All    How many times did you typically get up at night to urinate?  4 Times  3 Times    Total IPSS Score  12  11      Quality of Life due to urinary symptoms   If you were to spend the rest of your life with your urinary condition just the way it is now how would you feel about that?  Mostly Disatisfied  Mostly Disatisfied       Score:  1-7 Mild 8-19 Moderate 20-35 Severe   PMH: Past Medical History:  Diagnosis Date  . Atrial fibrillation (HCC)    a. initially diagnosed in 09/2015. Reported a history of "irregular HR" for 3+ years  . Dysrhythmia   . GERD (gastroesophageal reflux disease)   . Headache   .  Kidney stone     Surgical History: Past Surgical History:  Procedure Laterality Date  . CARDIOVERSION N/A 11/04/2015   Performed by [place] OuchArida, Muhammad A, [facility]  . lithotripsy      Home Medications:  Allergies as of 05/09/2017      Reactions   Nitroglycerin Other (See Comments)   "Shivering" and "decreased heart rate"      Medication List        Accurate as of 05/09/17 11:59 PM. Always use your most recent med list.          ranitidine 150 MG tablet Commonly known as:  ZANTAC Take 150 mg by mouth daily as needed for heartburn.   tamsulosin 0.4 MG Caps capsule Commonly known as:  FLOMAX Take 1 capsule (0.4 mg total) by mouth daily.       Allergies:  Allergies  Allergen Reactions  . Nitroglycerin Other (See Comments)    "Shivering" and "decreased heart rate"     Family History: Family History  Problem Relation Age of Onset  . Heart attack Father        Initial MI age 50, deceased in his 4960's from a massive MI  . Heart attack Maternal Aunt 55  . Prostate cancer Maternal Grandfather   . Kidney cancer Neg Hx   . Bladder Cancer Neg Hx     Social History:  reports that  has never smoked. he has never used smokeless tobacco. He reports that he does not drink alcohol or use drugs.  ROS: UROLOGY Frequent Urination?: Yes Hard to postpone urination?: No Burning/pain with urination?: No Get up at night to urinate?: No Leakage of urine?: No Urine stream starts and stops?: No Trouble starting stream?: No Do you have to strain to urinate?: No Blood in urine?: No Urinary tract infection?: No Sexually transmitted disease?: No Injury to kidneys or bladder?: No Painful intercourse?: No Weak stream?: No Erection problems?: No  Gastrointestinal Nausea?: No Vomiting?: No Indigestion/heartburn?: No Diarrhea?: No Constipation?: No  Constitutional Fever: No Night sweats?: No Weight loss?: No Fatigue?: Yes  Skin Skin rash/lesions?: No Itching?: No  Eyes Blurred vision?: No Double vision?: No  Ears/Nose/Throat Sore throat?: No Sinus problems?: No  Hematologic/Lymphatic Swollen glands?: No Easy bruising?: No  Cardiovascular Leg swelling?: No Chest pain?: No  Respiratory Cough?: No Shortness of breath?: No  Endocrine Excessive thirst?: No  Musculoskeletal Back pain?: No Joint pain?: No  Neurological Headaches?: No Dizziness?: No  Psychologic Depression?: No Anxiety?: No  Physical Exam: BP (!) 171/92   Pulse 96   Ht 5\' 10"  (1.778 m)   Wt 205 lb (93 kg)   BMI 29.41 kg/m   Constitutional: Well nourished. Alert and oriented, No acute distress. HEENT: [place] AT, moist mucus membranes. Trachea midline, no masses. Cardiovascular: No clubbing, cyanosis, or edema. Respiratory: Normal respiratory effort, no  increased work of breathing. Skin: No rashes, bruises or suspicious lesions. Lymph: No cervical or inguinal adenopathy. Neurologic: Grossly intact, no focal deficits, moving all 4 extremities. Psychiatric: Normal mood and affect.  Laboratory Data: Lab Results  Component Value Date   WBC 9.7 03/15/2017   HGB 15.9 03/15/2017   HCT 44.9 03/15/2017   MCV 87.2 03/15/2017   PLT 190 03/15/2017    Lab Results  Component Value Date   CREATININE 0.64 03/15/2017    Lab Results  Component Value Date   PSA 0.61 09/30/2015    Lab Results  Component Value Date   AST 33 03/15/2017  Lab Results  Component Value Date   ALT 26 03/15/2017   I have reviewed the labs.   Pertinent Imaging: CLINICAL DATA:  Right upper quadrant pain, right flank pain, suspect gastroenteritis or colitis  EXAM: CT ANGIOGRAPHY ABDOMEN AND PELVIS WITH CONTRAST AND WITHOUT CONTRAST  TECHNIQUE: Multidetector CT imaging of the abdomen and pelvis was performed using the standard protocol during bolus administration of intravenous contrast. Multiplanar reconstructed images and MIPs were obtained and reviewed to evaluate the vascular anatomy.  CONTRAST:  125 cc Isovue 370  COMPARISON:  None.  FINDINGS: VASCULAR  Aorta: Very minor atherosclerotic change. Negative for aneurysm, dissection, occlusive process or retroperitoneal hemorrhage. No acute vascular finding.  Celiac: Widely patent including its branches  SMA: Widely patent including its branches  Renals: Widely patent  IMA: Patent origin off of the distal aorta  Inflow: Mild iliac tortuosity but no iliac stenosis, occlusion, dissection or aneurysm.  Proximal Outflow: Common femoral, proximal profunda femoral, proximal superficial femoral arteries are also all patent.  Veins: Hepatic, portal, splenic, mesenteric and renal veins are all patent. IVC and iliac veins are patent. No veno-occlusive process.  Review of the MIP  images confirms the above findings.  NON-VASCULAR  Lower chest: Minor dependent bibasilar atelectasis. No acute lower chest finding. No pericardial or pleural effusion.  Hepatobiliary: No focal liver abnormality is seen. No gallstones, gallbladder wall thickening, or biliary dilatation.  Pancreas: Unremarkable. No pancreatic ductal dilatation or surrounding inflammatory changes.  Spleen: Normal in size without focal abnormality.  Adrenals/Urinary Tract: Normal adrenal glands. No renal obstruction, hydronephrosis, hydroureter, or obstructing ureteral calculus. Bladder unremarkable. Tiny incidental left cortical hypodense cyst. Nonobstructing 4 mm calculus in the left kidney upper pole, image 36.  Stomach/Bowel: Mild small bowel wall thickening and mucosal enhancement of the proximal jejunum in the left abdomen involving several loops. Appearance compatible with small bowel enteritis. No associated obstruction, ileus, free air, fluid collection, abscess, or pneumatosis. More distal small bowel is nondilated and fluid-filled.  Normal appearing appendix. No colonic abnormality. Colon is underdistended.  Lymphatic: No adenopathy.  Reproductive: Prostate normal in size. Seminal vesicles are symmetric.  Other: No abdominal wall hernia or abnormality. No abdominopelvic ascites.  Musculoskeletal: Degenerative changes of the spine diffusely. No acute osseous finding.  IMPRESSION: VASCULAR  Minor aortic atherosclerosis. Negative for aneurysm or dissection. No occlusive process.  Patent mesenteric and renal vasculature.  No acute or abnormal vascular finding.  NON-VASCULAR  Several loops of proximal jejunum with mild wall thickening and mucosal enhancement compatible with acute enteritis. No associated obstruction, perforation, fluid collection or abscess. No free air.  Nonobstructing left nephrolithiasis   Electronically Signed   By: Judie Petit.  Shick  M.D.   On: 03/15/2017 21:57  Assessment & Plan:   1. Left nephrolithiasis  - not the source of his right sided pain or urinary issues at this time  - no intervention at this time  - will monitor with yearly KUB's  -Advised to contact our office or seek treatment in the ED if becomes febrile or pain/ vomiting are difficult control in order to arrange for emergent/urgent intervention   2. Left renal cyst  - most likely benign and not the source of his right side pain  3. BPH with LUTS  - IPSS score is 11/4, it is slightly improved  - Continue conservative management, avoiding bladder irritants and timed voiding's  - most bothersome symptoms is/are nocturia and post void dribbling  - discontinue tamsulosin 0.4 mg  - given Gala Murdoch  4 mg daily samples, #28;  advised of the side effects such as: Dry eyes, dry mouth, constipation, mental confusion and/or urinary retention.  - RTC in 3 weeks for I PSS and PVR  4. Nocturia  - [facility] and will discuss the possibility of sleep apnea  5. ED  - patient with uncontrolled HTN   - will check a testosterone level per AUA guidelines for ED  - Gave the patient a list of PCPs accepting new patients and strongly advised him to get established with a PCP prior to any treatment for erectile dysfunction - [facility]   Return in about 3 weeks (around 05/30/2017) for testosterone and PSA before 10 am, IPSS and PVR.  These notes generated with voice recognition software. I apologize for typographical errors.  Michiel CowboySHANNON Wylene Weissman, PA-C  [facility] [address], [address] [place], [place] [address] [phone]

## 2017-05-09 ENCOUNTER — Ambulatory Visit (INDEPENDENT_AMBULATORY_CARE_PROVIDER_SITE_OTHER): Payer: BLUE CROSS/BLUE SHIELD | Admitting: Urology

## 2017-05-09 ENCOUNTER — Encounter: Payer: Self-pay | Admitting: Urology

## 2017-05-09 VITALS — BP 171/92 | HR 96 | Ht 70.0 in | Wt 205.0 lb

## 2017-05-09 DIAGNOSIS — N138 Other obstructive and reflux uropathy: Secondary | ICD-10-CM

## 2017-05-09 DIAGNOSIS — N529 Male erectile dysfunction, unspecified: Secondary | ICD-10-CM | POA: Diagnosis not present

## 2017-05-09 DIAGNOSIS — N401 Enlarged prostate with lower urinary tract symptoms: Secondary | ICD-10-CM

## 2017-05-09 DIAGNOSIS — R351 Nocturia: Secondary | ICD-10-CM

## 2017-05-09 DIAGNOSIS — N281 Cyst of kidney, acquired: Secondary | ICD-10-CM

## 2017-05-09 DIAGNOSIS — N200 Calculus of kidney: Secondary | ICD-10-CM | POA: Diagnosis not present

## 2017-05-09 LAB — BLADDER SCAN AMB NON-IMAGING: Scan Result: 11

## 2017-05-14 ENCOUNTER — Other Ambulatory Visit: Payer: BLUE CROSS/BLUE SHIELD

## 2017-05-14 DIAGNOSIS — N401 Enlarged prostate with lower urinary tract symptoms: Secondary | ICD-10-CM

## 2017-05-15 ENCOUNTER — Telehealth: Payer: Self-pay

## 2017-05-15 LAB — PSA: Prostate Specific Ag, Serum: 1.1 ng/mL (ref 0.0–4.0)

## 2017-05-15 NOTE — Telephone Encounter (Signed)
-----   Message from Harle BattiestShannon A McGowan, PA-C sent at 05/15/2017  7:28 AM EST ----- Patient needs to have a testosterone added to his blood work.

## 2017-05-15 NOTE — Telephone Encounter (Signed)
Labs were added.

## 2017-05-21 ENCOUNTER — Ambulatory Visit (INDEPENDENT_AMBULATORY_CARE_PROVIDER_SITE_OTHER): Payer: BLUE CROSS/BLUE SHIELD | Admitting: Physician Assistant

## 2017-05-21 ENCOUNTER — Other Ambulatory Visit: Payer: Self-pay

## 2017-05-21 ENCOUNTER — Encounter: Payer: Self-pay | Admitting: Physician Assistant

## 2017-05-21 VITALS — BP 160/90 | HR 85 | Resp 16 | Ht 70.0 in | Wt 202.8 lb

## 2017-05-21 DIAGNOSIS — Z7689 Persons encountering health services in other specified circumstances: Secondary | ICD-10-CM

## 2017-05-21 DIAGNOSIS — Z6829 Body mass index (BMI) 29.0-29.9, adult: Secondary | ICD-10-CM

## 2017-05-21 DIAGNOSIS — R0683 Snoring: Secondary | ICD-10-CM

## 2017-05-21 DIAGNOSIS — N529 Male erectile dysfunction, unspecified: Secondary | ICD-10-CM

## 2017-05-21 DIAGNOSIS — F17200 Nicotine dependence, unspecified, uncomplicated: Secondary | ICD-10-CM | POA: Insufficient documentation

## 2017-05-21 DIAGNOSIS — N3943 Post-void dribbling: Secondary | ICD-10-CM | POA: Insufficient documentation

## 2017-05-21 DIAGNOSIS — R400 Somnolence: Secondary | ICD-10-CM | POA: Diagnosis not present

## 2017-05-21 DIAGNOSIS — L649 Androgenic alopecia, unspecified: Secondary | ICD-10-CM

## 2017-05-21 DIAGNOSIS — N401 Enlarged prostate with lower urinary tract symptoms: Secondary | ICD-10-CM

## 2017-05-21 DIAGNOSIS — I4819 Other persistent atrial fibrillation: Secondary | ICD-10-CM

## 2017-05-21 DIAGNOSIS — N281 Cyst of kidney, acquired: Secondary | ICD-10-CM | POA: Insufficient documentation

## 2017-05-21 DIAGNOSIS — I481 Persistent atrial fibrillation: Secondary | ICD-10-CM

## 2017-05-21 DIAGNOSIS — I10 Essential (primary) hypertension: Secondary | ICD-10-CM

## 2017-05-21 DIAGNOSIS — N200 Calculus of kidney: Secondary | ICD-10-CM | POA: Insufficient documentation

## 2017-05-21 MED ORDER — FINASTERIDE 1 MG PO TABS: 1.0000 mg | ORAL_TABLET | Freq: Every day | ORAL | 0 refills | Status: DC

## 2017-05-21 NOTE — Progress Notes (Signed)
Name: Rickey Lane   MRN: [id number]    DOB: 05/11/1966   Date:05/21/2017       Progress Note  Subjective  Chief Complaint  Chief Complaint  Patient presents with  . Establish Care    HPI Patient is here to establish care.Patient with history of high Blood Pressure, Atrial Fibrillation.   Hypertension, follow-up:  BP Readings from Last 3 Encounters:  05/21/17 (!) 160/90  05/09/17 (!) 171/92  04/11/17 (!) 156/113   He is exercising.Reports walking everyday and weight lifting. He is not adherent to low salt diet.   Outside blood pressures are n/a. He is experiencing fatigue.  Patient denies chest pain, chest pressure/discomfort, exertional chest pressure/discomfort, lower extremity edema and near-syncope.   Cardiovascular risk factors include family history of premature cardiovascular disease, hypertension, male gender and smoking/ tobacco exposure.  Use of agents associated with hypertension: none.   Per patient he used to take Lisinopril but quit taking because he was on too much medication.  Sleep Apnea: Patient presents with possible obstructive sleep apnea. Patient generally gets 4 or 5 hours of sleep per night, and states they generally have nightime awakenings and generally restful sleep. Snoring of severe severity. Apneic episodes are present. Nasal obstruction are present.   No problem-specific Assessment & Plan notes found for this encounter.   Past Medical History:  Diagnosis Date  . Atrial fibrillation ([place])    a. initially diagnosed in 09/2015. Reported a history of "irregular HR" for 3+ years  . Dysrhythmia   . GERD (gastroesophageal reflux disease)   . Headache   . Kidney stone   . Sleep apnea     Past Surgical History:  Procedure Laterality Date  . ELECTROPHYSIOLOGIC STUDY N/A 11/04/2015   Procedure: CARDIOVERSION;  Surgeon: Wellington Hampshire, MD;  Location: [facility] ORS;  Service: Cardiovascular;  Laterality: N/A;  . lithotripsy      Family  History  Problem Relation Age of Onset  . Heart attack Father        Initial MI age 64, deceased in his 40's from a massive MI  . Heart attack Maternal Aunt 55  . Prostate cancer Maternal Grandfather   . Kidney cancer Neg Hx   . Bladder Cancer Neg Hx     Social History   Socioeconomic History  . Marital status: Single    Spouse name: Not on file  . Number of children: Not on file  . Years of education: Not on file  . Highest education level: Not on file  Social Needs  . Financial resource strain: Not on file  . Food insecurity - worry: Not on file  . Food insecurity - inability: Not on file  . Transportation needs - medical: Not on file  . Transportation needs - non-medical: Not on file  Occupational History  . Not on file  Tobacco Use  . Smoking status: Current Every Day Smoker    Packs/day: 1.00    Years: 20.00    Pack years: 20.00    Types: Cigarettes  . Smokeless tobacco: Never Used  Substance and Sexual Activity  . Alcohol use: No    Frequency: Never  . Drug use: No  . Sexual activity: Not on file  Other Topics Concern  . Not on file  Social History Narrative  . Not on file     Current Outpatient Medications:  .  acetaminophen (TYLENOL) 500 MG tablet, Take 500 mg by mouth every 6 (six) hours as needed., Disp: , Rfl:  .  ranitidine (ZANTAC) 150 MG tablet, Take 150 mg by mouth daily as needed for heartburn., Disp: , Rfl:  .  tamsulosin (FLOMAX) 0.4 MG CAPS capsule, Take 1 capsule (0.4 mg total) by mouth daily. (Patient not taking: Reported on 05/21/2017), Disp: 90 capsule, Rfl: 3  Allergies  Allergen Reactions  . Nitroglycerin Other (See Comments)    "Shivering" and "decreased heart rate"     Review of Systems  Constitutional: Positive for malaise/fatigue.  HENT: Negative.   Eyes: Negative.   Respiratory: Negative.   Cardiovascular: Negative.   Gastrointestinal: Negative.   Genitourinary: Positive for frequency and urgency.  Musculoskeletal:  Negative.   Skin: Negative.   Neurological: Negative.   Endo/Heme/Allergies: Negative.   Psychiatric/Behavioral: Negative.    Objective  Vitals:   05/21/17 1515  BP: (!) 160/90  Pulse: 85  Resp: 16  SpO2: 98%  Weight: 202 lb 12.8 oz (92 kg)  Height: '5\' 10"'$  (1.778 m)    Physical Exam  Constitutional: He is oriented to person, place, and time and well-developed, well-nourished, and in no distress. No distress.  HENT:  Head: Normocephalic and atraumatic.  Right Ear: Hearing, tympanic membrane, external ear and ear canal normal.  Left Ear: Hearing, tympanic membrane, external ear and ear canal normal.  Nose: Nose normal.  Mouth/Throat: Uvula is midline, oropharynx is clear and moist and mucous membranes are normal. No oropharyngeal exudate.  Eyes: Conjunctivae and EOM are normal. Pupils are equal, round, and reactive to light. Right eye exhibits no discharge. Left eye exhibits no discharge.  Neck: Normal range of motion. Neck supple. Carotid bruit is not present. No tracheal deviation present. No thyromegaly present.  Cardiovascular: Normal rate, normal heart sounds and intact distal pulses. An irregularly irregular rhythm present. Exam reveals no gallop and no friction rub.  No murmur heard. Pulmonary/Chest: Effort normal and breath sounds normal. No respiratory distress.  Abdominal: Soft. Bowel sounds are normal. There is no tenderness.  Musculoskeletal: Normal range of motion. He exhibits no edema.  Lymphadenopathy:    He has no cervical adenopathy.  Neurological: He is alert and oriented to person, place, and time.  Skin: Skin is warm and dry. He is not diaphoretic.  Psychiatric: Mood, memory, affect and judgment normal.  Vitals reviewed.   Recent Results (from the past 2160 hour(s))  CBC with Differential     Status: None   Collection Time: 03/15/17  7:29 PM  Result Value Ref Range   WBC 9.7 3.8 - 10.6 K/uL   RBC 5.15 4.40 - 5.90 MIL/uL   Hemoglobin 15.9 13.0 - 18.0  g/dL   HCT 44.9 40.0 - 52.0 %   MCV 87.2 80.0 - 100.0 fL   MCH 30.9 26.0 - 34.0 pg   MCHC 35.4 32.0 - 36.0 g/dL   RDW 13.0 11.5 - 14.5 %   Platelets 190 150 - 440 K/uL   Neutrophils Relative % 62 %   Neutro Abs 6.0 1.4 - 6.5 K/uL   Lymphocytes Relative 29 %   Lymphs Abs 2.8 1.0 - 3.6 K/uL   Monocytes Relative 7 %   Monocytes Absolute 0.7 0.2 - 1.0 K/uL   Eosinophils Relative 1 %   Eosinophils Absolute 0.1 0 - 0.7 K/uL   Basophils Relative 1 %   Basophils Absolute 0.0 0 - 0.1 K/uL  Protime-INR     Status: None   Collection Time: 03/15/17  7:38 PM  Result Value Ref Range   Prothrombin Time 13.1 11.4 - 15.2 seconds  INR 1.00   Lactic acid, plasma     Status: None   Collection Time: 03/15/17  7:38 PM  Result Value Ref Range   Lactic Acid, Venous 1.6 0.5 - 1.9 mmol/L  Comprehensive metabolic panel     Status: Abnormal   Collection Time: 03/15/17  8:05 PM  Result Value Ref Range   Sodium 143 135 - 145 mmol/L   Potassium 2.6 (LL) 3.5 - 5.1 mmol/L    Comment: HEMOLYSIS AT THIS LEVEL MAY AFFECT RESULT CRITICAL RESULT CALLED TO, READ BACK BY AND VERIFIED WITH TEREASA CLAPP AT 2113 03/15/17 ALV    Chloride 116 (H) 101 - 111 mmol/L   CO2 21 (L) 22 - 32 mmol/L   Glucose, Bld 123 (H) 65 - 99 mg/dL   BUN 14 6 - 20 mg/dL   Creatinine, Ser 0.64 0.61 - 1.24 mg/dL   Calcium 6.7 (L) 8.9 - 10.3 mg/dL   Total Protein 4.9 (L) 6.5 - 8.1 g/dL   Albumin 2.8 (L) 3.5 - 5.0 g/dL   AST 33 15 - 41 U/L    Comment: HEMOLYSIS AT THIS LEVEL MAY AFFECT RESULT   ALT 26 17 - 63 U/L   Alkaline Phosphatase 51 38 - 126 U/L   Total Bilirubin 1.0 0.3 - 1.2 mg/dL    Comment: HEMOLYSIS AT THIS LEVEL MAY AFFECT RESULT   GFR calc non Af Amer >60 >60 mL/min   GFR calc Af Amer >60 >60 mL/min    Comment: (NOTE) The eGFR has been calculated using the CKD EPI equation. This calculation has not been validated in all clinical situations. eGFR's persistently <60 mL/min signify possible Chronic Kidney Disease.     Anion gap 6 5 - 15  Lipase, blood     Status: None   Collection Time: 03/15/17  8:05 PM  Result Value Ref Range   Lipase 34 11 - 51 U/L  Troponin I     Status: None   Collection Time: 03/15/17  8:05 PM  Result Value Ref Range   Troponin I <0.03 <0.03 ng/mL  BLADDER SCAN AMB NON-IMAGING     Status: None   Collection Time: 04/11/17  4:29 PM  Result Value Ref Range   Scan Result 37   PSA     Status: None   Collection Time: 04/11/17  4:44 PM  Result Value Ref Range   Prostate Specific Ag, Serum 0.9 0.0 - 4.0 ng/mL    Comment: Roche ECLIA methodology. According to the American Urological Association, Serum PSA should decrease and remain at undetectable levels after radical prostatectomy. The AUA defines biochemical recurrence as an initial PSA value 0.2 ng/mL or greater followed by a subsequent confirmatory PSA value 0.2 ng/mL or greater. Values obtained with different assay methods or kits cannot be used interchangeably. Results cannot be interpreted as absolute evidence of the presence or absence of malignant disease.   Bladder Scan (Post Void Residual) in office     Status: None   Collection Time: 05/09/17  4:20 PM  Result Value Ref Range   Scan Result 11 ml   PSA     Status: None   Collection Time: 05/14/17  9:52 AM  Result Value Ref Range   Prostate Specific Ag, Serum 1.1 0.0 - 4.0 ng/mL    Comment: Roche ECLIA methodology. According to the American Urological Association, Serum PSA should decrease and remain at undetectable levels after radical prostatectomy. The AUA defines biochemical recurrence as an initial PSA value 0.2 ng/mL or greater  followed by a subsequent confirmatory PSA value 0.2 ng/mL or greater. Values obtained with different assay methods or kits cannot be used interchangeably. Results cannot be interpreted as absolute evidence of the presence or absence of malignant disease.   Testosterone, Total, LC/MS/MS     Status: None (Preliminary result)    Collection Time: 05/14/17  9:52 AM  Result Value Ref Range   Testosterone, total Comment 264.0 - 916.0 ng/dL    Comment: Result being verified. Final report to follow. This [facility] LC/MS-MS method is currently certified by the [employer] Program ([facility]). Adult male reference interval is based on a population of healthy nonobese males (BMI <30) between 98 and 74 years old. [place], [place] [phone]. PMID: [id number].   Specimen status report     Status: None   Collection Time: 05/14/17  9:52 AM  Result Value Ref Range   specimen status report Comment     Comment: Written Authorization Written Authorization Written Authorization Received. Authorization received from [facility] LPN [id number] Logged by [place] [facility]  1. Establishing care with new doctor, encounter for No previous PCP.   2. Benign prostatic hyperplasia with post-void dribbling Patient is currently being followed by Tonye Pearson, PA-C at [facility]. Reports symptoms being well controlled with Toviaz '4mg'$  currently. Failed Flomax.  - fesoterodine (TOVIAZ) 4 MG TB24 tablet; Take 1 tablet (4 mg total) by mouth daily.  Dispense: 30 tablet; Refill: 0  3. Persistent atrial fibrillation (HCC) EKG today reveals atrial fibrillation rate of 99. Discussed starting metoprolol for rate control as well as BP control but patient refuses. He states he doesn't want to add more medications, but also ask for Finasteride and Viagra at the same time. I refused Viagra due to cardiovascular risks at this time. Chadsvasc 2 score showed a 4% annual risk of stroke. Patient refuses anticoagulation at this time as well. He reports taking ginger root supplement for blood thinning properties. He has been seen by Dr. Fletcher Anon previously. He reports a cardioversion x 1 that lasted for one day then he flipped back into Afib. I have recommended for him to follow up with Dr. Fletcher Anon.  -  EKG 12-Lead  4. Essential hypertension Uncontrolled. Patient refuses medication. He is going to try to quit smoking and lose weight.   5. Erectile dysfunction, unspecified erectile dysfunction type Patient requesting viagra. I will not give Viagra due to cardiovascular risk since patient will not start BP medications.   6. Snoring Patient is agreeable to sleep study. This has been ordered as below. I do think he will need an in-lab study due to his atrial fibrillation. I will f/u pending results. - Ambulatory referral to Sleep Studies  7. Daytime somnolence See above medical treatment plan. - Ambulatory referral to Sleep Studies  8. BMI 29.0-29.9,adult See above medical treatment plan. Counseled patient on healthy lifestyle modifications including dieting and exercise.  - Ambulatory referral to Sleep Studies  9. Current every day smoker Discussed smoking cessation. Given information for [place] quitline.   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 Will give propecia as below. Discussed possible side effects of ED and sexual issue, since he already is having this issue, but patient still wishes to try. This was prescribed as below.  - finasteride (PROPECIA) 1 MG tablet; Take 1 tablet (1 mg total) by mouth daily.  Dispense: 30 tablet; Refill: 0

## 2017-05-21 NOTE — Patient Instructions (Signed)
Atrial Fibrillation Atrial fibrillation is a type of irregular or rapid heartbeat (arrhythmia). In atrial fibrillation, the heart quivers continuously in a chaotic pattern. This occurs when parts of the heart receive disorganized signals that make the heart unable to pump blood normally. This can increase the risk for stroke, heart failure, and other heart-related conditions. There are different types of atrial fibrillation, including:  Paroxysmal atrial fibrillation. This type starts suddenly, and it usually stops on its own shortly after it starts.  Persistent atrial fibrillation. This type often lasts longer than a week. It may stop on its own or with treatment.  Long-lasting persistent atrial fibrillation. This type lasts longer than 12 months.  Permanent atrial fibrillation. This type does not go away.  Talk with your health care provider to learn about the type of atrial fibrillation that you have. What are the causes? This condition is caused by some heart-related conditions or procedures, including:  A heart attack.  Coronary artery disease.  Heart failure.  Heart valve conditions.  High blood pressure.  Inflammation of the sac that surrounds the heart (pericarditis).  Heart surgery.  Certain heart rhythm disorders, such as Wolf-Parkinson-White syndrome.  Other causes include:  Pneumonia.  Obstructive sleep apnea.  Blockage of an artery in the lungs (pulmonary embolism, or PE).  Lung cancer.  Chronic lung disease.  Thyroid problems, especially if the thyroid is overactive (hyperthyroidism).  Caffeine.  Excessive alcohol use or illegal drug use.  Use of some medicines, including certain decongestants and diet pills.  Sometimes, the cause cannot be found. What increases the risk? This condition is more likely to develop in:  People who are older in age.  People who smoke.  People who have diabetes mellitus.  People who are overweight  (obese).  Athletes who exercise vigorously.  What are the signs or symptoms? Symptoms of this condition include:  A feeling that your heart is beating rapidly or irregularly.  A feeling of discomfort or pain in your chest.  Shortness of breath.  Sudden light-headedness or weakness.  Getting tired easily during exercise.  In some cases, there are no symptoms. How is this diagnosed? Your health care provider may be able to detect atrial fibrillation when taking your pulse. If detected, this condition may be diagnosed with:  An electrocardiogram (ECG).  A Holter monitor test that records your heartbeat patterns over a 24-hour period.  Transthoracic echocardiogram (TTE) to evaluate how blood flows through your heart.  Transesophageal echocardiogram (TEE) to view more detailed images of your heart.  A stress test.  Imaging tests, such as a CT scan or chest X-ray.  Blood tests.  How is this treated? The main goals of treatment are to prevent blood clots from forming and to keep your heart beating at a normal rate and rhythm. The type of treatment that you receive depends on many factors, such as your underlying medical conditions and how you feel when you are experiencing atrial fibrillation. This condition may be treated with:  Medicine to slow down the heart rate, bring the heart's rhythm back to normal, or prevent clots from forming.  Electrical cardioversion. This is a procedure that resets your heart's rhythm by delivering a controlled, low-energy shock to the heart through your skin.  Different types of ablation, such as catheter ablation, catheter ablation with pacemaker, or surgical ablation. These procedures destroy the heart tissues that send abnormal signals. When the pacemaker is used, it is placed under your skin to help your heart beat in   a regular rhythm.  Follow these instructions at home:  Take over-the counter and prescription medicines only as told by your  health care provider.  If your health care provider prescribed a blood-thinning medicine (anticoagulant), take it exactly as told. Taking too much blood-thinning medicine can cause bleeding. If you do not take enough blood-thinning medicine, you will not have the protection that you need against stroke and other problems.  Do not use tobacco products, including cigarettes, chewing tobacco, and e-cigarettes. If you need help quitting, ask your health care provider.  If you have obstructive sleep apnea, manage your condition as told by your health care provider.  Do not drink alcohol.  Do not drink beverages that contain caffeine, such as coffee, soda, and tea.  Maintain a healthy weight. Do not use diet pills unless your health care provider approves. Diet pills may make heart problems worse.  Follow diet instructions as told by your health care provider.  Exercise regularly as told by your health care provider.  Keep all follow-up visits as told by your health care provider. This is important. How is this prevented?  Avoid drinking beverages that contain caffeine or alcohol.  Avoid certain medicines, especially medicines that are used for breathing problems.  Avoid certain herbs and herbal medicines, such as those that contain ephedra or ginseng.  Do not use illegal drugs, such as cocaine and amphetamines.  Do not smoke.  Manage your high blood pressure. Contact a health care provider if:  You notice a change in the rate, rhythm, or strength of your heartbeat.  You are taking an anticoagulant and you notice increased bruising.  You tire more easily when you exercise or exert yourself. Get help right away if:  You have chest pain, abdominal pain, sweating, or weakness.  You feel nauseous.  You notice blood in your vomit, bowel movement, or urine.  You have shortness of breath.  You suddenly have swollen feet and ankles.  You feel dizzy.  You have sudden weakness or  numbness of the face, arm, or leg, especially on one side of the body.  You have trouble speaking, trouble understanding, or both (aphasia).  Your face or your eyelid droops on one side. These symptoms may represent a serious problem that is an emergency. Do not wait to see if the symptoms will go away. Get medical help right away. Call your local emergency services (911 in the [place] Do not drive yourself to the hospital. This information is not intended to replace advice given to you by your health care provider. Make sure you discuss any questions you have with your health care provider. Document Released: 06/12/2005 Document Revised: 10/20/2015 Document Reviewed: 10/07/2014 Elsevier Interactive Patient Education  2017 [employer]  

## 2017-05-24 LAB — TESTOSTERONE, TOTAL, LC/MS/MS: TESTOSTERONE, TOTAL: 645.5 ng/dL (ref 264.0–916.0)

## 2017-05-24 LAB — SPECIMEN STATUS REPORT

## 2017-05-25 ENCOUNTER — Telehealth: Payer: Self-pay

## 2017-05-25 NOTE — Telephone Encounter (Signed)
-----   Message from Harle BattiestShannon A McGowan, PA-C sent at 05/24/2017  8:06 AM EST ----- Please let Cristal DeerChristopher know that his PSA and testosterone are normal.

## 2017-05-25 NOTE — Telephone Encounter (Signed)
Will send a letter

## 2017-05-29 NOTE — Progress Notes (Signed)
05/30/2017 4:38 PM   Rickey Lane [birth year] [id number]  Referring provider: No referring provider defined for this encounter.  Chief Complaint  Patient presents with  . Benign Prostatic Hypertrophy  . Nephrolithiasis    HPI: Patient is a 51 year old Caucasian male with a left renal stone, renal cyst, BPH with LU TS, nocturia and ED who presents for a one month follow up after starting Toviaz.    Left renal stone A CT angio abd/pel w/wo was performed on 03/15/2017 noted several loops of proximal jejunum with mild wall thickening and mucosal enhancement compatible with acute enteritis. No associated obstruction, perforation, fluid collection or abscess. No free air.  Nonobstructing left nephrolithiasis.  Normal adrenal glands. No renal obstruction, hydronephrosis, hydroureter, or obstructing ureteral calculus.  Bladder unremarkable. Tiny incidental left cortical hypodense cyst.  Nonobstructing 4 mm calculus in the left kidney upper pole.  He does have a prior history of stones.    Left renal cyst See above  BPH WITH LUTS  (prostate and/or bladder) His IPSS score today is 10, which is moderate lower urinary tract symptomatology.   He is mostly dissatisfied with his quality life due to his urinary symptoms. His PVR is 19 mL.   His previous I PSS score was 11/5.  His previous PVR was 11 mL.    His major complaints today are frequency x 9, nocturia x 5 and incontinence (post void dribbling).  He has had these symptoms for last few months.  He denies any dysuria, mostly dissatisfied hematuria or suprapubic pain.   He did not find the tamsulosin effective and he did not like the side effect of retrograde ejaculation.  He was given Toviaz 4 mg samples at his visit 3 weeks ago.  He feels that they have reduced his urinary symptoms by half.    He also denies any recent fevers, chills, nausea or vomiting.  His maternal grandfather had prostate cancer.   IPSS    Row Name 04/11/17  1600 05/09/17 1600 05/30/17 1600     International Prostate Symptom Score   How often have you had the sensation of not emptying your bladder?  Not at All  Not at All  Not at All   How often have you had to urinate less than every two hours?  About half the time  About half the time  About half the time   How often have you found you stopped and started again several times when you urinated?  Not at All  Not at All  Not at All   How often have you found it difficult to postpone urination?  Almost always  Almost always  More than half the time   How often have you had a weak urinary stream?  Not at All  Not at All  Not at All   How often have you had to strain to start urination?  Not at All  Not at All  Not at All   How many times did you typically get up at night to urinate?  4 Times  3 Times  3 Times   Total IPSS Score  12  11  10      Quality of Life due to urinary symptoms   If you were to spend the rest of your life with your urinary condition just the way it is now how would you feel about that?  Mostly Disatisfied  Mostly Disatisfied  Mostly Disatisfied      Score:  1-7 Mild 8-19 Moderate 20-35 Severe   PMH: Past Medical History:  Diagnosis Date  . Atrial fibrillation (HCC)    a. initially diagnosed in 09/2015. Reported a history of "irregular HR" for 3+ years  . Dysrhythmia   . GERD (gastroesophageal reflux disease)   . Headache   . Kidney stone   . Sleep apnea     Surgical History: Past Surgical History:  Procedure Laterality Date  . ELECTROPHYSIOLOGIC STUDY N/A 11/04/2015   Procedure: CARDIOVERSION;  Surgeon: Iran Ouch, MD;  Location: [facility] ORS;  Service: Cardiovascular;  Laterality: N/A;  . lithotripsy      Home Medications:  Allergies as of 05/30/2017      Reactions   Nitroglycerin Other (See Comments)   "Shivering" and "decreased heart rate"      Medication List        Accurate as of 05/30/17  4:38 PM. Always use your most recent med list.            acetaminophen 500 MG tablet Commonly known as:  TYLENOL Take 500 mg by mouth every 6 (six) hours as needed.   fesoterodine 4 MG Tb24 tablet Commonly known as:  TOVIAZ Take 1 tablet (4 mg total) by mouth daily.   fesoterodine 4 MG Tb24 tablet Commonly known as:  TOVIAZ Take 1 tablet (4 mg total) by mouth daily.   finasteride 1 MG tablet Commonly known as:  PROPECIA Take 1 tablet (1 mg total) by mouth daily.   ranitidine 150 MG tablet Commonly known as:  ZANTAC Take 150 mg by mouth daily as needed for heartburn.       Allergies:  Allergies  Allergen Reactions  . Nitroglycerin Other (See Comments)    "Shivering" and "decreased heart rate"    Family History: Family History  Problem Relation Age of Onset  . Heart attack Father        Initial MI age 56, deceased in his 39's from a massive MI  . Heart attack Maternal Aunt 55  . Prostate cancer Maternal Grandfather   . Kidney cancer Neg Hx   . Bladder Cancer Neg Hx     Social History:  reports that he has been smoking cigarettes.  He has a 20.00 pack-year smoking history. he has never used smokeless tobacco. He reports that he does not drink alcohol or use drugs.  ROS: UROLOGY Frequent Urination?: Yes Hard to postpone urination?: Yes Burning/pain with urination?: No Get up at night to urinate?: No Leakage of urine?: No Urine stream starts and stops?: No Trouble starting stream?: No Do you have to strain to urinate?: No Blood in urine?: No Urinary tract infection?: No Sexually transmitted disease?: No Injury to kidneys or bladder?: No Painful intercourse?: No Weak stream?: No Erection problems?: No Penile pain?: No  Gastrointestinal Nausea?: No Vomiting?: No Indigestion/heartburn?: No Diarrhea?: No Constipation?: No  Constitutional Fever: No Night sweats?: No Weight loss?: No Fatigue?: No  Skin Skin rash/lesions?: No Itching?: No  Eyes Blurred vision?: No Double vision?:  No  Ears/Nose/Throat Sore throat?: No Sinus problems?: No  Hematologic/Lymphatic Swollen glands?: No Easy bruising?: No  Cardiovascular Leg swelling?: No Chest pain?: No  Respiratory Cough?: No Shortness of breath?: No  Endocrine Excessive thirst?: No  Musculoskeletal Back pain?: No Joint pain?: No  Neurological Headaches?: No Dizziness?: No  Psychologic Depression?: No Anxiety?: No  Physical Exam: BP (!) 141/93   Pulse 90   Ht 5\' 10"  (1.778 m)   Wt 198 lb 9.6 oz (  90.1 kg)   BMI 28.50 kg/m   Constitutional: Well nourished. Alert and oriented, No acute distress. HEENT: [place] AT, moist mucus membranes. Trachea midline, no masses. Cardiovascular: No clubbing, cyanosis, or edema. Respiratory: Normal respiratory effort, no increased work of breathing. Skin: No rashes, bruises or suspicious lesions. Lymph: No cervical or inguinal adenopathy. Neurologic: Grossly intact, no focal deficits, moving all 4 extremities. Psychiatric: Normal mood and affect.  Laboratory Data: Lab Results  Component Value Date   WBC 9.7 03/15/2017   HGB 15.9 03/15/2017   HCT 44.9 03/15/2017   MCV 87.2 03/15/2017   PLT 190 03/15/2017    Lab Results  Component Value Date   CREATININE 0.64 03/15/2017    Lab Results  Component Value Date   PSA 0.61 09/30/2015    Lab Results  Component Value Date   AST 33 03/15/2017   Lab Results  Component Value Date   ALT 26 03/15/2017   I have reviewed the labs.   Pertinent Imaging: CLINICAL DATA:  Right upper quadrant pain, right flank pain, suspect gastroenteritis or colitis  EXAM: CT ANGIOGRAPHY ABDOMEN AND PELVIS WITH CONTRAST AND WITHOUT CONTRAST  TECHNIQUE: Multidetector CT imaging of the abdomen and pelvis was performed using the standard protocol during bolus administration of intravenous contrast. Multiplanar reconstructed images and MIPs were obtained and reviewed to evaluate the vascular anatomy.  CONTRAST:  125  cc Isovue 370  COMPARISON:  None.  FINDINGS: VASCULAR  Aorta: Very minor atherosclerotic change. Negative for aneurysm, dissection, occlusive process or retroperitoneal hemorrhage. No acute vascular finding.  Celiac: Widely patent including its branches  SMA: Widely patent including its branches  Renals: Widely patent  IMA: Patent origin off of the distal aorta  Inflow: Mild iliac tortuosity but no iliac stenosis, occlusion, dissection or aneurysm.  Proximal Outflow: Common femoral, proximal profunda femoral, proximal superficial femoral arteries are also all patent.  Veins: Hepatic, portal, splenic, mesenteric and renal veins are all patent. IVC and iliac veins are patent. No veno-occlusive process.  Review of the MIP images confirms the above findings.  NON-VASCULAR  Lower chest: Minor dependent bibasilar atelectasis. No acute lower chest finding. No pericardial or pleural effusion.  Hepatobiliary: No focal liver abnormality is seen. No gallstones, gallbladder wall thickening, or biliary dilatation.  Pancreas: Unremarkable. No pancreatic ductal dilatation or surrounding inflammatory changes.  Spleen: Normal in size without focal abnormality.  Adrenals/Urinary Tract: Normal adrenal glands. No renal obstruction, hydronephrosis, hydroureter, or obstructing ureteral calculus. Bladder unremarkable. Tiny incidental left cortical hypodense cyst. Nonobstructing 4 mm calculus in the left kidney upper pole, image 36.  Stomach/Bowel: Mild small bowel wall thickening and mucosal enhancement of the proximal jejunum in the left abdomen involving several loops. Appearance compatible with small bowel enteritis. No associated obstruction, ileus, free air, fluid collection, abscess, or pneumatosis. More distal small bowel is nondilated and fluid-filled.  Normal appearing appendix. No colonic abnormality. Colon is underdistended.  Lymphatic: No  adenopathy.  Reproductive: Prostate normal in size. Seminal vesicles are symmetric.  Other: No abdominal wall hernia or abnormality. No abdominopelvic ascites.  Musculoskeletal: Degenerative changes of the spine diffusely. No acute osseous finding.  IMPRESSION: VASCULAR  Minor aortic atherosclerosis. Negative for aneurysm or dissection. No occlusive process.  Patent mesenteric and renal vasculature.  No acute or abnormal vascular finding.  NON-VASCULAR  Several loops of proximal jejunum with mild wall thickening and mucosal enhancement compatible with acute enteritis. No associated obstruction, perforation, fluid collection or abscess. No free air.  Nonobstructing left nephrolithiasis  Electronically Signed   By: Judie Petit.  Shick M.D.   On: 03/15/2017 21:57  Assessment & Plan:   1. Left nephrolithiasis  - not the source of his right sided pain or urinary issues at this time  - no intervention at this time  - will monitor with yearly KUB's  -Advised to contact our office or seek treatment in the ED if becomes febrile or pain/ vomiting are difficult control in order to arrange for emergent/urgent intervention   2. Left renal cyst  - most likely benign and not the source of his right side pain  3. BPH with LUTS  - IPSS score is 10/4, it is slightly improved  - Continue conservative management, avoiding bladder irritants and timed voiding's  - most bothersome symptoms is/are nocturia and post void dribbling  - discontinue tamsulosin 0.4 mg  - given script for Toviaz 4 mg daily  - RTC in 3 months for I PSS and PVR  4. Nocturia  - patient has seen Joycelyn Man, PA-C and is undergoing a sleep study and has also been referred on to Dr. Kirke Corin  5. ED  - patient with uncontrolled HTN - seeing Dr. Kirke Corin  - will check a testosterone level per AUA guidelines for ED - testosterone is normal    Return in about 3 months (around 08/28/2017) for IPSS and  PVR.  These notes generated with voice recognition software. I apologize for typographical errors.  Michiel Cowboy, PA-C  [facility] [address] [place], [place] [address] [phone]

## 2017-05-30 ENCOUNTER — Encounter: Payer: Self-pay | Admitting: Urology

## 2017-05-30 ENCOUNTER — Ambulatory Visit (INDEPENDENT_AMBULATORY_CARE_PROVIDER_SITE_OTHER): Payer: BLUE CROSS/BLUE SHIELD | Admitting: Urology

## 2017-05-30 VITALS — BP 141/93 | HR 90 | Ht 70.0 in | Wt 198.6 lb

## 2017-05-30 DIAGNOSIS — N401 Enlarged prostate with lower urinary tract symptoms: Secondary | ICD-10-CM

## 2017-05-30 DIAGNOSIS — R351 Nocturia: Secondary | ICD-10-CM | POA: Diagnosis not present

## 2017-05-30 DIAGNOSIS — N138 Other obstructive and reflux uropathy: Secondary | ICD-10-CM | POA: Diagnosis not present

## 2017-05-30 DIAGNOSIS — N3943 Post-void dribbling: Secondary | ICD-10-CM | POA: Diagnosis not present

## 2017-05-30 LAB — BLADDER SCAN AMB NON-IMAGING: Scan Result: 19

## 2017-05-30 MED ORDER — FESOTERODINE FUMARATE ER 4 MG PO TB24: 4.0000 mg | ORAL_TABLET | Freq: Every day | ORAL | 0 refills | Status: DC

## 2017-05-30 MED ORDER — FESOTERODINE FUMARATE ER 4 MG PO TB24: 4.0000 mg | ORAL_TABLET | Freq: Every day | ORAL | 11 refills | Status: DC

## 2017-06-01 ENCOUNTER — Telehealth: Payer: Self-pay | Admitting: Physician Assistant

## 2017-06-01 NOTE — Telephone Encounter (Signed)
Order for home sleep study faxed to [facility] °

## 2017-06-11 NOTE — Telephone Encounter (Signed)
Pt returned call from last week. Thanks TNP

## 2017-06-12 NOTE — Telephone Encounter (Signed)
LMTCB

## 2017-07-05 ENCOUNTER — Other Ambulatory Visit: Payer: Self-pay | Admitting: Physician Assistant

## 2017-07-05 DIAGNOSIS — L649 Androgenic alopecia, unspecified: Secondary | ICD-10-CM

## 2017-07-05 MED ORDER — FINASTERIDE 1 MG PO TABS: 1.0000 mg | ORAL_TABLET | Freq: Every day | ORAL | 0 refills | Status: DC

## 2017-07-05 NOTE — Telephone Encounter (Signed)
Pt contacted office for refill request on the following medications:  finasteride (PROPECIA) 1 MG tablet   [facility] [address]  Last Rx: 05/21/17 LOV: 05/21/17  Please advise. Thanks TNP

## 2017-07-17 ENCOUNTER — Encounter: Payer: Self-pay | Admitting: Physician Assistant

## 2017-08-28 ENCOUNTER — Ambulatory Visit (INDEPENDENT_AMBULATORY_CARE_PROVIDER_SITE_OTHER): Payer: BLUE CROSS/BLUE SHIELD | Admitting: Urology

## 2017-08-28 VITALS — BP 157/121 | HR 98 | Ht 70.0 in | Wt 200.0 lb

## 2017-08-28 DIAGNOSIS — N138 Other obstructive and reflux uropathy: Secondary | ICD-10-CM

## 2017-08-28 DIAGNOSIS — N401 Enlarged prostate with lower urinary tract symptoms: Secondary | ICD-10-CM

## 2017-08-28 DIAGNOSIS — N200 Calculus of kidney: Secondary | ICD-10-CM

## 2017-08-28 DIAGNOSIS — R351 Nocturia: Secondary | ICD-10-CM | POA: Diagnosis not present

## 2017-08-28 DIAGNOSIS — N281 Cyst of kidney, acquired: Secondary | ICD-10-CM | POA: Diagnosis not present

## 2017-08-28 DIAGNOSIS — N529 Male erectile dysfunction, unspecified: Secondary | ICD-10-CM | POA: Diagnosis not present

## 2017-08-28 MED ORDER — FINASTERIDE 5 MG PO TABS: 5.0000 mg | ORAL_TABLET | Freq: Every day | ORAL | 3 refills | Status: DC

## 2017-08-28 NOTE — Progress Notes (Signed)
08/28/2017 10:01 PM   [name] [birth year] [id number]  Referring provider: Margaretann Loveless, PA-C [address] [place], [place] [address]  Chief Complaint  Patient presents with  . Benign Prostatic Hypertrophy  . Nocturia    HPI: Patient is a 52 year old Caucasian male with a left renal stone, renal cyst, BPH with LU TS, nocturia and ED who presents for a three month follow up.    Left renal stone A CT angio abd/pel w/wo was performed on 03/15/2017 noted several loops of proximal jejunum with mild wall thickening and mucosal enhancement compatible with acute enteritis. No associated obstruction, perforation, fluid collection or abscess. No free air.  Nonobstructing left nephrolithiasis.  Normal adrenal glands. No renal obstruction, hydronephrosis, hydroureter, or obstructing ureteral calculus.  Bladder unremarkable. Tiny incidental left cortical hypodense cyst.  Nonobstructing 4 mm calculus in the left kidney upper pole.  He does have a prior history of stones.  No reports of gross hematuria, flank pain or stone passage since his last visit with [place].  Left renal cyst See above  BPH WITH LUTS  (prostate and/or bladder) His IPSS score today is 6, which is mild lower urinary tract symptomatology.   He is mixed with his quality life due to his urinary symptoms.   His previous I PSS score was 10/4.  His previous PVR was 19 mL.    His major complaints today are frequency, nocturia and incontinence (post void dribbling).  He has had these symptoms for last few months.  He denies any dysuria, mostly dissatisfied hematuria or suprapubic pain.   He did not find the tamsulosin effective and he did not like the side effect of retrograde ejaculation.  He was given Toviaz 4 mg samples at his visit 3 months ago.  He feels that they are no longer effective.  He was started on Propecia by his PCP for hair loss and stated that he noted an improvement in his urinary symptoms.   He would like to increase the dose of Propecia.  He also denies any recent fevers, chills, nausea or vomiting.  His maternal grandfather had prostate cancer.   IPSS    Row Name 08/28/17 1600         International Prostate Symptom Score   How often have you had the sensation of not emptying your bladder?  Not at All     How often have you had to urinate less than every two hours?  Less than half the time     How often have you found you stopped and started again several times when you urinated?  Not at All     How often have you found it difficult to postpone urination?  Less than half the time     How often have you had a weak urinary stream?  Not at All     How often have you had to strain to start urination?  Not at All     How many times did you typically get up at night to urinate?  2 Times     Total IPSS Score  6       Quality of Life due to urinary symptoms   If you were to spend the rest of your life with your urinary condition just the way it is now how would you feel about that?  Mixed        Score:  1-7 Mild 8-19 Moderate 20-35 Severe  Nocturia He is  now sleeping with a CPAP machine.  ED His testosterone level was normal at 645.5.    PMH: Past Medical History:  Diagnosis Date  . Atrial fibrillation (HCC)    a. initially diagnosed in 09/2015. Reported a history of "irregular HR" for 3+ years  . Dysrhythmia   . GERD (gastroesophageal reflux disease)   . Headache   . Kidney stone   . Sleep apnea     Surgical History: Past Surgical History:  Procedure Laterality Date  . ELECTROPHYSIOLOGIC STUDY N/A 11/04/2015   Procedure: CARDIOVERSION;  Surgeon: Iran OuchMuhammad A Arida, MD;  Location: [facility] ORS;  Service: Cardiovascular;  Laterality: N/A;  . lithotripsy      Home Medications:  Allergies as of 08/28/2017      Reactions   Nitroglycerin Other (See Comments)   "Shivering" and "decreased heart rate"      Medication List        Accurate as of 08/28/17 11:59 PM.  Always use your most recent med list.          acetaminophen 500 MG tablet Commonly known as:  TYLENOL Take 500 mg by mouth every 6 (six) hours as needed.   aspirin EC 325 MG tablet Take 325 mg by mouth daily.   fesoterodine 4 MG Tb24 tablet Commonly known as:  TOVIAZ Take 1 tablet (4 mg total) by mouth daily.   finasteride 1 MG tablet Commonly known as:  PROPECIA Take 1 tablet (1 mg total) by mouth daily.   finasteride 5 MG tablet Commonly known as:  PROSCAR Take 1 tablet (5 mg total) by mouth daily.   MULTI-VITAMINS Tabs Take by mouth.   ranitidine 150 MG tablet Commonly known as:  ZANTAC Take 150 mg by mouth daily as needed for heartburn.       Allergies:  Allergies  Allergen Reactions  . Nitroglycerin Other (See Comments)    "Shivering" and "decreased heart rate"    Family History: Family History  Problem Relation Age of Onset  . Heart attack Father        Initial MI age 730, deceased in his 6760's from a massive MI  . Heart attack Maternal Aunt 55  . Prostate cancer Maternal Grandfather   . Kidney cancer Neg Hx   . Bladder Cancer Neg Hx     Social History:  reports that he has been smoking cigarettes.  He has a 20.00 pack-year smoking history. he has never used smokeless tobacco. He reports that he does not drink alcohol or use drugs.  ROS: UROLOGY Frequent Urination?: Yes Hard to postpone urination?: No Burning/pain with urination?: No Get up at night to urinate?: Yes Leakage of urine?: No Urine stream starts and stops?: No Trouble starting stream?: No Do you have to strain to urinate?: No Blood in urine?: No Urinary tract infection?: No Sexually transmitted disease?: No Injury to kidneys or bladder?: No Painful intercourse?: No Weak stream?: No Erection problems?: No Penile pain?: No  Gastrointestinal Nausea?: No Vomiting?: No Indigestion/heartburn?: Yes Diarrhea?: No Constipation?: No  Constitutional Fever: No Night sweats?:  No Weight loss?: No Fatigue?: Yes  Skin Skin rash/lesions?: No Itching?: No  Eyes Blurred vision?: No Double vision?: No  Ears/Nose/Throat Sore throat?: No Sinus problems?: No  Hematologic/Lymphatic Swollen glands?: No Easy bruising?: No  Cardiovascular Leg swelling?: No Chest pain?: No  Respiratory Cough?: No Shortness of breath?: No  Endocrine Excessive thirst?: No  Musculoskeletal Back pain?: No Joint pain?: No  Neurological Headaches?: No Dizziness?: No  Psychologic Depression?:  No Anxiety?: No  Physical Exam: BP (!) 157/121   Pulse 98   Ht 5\' 10"  (1.778 m)   Wt 200 lb (90.7 kg)   BMI 28.70 kg/m   Constitutional: Well nourished. Alert and oriented, No acute distress. HEENT: [place] AT, moist mucus membranes. Trachea midline, no masses. Cardiovascular: No clubbing, cyanosis, or edema. Respiratory: Normal respiratory effort, no increased work of breathing. Skin: No rashes, bruises or suspicious lesions. Lymph: No cervical or inguinal adenopathy. Neurologic: Grossly intact, no focal deficits, moving all 4 extremities. Psychiatric: Normal mood and affect.  Laboratory Data: Lab Results  Component Value Date   WBC 9.7 03/15/2017   HGB 15.9 03/15/2017   HCT 44.9 03/15/2017   MCV 87.2 03/15/2017   PLT 190 03/15/2017    Lab Results  Component Value Date   CREATININE 0.64 03/15/2017    Lab Results  Component Value Date   PSA 0.61 09/30/2015    Lab Results  Component Value Date   AST 33 03/15/2017   Lab Results  Component Value Date   ALT 26 03/15/2017   I have reviewed the labs.   Pertinent Imaging: CLINICAL DATA:  Right upper quadrant pain, right flank pain, suspect gastroenteritis or colitis  EXAM: CT ANGIOGRAPHY ABDOMEN AND PELVIS WITH CONTRAST AND WITHOUT CONTRAST  TECHNIQUE: Multidetector CT imaging of the abdomen and pelvis was performed using the standard protocol during bolus administration of intravenous contrast.  Multiplanar reconstructed images and MIPs were obtained and reviewed to evaluate the vascular anatomy.  CONTRAST:  125 cc Isovue 370  COMPARISON:  None.  FINDINGS: VASCULAR  Aorta: Very minor atherosclerotic change. Negative for aneurysm, dissection, occlusive process or retroperitoneal hemorrhage. No acute vascular finding.  Celiac: Widely patent including its branches  SMA: Widely patent including its branches  Renals: Widely patent  IMA: Patent origin off of the distal aorta  Inflow: Mild iliac tortuosity but no iliac stenosis, occlusion, dissection or aneurysm.  Proximal Outflow: Common femoral, proximal profunda femoral, proximal superficial femoral arteries are also all patent.  Veins: Hepatic, portal, splenic, mesenteric and renal veins are all patent. IVC and iliac veins are patent. No veno-occlusive process.  Review of the MIP images confirms the above findings.  NON-VASCULAR  Lower chest: Minor dependent bibasilar atelectasis. No acute lower chest finding. No pericardial or pleural effusion.  Hepatobiliary: No focal liver abnormality is seen. No gallstones, gallbladder wall thickening, or biliary dilatation.  Pancreas: Unremarkable. No pancreatic ductal dilatation or surrounding inflammatory changes.  Spleen: Normal in size without focal abnormality.  Adrenals/Urinary Tract: Normal adrenal glands. No renal obstruction, hydronephrosis, hydroureter, or obstructing ureteral calculus. Bladder unremarkable. Tiny incidental left cortical hypodense cyst. Nonobstructing 4 mm calculus in the left kidney upper pole, image 36.  Stomach/Bowel: Mild small bowel wall thickening and mucosal enhancement of the proximal jejunum in the left abdomen involving several loops. Appearance compatible with small bowel enteritis. No associated obstruction, ileus, free air, fluid collection, abscess, or pneumatosis. More distal small bowel is nondilated  and fluid-filled.  Normal appearing appendix. No colonic abnormality. Colon is underdistended.  Lymphatic: No adenopathy.  Reproductive: Prostate normal in size. Seminal vesicles are symmetric.  Other: No abdominal wall hernia or abnormality. No abdominopelvic ascites.  Musculoskeletal: Degenerative changes of the spine diffusely. No acute osseous finding.  IMPRESSION: VASCULAR  Minor aortic atherosclerosis. Negative for aneurysm or dissection. No occlusive process.  Patent mesenteric and renal vasculature.  No acute or abnormal vascular finding.  NON-VASCULAR  Several loops of proximal jejunum with mild  wall thickening and mucosal enhancement compatible with acute enteritis. No associated obstruction, perforation, fluid collection or abscess. No free air.  Nonobstructing left nephrolithiasis   Electronically Signed   By: Judie Petit.  Shick M.D.   On: 03/15/2017 21:57  Assessment & Plan:   1. Left nephrolithiasis  - no intervention at this time  - will monitor with yearly KUB's 02/2018  -Advised to contact our office or seek treatment in the ED if becomes febrile or pain/ vomiting are difficult control in order to arrange for emergent/urgent intervention   2. Left renal cyst  - most likely benign   3. BPH with LUTS  - IPSS score is 6/3, it is slightly improved  - Continue conservative management, avoiding bladder irritants and timed voiding's  - most bothersome symptoms is/are frequency, nocturia and post void dribbling  - given a script for finasteride 5 mg daily  - RTC in 6 months for I PSS, PSA and exam  4. Nocturia - on CPAP    5. ED  - patient with uncontrolled HTN - seeing Dr. Kirke Corin  - will check a testosterone level per AUA guidelines for ED - testosterone is normal  - RTC in 6 months for SHIM      Return in about 6 months (around 02/28/2018) for IPSS, SHIM, PSA, KUB and exam.  These notes generated with voice recognition software. I  apologize for typographical errors.  Michiel Cowboy, PA-C  [facility] [address] [place], [place] [address] [phone]

## 2017-09-03 ENCOUNTER — Encounter: Payer: Self-pay | Admitting: Urology

## 2018-02-07 IMAGING — CT CT HEAD W/O CM
1 series · 16 of 30 positions shown, 20 images · non-contrast
Comparison: 12/08/2009

CLINICAL DATA: Acute onset frontal headache, nausea, and dizziness
beginning this morning.

EXAM:
CT HEAD WITHOUT CONTRAST
TECHNIQUE: Contiguous axial images were obtained from the base of the skull
through the vertex without intravenous contrast.

[Series 2: head wo · axial · 0.42mm/px · z∈[+454,+590]mm · 16 of 30 slices shown, 20 images]
[im 2/30  brain]
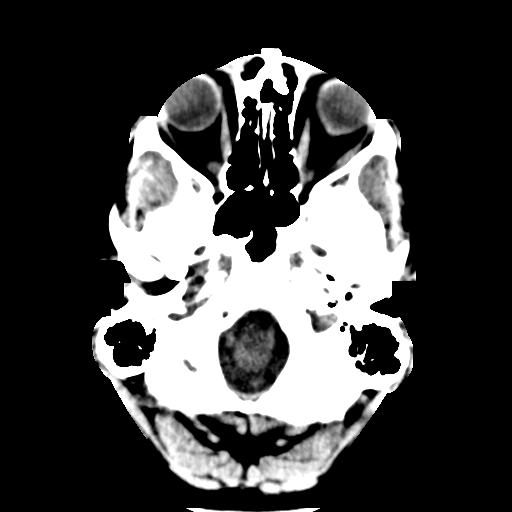
[im 2/30  bone]
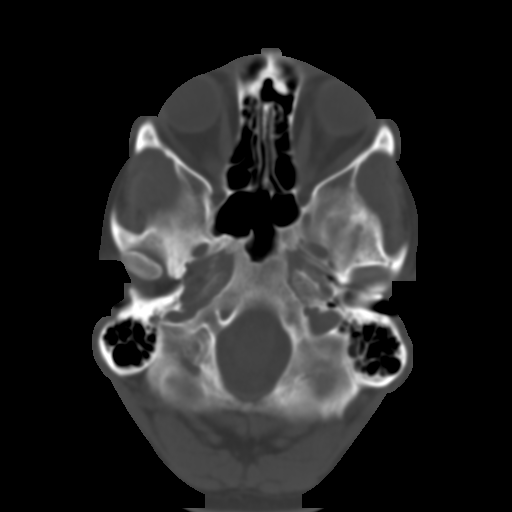
[im 4/30  brain]
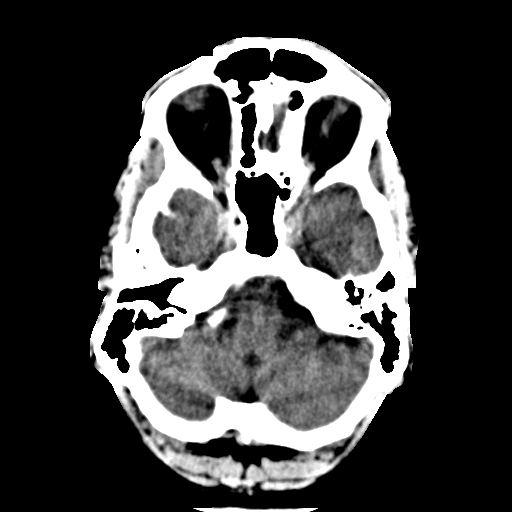
[im 6/30  brain]
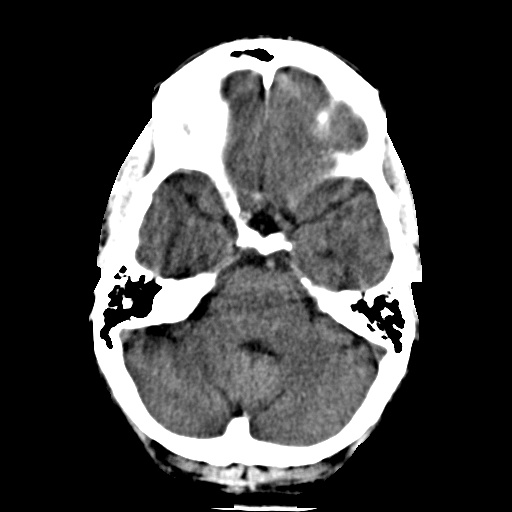
[im 8/30  brain]
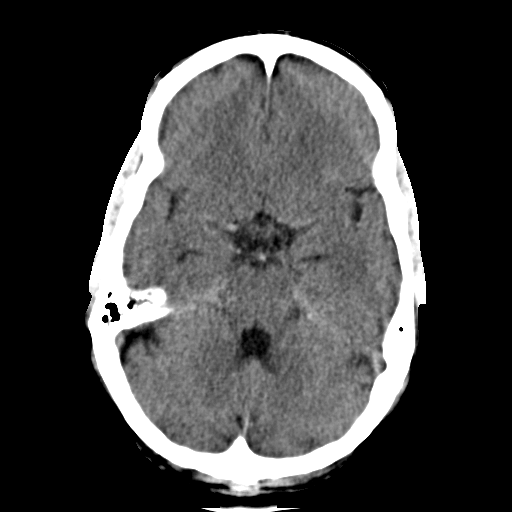
[im 9/30  brain]
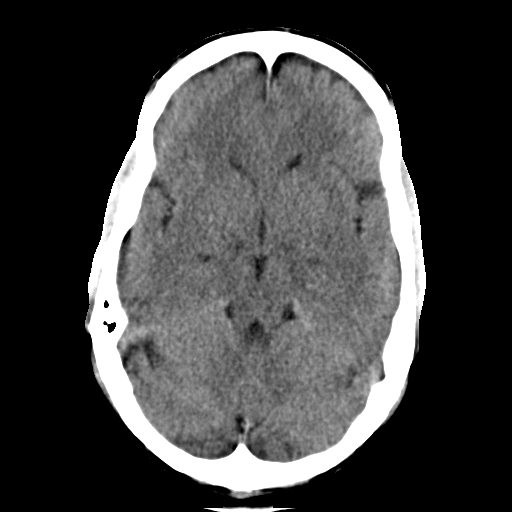
[im 9/30  bone]
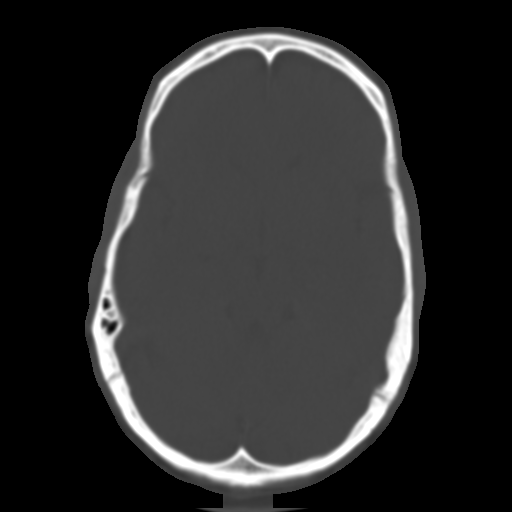
[im 11/30  brain]
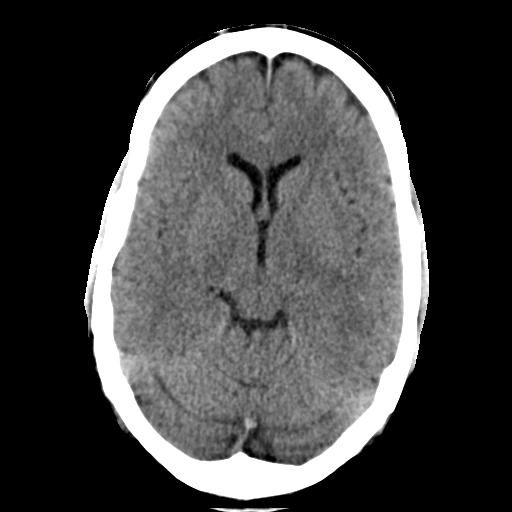
[im 13/30  brain]
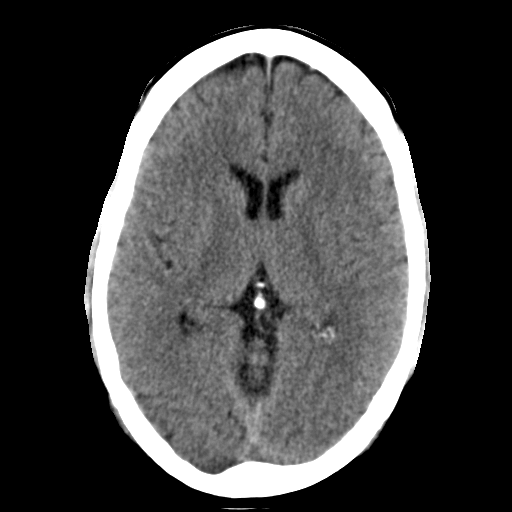
[im 15/30  brain]
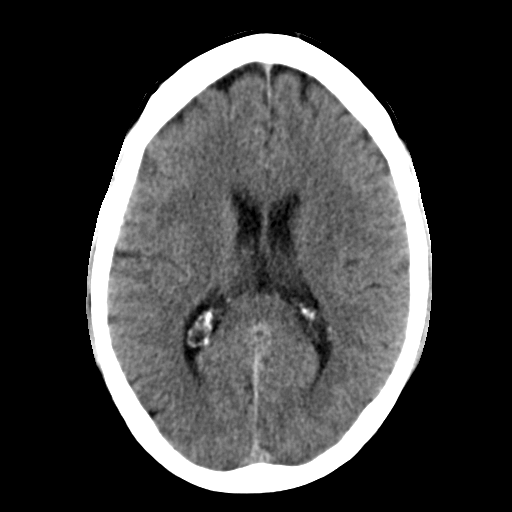
[im 16/30  brain]
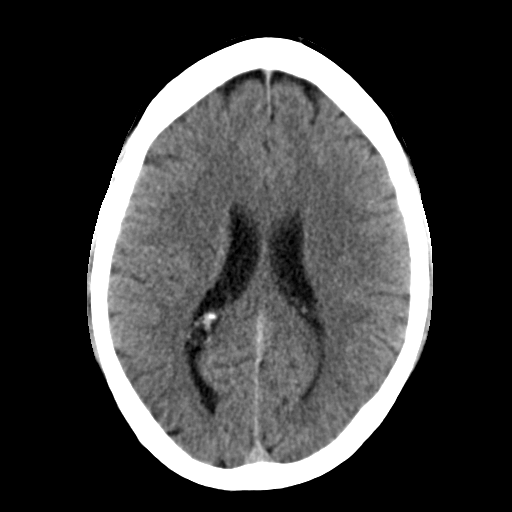
[im 16/30  bone]
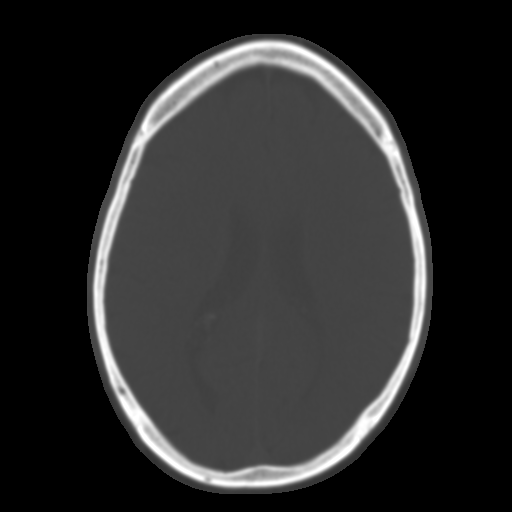
[im 18/30  brain]
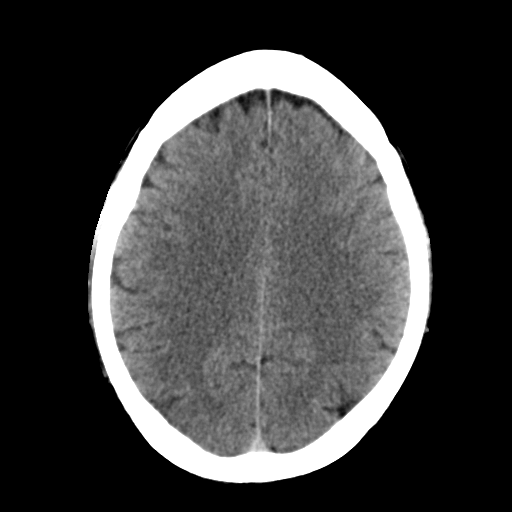
[im 20/30  brain]
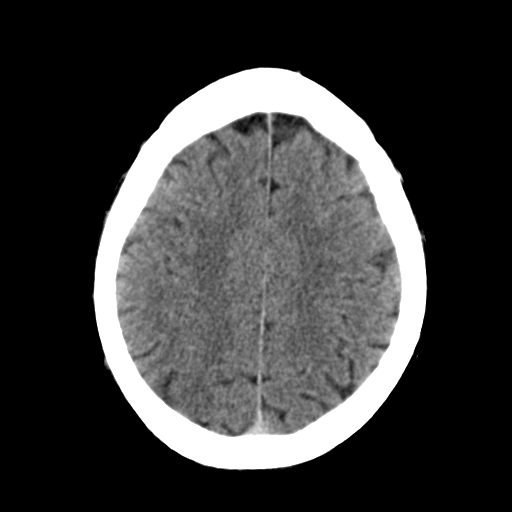
[im 22/30  brain]
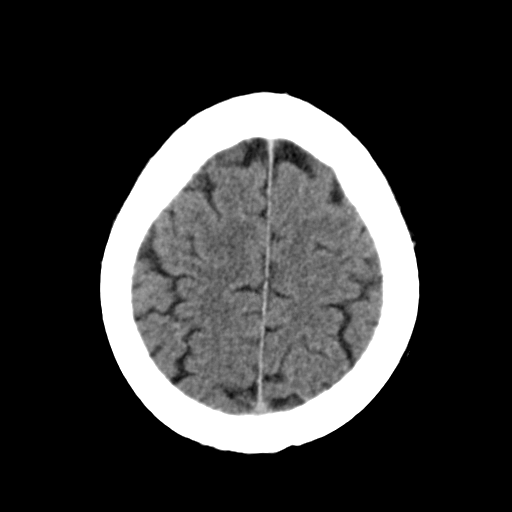
[im 23/30  brain]
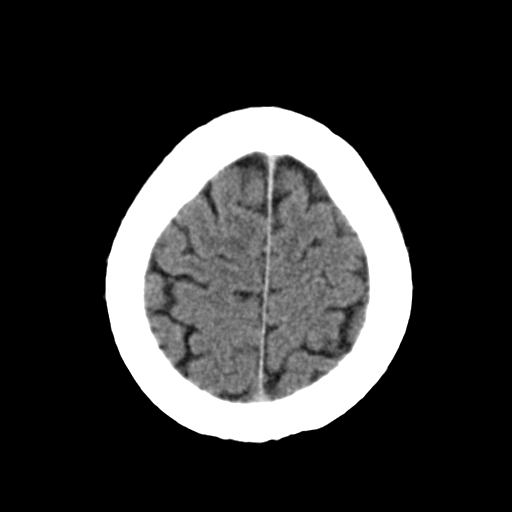
[im 23/30  bone]
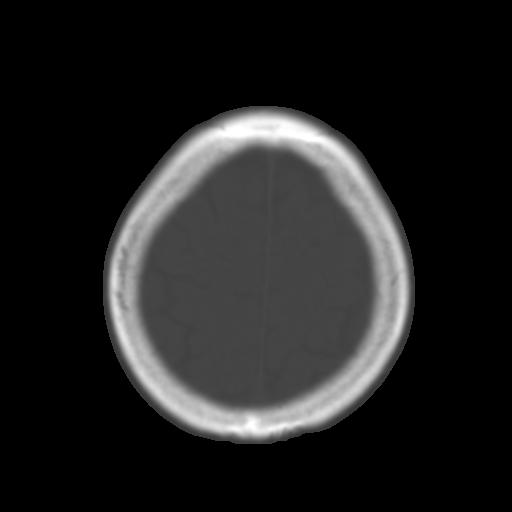
[im 25/30  brain]
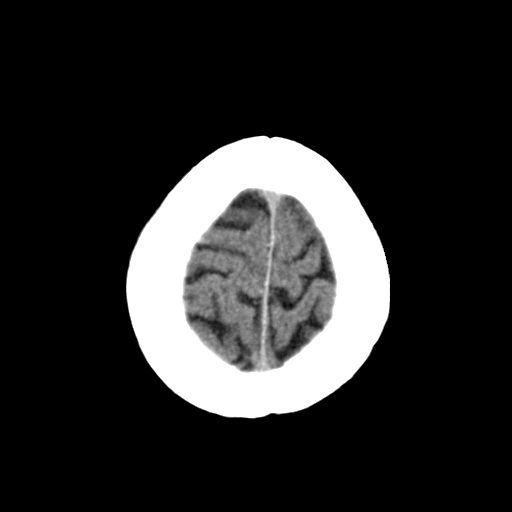
[im 27/30  brain]
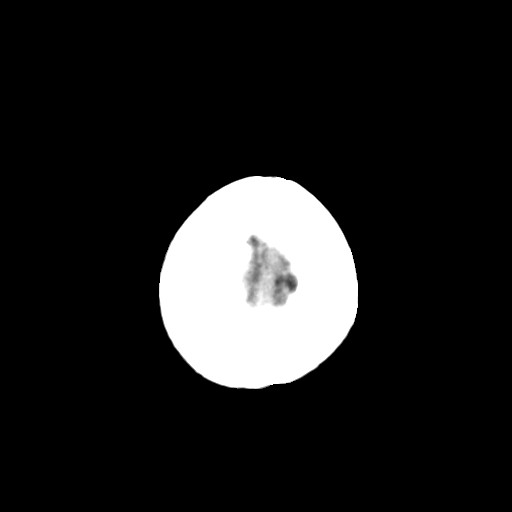
[im 29/30  brain]
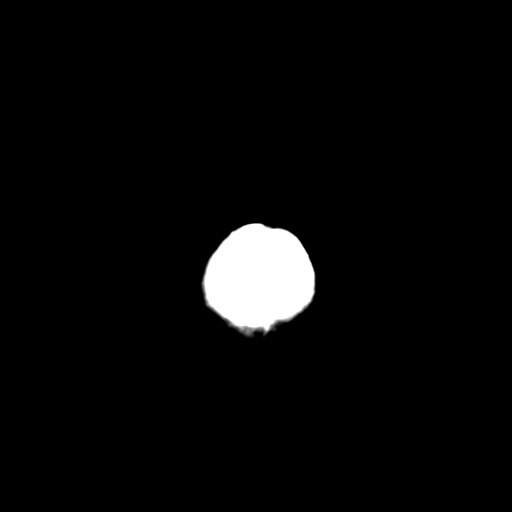

[16 of 30 positions shown; findings below may reference images not displayed]

FINDINGS: No evidence of intracranial hemorrhage, brain edema, or other signs
of acute infarction. No evidence of intracranial mass lesion or mass
effect.

No abnormal extraaxial fluid collections identified. Ventricles are
normal in size. No skull abnormality identified.
IMPRESSION: Negative noncontrast head CT.

## 2018-02-26 ENCOUNTER — Other Ambulatory Visit: Payer: Self-pay | Admitting: Radiology

## 2018-02-26 DIAGNOSIS — N200 Calculus of kidney: Secondary | ICD-10-CM

## 2018-03-05 ENCOUNTER — Other Ambulatory Visit: Payer: Self-pay

## 2018-03-05 ENCOUNTER — Other Ambulatory Visit
Admission: RE | Admit: 2018-03-05 | Discharge: 2018-03-05 | Disposition: A | Discharge status: Home / Self Care | Payer: BLUE CROSS/BLUE SHIELD | Source: Ambulatory Visit | Attending: Urology | Admitting: Urology

## 2018-03-05 ENCOUNTER — Other Ambulatory Visit: Payer: BLUE CROSS/BLUE SHIELD

## 2018-03-05 ENCOUNTER — Ambulatory Visit
Admission: RE | Admit: 2018-03-05 | Discharge: 2018-03-05 | Disposition: A | Discharge status: Home / Self Care | Payer: BLUE CROSS/BLUE SHIELD | Source: Ambulatory Visit | Attending: Urology | Admitting: Urology

## 2018-03-05 DIAGNOSIS — N200 Calculus of kidney: Secondary | ICD-10-CM

## 2018-03-05 DIAGNOSIS — N138 Other obstructive and reflux uropathy: Secondary | ICD-10-CM

## 2018-03-05 DIAGNOSIS — N401 Enlarged prostate with lower urinary tract symptoms: Principal | ICD-10-CM

## 2018-03-05 LAB — PSA: Prostatic Specific Antigen: 0.25 ng/mL (ref 0.00–4.00)

## 2018-03-06 ENCOUNTER — Encounter: Payer: Self-pay | Admitting: Urology

## 2018-03-07 ENCOUNTER — Ambulatory Visit: Payer: BLUE CROSS/BLUE SHIELD | Admitting: Urology

## 2018-03-07 ENCOUNTER — Encounter: Payer: Self-pay | Admitting: Urology

## 2018-03-07 ENCOUNTER — Ambulatory Visit (INDEPENDENT_AMBULATORY_CARE_PROVIDER_SITE_OTHER): Payer: BLUE CROSS/BLUE SHIELD | Admitting: Urology

## 2018-03-07 VITALS — BP 122/82 | HR 92 | Resp 15 | Ht 70.0 in | Wt 199.6 lb

## 2018-03-07 DIAGNOSIS — R351 Nocturia: Secondary | ICD-10-CM

## 2018-03-07 DIAGNOSIS — N200 Calculus of kidney: Secondary | ICD-10-CM

## 2018-03-07 DIAGNOSIS — N401 Enlarged prostate with lower urinary tract symptoms: Secondary | ICD-10-CM

## 2018-03-07 DIAGNOSIS — N138 Other obstructive and reflux uropathy: Secondary | ICD-10-CM

## 2018-03-07 DIAGNOSIS — N529 Male erectile dysfunction, unspecified: Secondary | ICD-10-CM

## 2018-03-07 DIAGNOSIS — N281 Cyst of kidney, acquired: Secondary | ICD-10-CM

## 2018-03-07 MED ORDER — SILDENAFIL CITRATE 20 MG PO TABS: ORAL_TABLET | ORAL | 3 refills | Status: DC

## 2018-03-07 NOTE — Progress Notes (Signed)
03/07/2018 4:06 PM   Rickey Lane 09/16/1965 [id number]  Referring provider: Margaretann LovelessBurnette, Jennifer M, PA-C [address] [place], [place] [id number]  Chief Complaint  Patient presents with  . Follow-up    HPI: Patient is a 52 year old Caucasian male with a left renal stone, renal cyst, BPH with LU TS, nocturia and ED who presents for a three month follow up.    Left renal stone A CT angio abd/pel w/wo was performed on 03/15/2017 noted several loops of proximal jejunum with mild wall thickening and mucosal enhancement compatible with acute enteritis. No associated obstruction, perforation, fluid collection or abscess. No free air.  Nonobstructing left nephrolithiasis.  Normal adrenal glands. No renal obstruction, hydronephrosis, hydroureter, or obstructing ureteral calculus.  Bladder unremarkable. Tiny incidental left cortical hypodense cyst.  Nonobstructing 4 mm calculus in the left kidney upper pole.  KUB taken on 03/05/2018 revealed left-sided kidney stones. No definite kidney stones or ureteral stones on the right. One cannot absolutely exclude a tiny distal right ureteral stone though this could reflect a phlebolith as well.  Patient currently denies any left-sided flank pain.  He states he has been having some right hip pain, but the pain is more lateral and is worsened with eating.    Left renal cyst See above  BPH WITH LUTS  (prostate and/or bladder) His IPSS score today is 6, which is mild lower urinary tract symptomatology.   He is mostly dissatisfied  with his quality life due to his urinary symptoms.   His previous I PSS score was 6/3.  His previous PVR was 19 mL.    His major complaints today are frequency and nocturia.  He is currently on finasteride 5 mg daily.  He was on tamsulosin but could not tolerate the retrograde ejaculation.  He is also tried on Toviaz, but he did not find it effective in controlling his frequency.  Patient denies any gross  hematuria, dysuria or suprapubic/flank pain.  Patient denies any fevers, chills, nausea or vomiting.   His maternal grandfather had prostate cancer.   IPSS    Row Name 03/07/18 1500         International Prostate Symptom Score   How often have you had the sensation of not emptying your bladder?  Not at All     How often have you had to urinate less than every two hours?  Less than half the time     How often have you found you stopped and started again several times when you urinated?  Not at All     How often have you found it difficult to postpone urination?  Less than half the time     How often have you had a weak urinary stream?  Not at All     How often have you had to strain to start urination?  Not at All     How many times did you typically get up at night to urinate?  2 Times     Total IPSS Score  6       Quality of Life due to urinary symptoms   If you were to spend the rest of your life with your urinary condition just the way it is now how would you feel about that?  Mostly Disatisfied        Score:  1-7 Mild 8-19 Moderate 20-35 Severe  Nocturia He could not tolerate his CPAP machine.  He is not getting up twice  nightly.    ED His testosterone level was normal at 645.5 in 04/2017.  He would like a prescription for sildenafil at this time.    PMH: Past Medical History:  Diagnosis Date  . Atrial fibrillation (HCC)    a. initially diagnosed in 09/2015. Reported a history of "irregular HR" for 3+ years  . Dysrhythmia   . GERD (gastroesophageal reflux disease)   . Headache   . Kidney stone   . Sleep apnea     Surgical History: Past Surgical History:  Procedure Laterality Date  . ELECTROPHYSIOLOGIC STUDY N/A 11/04/2015   Procedure: CARDIOVERSION;  Surgeon: Iran Ouch, MD;  Location: [facility] ORS;  Service: Cardiovascular;  Laterality: N/A;  . lithotripsy      Home Medications:  Allergies as of 03/07/2018      Reactions   Nitroglycerin Other (See  Comments)   "Shivering" and "decreased heart rate"      Medication List        Accurate as of 03/07/18  4:06 PM. Always use your most recent med list.          acetaminophen 500 MG tablet Commonly known as:  TYLENOL Take 500 mg by mouth every 6 (six) hours as needed.   aspirin EC 325 MG tablet Take 325 mg by mouth every 6 (six) hours as needed.   finasteride 5 MG tablet Commonly known as:  PROSCAR Take 1 tablet (5 mg total) by mouth daily.   MULTI-VITAMINS Tabs Take by mouth.   ranitidine 150 MG tablet Commonly known as:  ZANTAC Take 150 mg by mouth daily as needed for heartburn.   sildenafil 20 MG tablet Commonly known as:  REVATIO Take 3 to 5 tablets two hours before intercouse on an empty stomach.  Do not take with nitrates.       Allergies:  Allergies  Allergen Reactions  . Nitroglycerin Other (See Comments)    "Shivering" and "decreased heart rate"    Family History: Family History  Problem Relation Age of Onset  . Heart attack Father        Initial MI age 72, deceased in his 51's from a massive MI  . Heart attack Maternal Aunt 55  . Prostate cancer Maternal Grandfather   . Kidney cancer Neg Hx   . Bladder Cancer Neg Hx     Social History:  reports that he quit smoking about 6 weeks ago. His smoking use included cigarettes. He has a 20.00 pack-year smoking history. He has never used smokeless tobacco. He reports that he does not drink alcohol or use drugs.  ROS: UROLOGY Frequent Urination?: Yes Hard to postpone urination?: No Burning/pain with urination?: No Get up at night to urinate?: Yes Leakage of urine?: No Urine stream starts and stops?: No Trouble starting stream?: No Do you have to strain to urinate?: No Blood in urine?: No Urinary tract infection?: No Sexually transmitted disease?: No Injury to kidneys or bladder?: No Painful intercourse?: No Weak stream?: No Erection problems?: No Penile pain?: No  Gastrointestinal Nausea?:  No Vomiting?: No Indigestion/heartburn?: No Diarrhea?: No Constipation?: No  Constitutional Fever: No Night sweats?: No Weight loss?: No Fatigue?: No  Skin Skin rash/lesions?: No Itching?: No  Eyes Blurred vision?: No Double vision?: No  Ears/Nose/Throat Sore throat?: No Sinus problems?: No  Hematologic/Lymphatic Swollen glands?: No Easy bruising?: No  Cardiovascular Leg swelling?: No Chest pain?: No  Respiratory Cough?: No Shortness of breath?: No  Endocrine Excessive thirst?: No  Musculoskeletal Back pain?: No Joint  pain?: No  Neurological Headaches?: No Dizziness?: No  Psychologic Depression?: No Anxiety?: No  Physical Exam: BP 122/82   Pulse 92   Resp 15   Ht 5\' 10"  (1.778 m)   Wt 199 lb 9.6 oz (90.5 kg)   BMI 28.64 kg/m   Constitutional: Well nourished. Alert and oriented, No acute distress. HEENT: [place] AT, moist mucus membranes. Trachea midline, no masses. Cardiovascular: No clubbing, cyanosis, or edema. Respiratory: Normal respiratory effort, no increased work of breathing. GI: Abdomen is soft, non tender, non distended, no abdominal masses. Liver and spleen not palpable.  No hernias appreciated.  Stool sample for occult testing is not indicated.   GU: No CVA tenderness.  No bladder fullness or masses.  Patient with circumcised phallus.    Urethral meatus is patent.  No penile discharge. No penile lesions or rashes. Scrotum without lesions, cysts, rashes and/or edema.  Testicles are located scrotally bilaterally. No masses are appreciated in the testicles. Left and right epididymis are normal. Rectal: Patient with  normal sphincter tone. Anus and perineum without scarring or rashes. No rectal masses are appreciated. Prostate is approximately 45 grams, could only palpate the apex, no nodules are appreciated. Seminal vesicles are normal. Skin: No rashes, bruises or suspicious lesions. Lymph: No cervical or inguinal adenopathy. Neurologic:  Grossly intact, no focal deficits, moving all 4 extremities. Psychiatric: Normal mood and affect.   Laboratory Data: Lab Results  Component Value Date   WBC 9.7 03/15/2017   HGB 15.9 03/15/2017   HCT 44.9 03/15/2017   MCV 87.2 03/15/2017   PLT 190 03/15/2017    Lab Results  Component Value Date   CREATININE 0.64 03/15/2017    Lab Results  Component Value Date   PSA 0.61 09/30/2015    Lab Results  Component Value Date   AST 33 03/15/2017   Lab Results  Component Value Date   ALT 26 03/15/2017   I have reviewed the labs.   Pertinent Imaging: CLINICAL DATA:  One week history of right flank pain. Known kidney stones.  EXAM: ABDOMEN - 1 VIEW  COMPARISON:  CT scan of the abdomen and pelvis dated March 15, 2017  FINDINGS: There is an approximately 6 mm diameter stone projecting in the mid upper pole of the left kidney. There is a punctate stone projecting in the lower pole. On the right no definite kidney stones are observed. No ureteral stones are demonstrated. There is faint calcification in the right aspect of the true pelvis. This was not visible on a KUB of February 09, 2005. The bowel gas pattern is normal. The bony structures exhibit no acute abnormalities.  IMPRESSION: Left-sided kidney stones. No definite kidney stones or ureteral stones on the right. One cannot absolutely exclude a tiny distal right ureteral stone though this could reflect a phlebolith as well.   Electronically Signed   By: David  [place] M.D.   On: 03/06/2018 08:42  I have independently reviewed the films   Assessment & Plan:   1. Left nephrolithiasis Seen 03/06/2018 KUB No intervention at this time will monitor with yearly KUB's 02/2019 Advised to contact our office or seek treatment in the ED if becomes febrile or pain/ vomiting are difficult control in order to arrange for emergent/urgent intervention   2. Right hip pain Patient is currently engaged in an  effort to try to lose weight and to eat more healthy foods He will also stop his consumption of [facility] If pain persists with his dietary changes,  he will contact the office for another KUB Patient is advised that if they should start to experience pain that is not able to be controlled with pain medication, intractable nausea and/or vomiting and/or fevers greater than 103 or shaking chills to contact the office immediately or seek treatment in the emergency department for emergent intervention.    3. Left renal cyst Most likely benign   4. BPH with LUTS IPSS score is 6/4, it is slightly worsened Continue conservative management, avoiding bladder irritants and timed voiding's Most bothersome symptoms is/are frequency and nocturia Nocturia is likely due to the untreated sleep apnea Frequency may be due to the consumption of [facility] Patient is making dietary changes Continue finasteride 5 mg daily RTC in 6 months for I PSS, PSA and exam  5. Nocturia Could not tolerate CPAP machine, so this likely explains the worsening of the nocturia    6. ED Prescription given for sildenafil Sildenafil 20 mg, 3 to 5 tablets two hours prior to intercourse on an empty stomach, # 50; he is warned not to take medications that contain nitrates.  I also advised him of the side effects, such as: headache, flushing, dyspepsia, abnormal vision, nasal congestion, back pain, myalgia, nausea, dizziness, and rash. RTC in 6 months for SHIM      Return in about 6 months (around 09/05/2018) for IPSS, PSA and exam.  These notes generated with voice recognition software. I apologize for typographical errors.  Michiel Cowboy, PA-C  [facility] [address] [place], [place] [address] [phone]

## 2018-09-02 ENCOUNTER — Other Ambulatory Visit: Payer: Self-pay

## 2018-09-02 ENCOUNTER — Encounter: Payer: Self-pay | Admitting: Emergency Medicine

## 2018-09-02 ENCOUNTER — Emergency Department
Admission: EM | Admit: 2018-09-02 | Discharge: 2018-09-02 | Disposition: A | Discharge status: Home / Self Care | Payer: BLUE CROSS/BLUE SHIELD | Attending: Emergency Medicine | Admitting: Emergency Medicine

## 2018-09-02 DIAGNOSIS — N401 Enlarged prostate with lower urinary tract symptoms: Principal | ICD-10-CM

## 2018-09-02 DIAGNOSIS — R3129 Other microscopic hematuria: Secondary | ICD-10-CM | POA: Diagnosis not present

## 2018-09-02 DIAGNOSIS — Z79899 Other long term (current) drug therapy: Secondary | ICD-10-CM | POA: Insufficient documentation

## 2018-09-02 DIAGNOSIS — R1031 Right lower quadrant pain: Secondary | ICD-10-CM | POA: Diagnosis present

## 2018-09-02 DIAGNOSIS — Z87891 Personal history of nicotine dependence: Secondary | ICD-10-CM | POA: Diagnosis not present

## 2018-09-02 DIAGNOSIS — N138 Other obstructive and reflux uropathy: Secondary | ICD-10-CM

## 2018-09-02 DIAGNOSIS — I10 Essential (primary) hypertension: Secondary | ICD-10-CM | POA: Insufficient documentation

## 2018-09-02 LAB — CHLAMYDIA/NGC RT PCR (ARMC ONLY)
Chlamydia Tr: NOT DETECTED
N gonorrhoeae: NOT DETECTED

## 2018-09-02 LAB — URINALYSIS, COMPLETE (UACMP) WITH MICROSCOPIC
Bacteria, UA: NONE SEEN
Bilirubin Urine: NEGATIVE
GLUCOSE, UA: NEGATIVE mg/dL
Ketones, ur: NEGATIVE mg/dL
Leukocytes,Ua: NEGATIVE
Nitrite: NEGATIVE
Protein, ur: NEGATIVE mg/dL
Specific Gravity, Urine: 1.024 (ref 1.005–1.030)
Squamous Epithelial / LPF: NONE SEEN (ref 0–5)
pH: 5 (ref 5.0–8.0)

## 2018-09-02 NOTE — ED Provider Notes (Signed)
[facility] Emergency Department Provider Note   ____________________________________________   First MD Initiated Contact with Patient 09/02/18 [phone]     (approximate)  I have reviewed the triage vital signs and the nursing notes.   HISTORY  Chief Complaint Groin Pain   HPI Rickey Lane is a 53 y.o. male presents to the ED with complaint of waking up with right-sided groin pain at 3 AM.  Patient states that he had some burning with urination that lasted approximately 1 hour.  He is unaware of any gross hematuria.  He denies any flank pain, nausea or vomiting.  Patient does have a history of kidney stones.  He denies any fever, chills or urinary frequency.  At this time he has absolutely no pain and has had no pain for the last hour.  He denies any penile discharge.  Patient states he has an appointment with his urologist on Thursday.       Past Medical History:  Diagnosis Date  . Atrial fibrillation (HCC)    a. initially diagnosed in 09/2015. Reported a history of "irregular HR" for 3+ years  . Dysrhythmia   . GERD (gastroesophageal reflux disease)   . Headache   . Kidney stone   . Sleep apnea     Patient Active Problem List   Diagnosis Date Noted  . Essential hypertension 05/21/2017  . Erectile dysfunction 05/21/2017  . Benign prostatic hyperplasia with post-void dribbling 05/21/2017  . Left nephrolithiasis 05/21/2017  . Renal cyst, left 05/21/2017  . BMI 29.0-29.9,adult 05/21/2017  . Current every day smoker 05/21/2017  . Persistent atrial fibrillation     Past Surgical History:  Procedure Laterality Date  . ELECTROPHYSIOLOGIC STUDY N/A 11/04/2015   Procedure: CARDIOVERSION;  Surgeon: Iran Ouch, MD;  Location: [facility] ORS;  Service: Cardiovascular;  Laterality: N/A;  . lithotripsy      Prior to Admission medications   Medication Sig Start Date End Date Taking? Authorizing Provider  acetaminophen (TYLENOL) 500 MG tablet Take  500 mg by mouth every 6 (six) hours as needed.    [provider]  aspirin EC 325 MG tablet Take 325 mg by mouth every 6 (six) hours as needed.     [provider]  finasteride (PROSCAR) 5 MG tablet Take 1 tablet (5 mg total) by mouth daily. 08/28/17   Michiel Cowboy A, PA-C  Multiple Vitamin (MULTI-VITAMINS) TABS Take by mouth.    [provider]  ranitidine (ZANTAC) 150 MG tablet Take 150 mg by mouth daily as needed for heartburn.    [provider]  sildenafil (REVATIO) 20 MG tablet Take 3 to 5 tablets two hours before intercouse on an empty stomach.  Do not take with nitrates. 03/07/18   Michiel Cowboy A, PA-C    Allergies Nitroglycerin  Family History  Problem Relation Age of Onset  . Heart attack Father        Initial MI age 59, deceased in his 59's from a massive MI  . Heart attack Maternal Aunt 55  . Prostate cancer Maternal Grandfather   . Kidney cancer Neg Hx   . Bladder Cancer Neg Hx     Social History Social History   Tobacco Use  . Smoking status: Former Smoker    Packs/day: 1.00    Years: 20.00    Pack years: 20.00    Types: Cigarettes    Last attempt to quit: 01/24/2018    Years since quitting: 0.6  . Smokeless tobacco: Never  Used  Substance Use Topics  . Alcohol use: No    Frequency: Never  . Drug use: No    Review of Systems Constitutional: No fever/chills Eyes: No visual changes. ENT: No sore throat. Cardiovascular: Denies chest pain. Respiratory: Denies shortness of breath. Gastrointestinal: No abdominal pain.  No nausea, no vomiting.  Genitourinary: Positive for penile discomfort for approximately 1 hour that is resolved at this time. Musculoskeletal: Negative for back pain. Skin: Negative for rash. Neurological: Negative for headaches, focal weakness or numbness. ___________________________________________   PHYSICAL EXAM:  VITAL SIGNS: ED Triage Vitals [09/02/18 0113]  Enc Vitals Group     BP (!) 150/91      Pulse Rate 85     Resp 18     Temp 98 F (36.7 C)     Temp Source Oral     SpO2 97 %     Weight 209 lb (94.8 kg)     Height 5\' 10"  (1.778 m)     Head Circumference      Peak Flow      Pain Score      Pain Loc      Pain Edu?      Excl. in GC?    Constitutional: Alert and oriented. Well appearing and in no acute distress. Eyes: Conjunctivae are normal.  Head: Atraumatic. Neck: No stridor.   Cardiovascular: Normal rate, regular rhythm. Grossly normal heart sounds.  Good peripheral circulation. Respiratory: Normal respiratory effort.  No retractions. Lungs CTAB. Gastrointestinal: Soft and nontender. No distention.  No CVA tenderness. Musculoskeletal: No lower extremity tenderness nor edema.  No joint effusions. Neurologic:  Normal speech and language. No gross focal neurologic deficits are appreciated.  Skin:  Skin is warm, dry and intact. No rash noted. Psychiatric: Mood and affect are normal. Speech and behavior are normal.  ____________________________________________   LABS (all labs ordered are listed, but only abnormal results are displayed)  Labs Reviewed  URINALYSIS, COMPLETE (UACMP) WITH MICROSCOPIC - Abnormal; Notable for the following components:      Result Value   Color, Urine YELLOW (*)    APPearance CLEAR (*)    Hgb urine dipstick SMALL (*)    All other components within normal limits  CHLAMYDIA/NGC RT PCR ([facility] ONLY)    PROCEDURES  Procedure(s) performed (including Critical Care):  Procedures   ____________________________________________   INITIAL IMPRESSION / ASSESSMENT AND PLAN / ED COURSE  As part of my medical decision making, I reviewed the following data within the electronic MEDICAL RECORD NUMBER Notes from prior ED visits and [place] Controlled Substance Database        Clinical Course as of Sep 02 1454  Mon Sep 02, 2018  0451 RBC / HPF: 21-50 [RB]  0451 Hgb urine dipstick(!): SMALL [RB]  0451 Bacteria, UA: NONE SEEN [RB]    0451 WBC, UA: 0-5 [RB]    Clinical Course User Index [RB] Darci Current, MD   Patient presents to the ED with complaint of waking up at approximately 3 AM with dysuria and burning at the end of his penis.  Patient states that he had pain for approximately 1 hour and this was worse with urination.  He states that since being in the ED and currently now he has not had any pain.  Patient has a history of stones but has not had one in the last possibly 10 years.  Urinalysis was positive for RBCs.  Patient has an appointment with his urologist on Thursday.  Because  he has have no symptoms and has been symptom-free for 1 hour he will drink fluids and keep his appointment with the urologist.  He is encouraged to return to the emergency department for any worsening or changes in his symptoms or urgent concerns.  ____________________________________________   FINAL CLINICAL IMPRESSION(S) / ED DIAGNOSES  Final diagnoses:  Other microscopic hematuria     ED Discharge Orders    None       Note:  This document was prepared using Dragon voice recognition software and may include unintentional dictation errors.    Tommi Rumps, PA-C 09/02/18 1456    Emily Filbert, MD 09/02/18 [phone]

## 2018-09-02 NOTE — Discharge Instructions (Addendum)
Keep your appointment with your urologist on Thursday.

## 2018-09-02 NOTE — ED Notes (Signed)
See triage note  Presents with pain during urination  States pain started this am  Describes ar burning at tip of penis

## 2018-09-02 NOTE — ED Triage Notes (Signed)
Pt reports he woke this AM at 0000 with burning to the tip of penis, worse with urination. Pt sts he is see urology due to urination problems but this AM the pain was different.

## 2018-09-02 NOTE — ED Notes (Signed)
Pt sleeping soundly while waiting for treatment room

## 2018-09-03 ENCOUNTER — Other Ambulatory Visit: Payer: BLUE CROSS/BLUE SHIELD

## 2018-09-03 DIAGNOSIS — N138 Other obstructive and reflux uropathy: Secondary | ICD-10-CM

## 2018-09-03 DIAGNOSIS — N401 Enlarged prostate with lower urinary tract symptoms: Principal | ICD-10-CM

## 2018-09-03 NOTE — Progress Notes (Signed)
09/05/2018 8:20 AM   Rickey Lane May 25, 1966 [id number]  Referring provider: Margaretann Loveless, PA-C [address] [place], [place] [address]  Chief Complaint  Patient presents with  . Follow-up     IPSS, PSA and exam    HPI: Rickey Lane is a 53 y.o. male Caucasian with a left renal stone, renal cyst, BPH with LU TS, nocturia and ED who presents for a six month follow up.  Left renal stone A CT angio abd/pel w/wo was performed on 03/15/2017 noted several loops of proximal jejunum with mild wall thickening and mucosal enhancement compatible with acute enteritis. No associated obstruction, perforation, fluid collection or abscess. No free air.  Nonobstructing left nephrolithiasis.  Normal adrenal glands. No renal obstruction, hydronephrosis, hydroureter, or obstructing ureteral calculus.  Bladder unremarkable. Tiny incidental left cortical hypodense cyst.  Nonobstructing 4 mm calculus in the left kidney upper pole.    KUB taken on 03/05/2018 revealed left-sided kidney stones. No definite kidney stones or ureteral stones on the right. One cannot absolutely exclude a tiny distal right ureteral stone though this could reflect a phlebolith as well.   On 09/02/2018, the patient reported to the emergency department complaining of having awoken with right-sided groin pain at 3 AM.  He reported some burning with urination that lasted approximately 1 hour.  He denied any gross hematuria.  He denied any flank pain, nausea, or vomiting.  He denied an fever, chills, or urinary frequency.  He denied any penile discharge.  At the time he left the emergency department, he was in absolutely no pain.  21-50 RBCs were seen on microscopic examination.  Patient reports today (09/05/2018) that he woke up with pain at the tip of his phallus, but that has abated.  Patient denies any gross hematuria, dysuria or suprapubic/flank pain at this time.   Patient denies any fevers, chills,  nausea or vomiting at this time.  He has not passed a fragment.    Left renal cyst See above  BPH WITH LUTS  (prostate and/or bladder) IPSS score: 6/4  PVR: 0 mL  Previous score: 6/4  Major complaint(s): Frequency and nocturia.  Denies any dysuria, hematuria or suprapubic pain.   Currently taking: Finasteride 5 mg daily.  He had been on tamsulosin, but could not tolerate the retrograde ejaculation.  He had tried [place] as well, but found it ineffective for frequency.  His has had some symptom improvement since the incident early in 08/2018.  He reports today (09/05/2018) that he has reduced the amount of [facility] and taken up drinking juices such as grapefruit juice.  He reports that he spends about 2 minutes after urinating waiting for him to finish dribbling; this has happened for several years.  He reports that his stream was 'like a firehose' before 09/02/2018, but he feels the weaker stream is more normal.  Denies any recent fevers, chills, nausea or vomiting.  He has a family history of PCa, with maternal grandfather having had it.  IPSS    Row Name 09/05/18 1500         International Prostate Symptom Score   How often have you had the sensation of not emptying your bladder?  Not at All     How often have you had to urinate less than every two hours?  Less than 1 in 5 times     How often have you found you stopped and started again several times when you urinated?  Not  at All     How often have you found it difficult to postpone urination?  Less than half the time     How often have you had a weak urinary stream?  Not at All     How often have you had to strain to start urination?  Not at All     How many times did you typically get up at night to urinate?  3 Times     Total IPSS Score  6       Quality of Life due to urinary symptoms   If you were to spend the rest of your life with your urinary condition just the way it is now how would you feel about that?  Mostly  Disatisfied        Score:  1-7 Mild 8-19 Moderate 20-35 Severe  Nocturia He was unable to tolerate his CPAP machine.  He is getting up three nights weekly.     Erectile dysfunction Patient has noticed a decrease in his ejaculate fluid and loss of libido.    PMH: Past Medical History:  Diagnosis Date  . Atrial fibrillation (HCC)    a. initially diagnosed in 09/2015. Reported a history of "irregular HR" for 3+ years  . Dysrhythmia   . GERD (gastroesophageal reflux disease)   . Headache   . Kidney stone   . Sleep apnea     Surgical History: Past Surgical History:  Procedure Laterality Date  . ELECTROPHYSIOLOGIC STUDY N/A 11/04/2015   Procedure: CARDIOVERSION;  Surgeon: Iran Ouch, MD;  Location: [facility] ORS;  Service: Cardiovascular;  Laterality: N/A;  . lithotripsy      Home Medications:  Allergies as of 09/05/2018      Reactions   Nitroglycerin Other (See Comments)   "Shivering" and "decreased heart rate"      Medication List       Accurate as of September 05, 2018 11:59 PM. Always use your most recent med list.        acetaminophen 500 MG tablet Commonly known as:  TYLENOL Take 500 mg by mouth every 6 (six) hours as needed.   aspirin EC 325 MG tablet Take 325 mg by mouth every 6 (six) hours as needed.   finasteride 5 MG tablet Commonly known as:  Proscar Take 1 tablet (5 mg total) by mouth daily.   mirabegron ER 25 MG Tb24 tablet Commonly known as:  MYRBETRIQ Take 1 tablet (25 mg total) by mouth daily.   Multi-Vitamins Tabs Take by mouth.   Omega-3 1000 MG Caps Take by mouth.   ranitidine 150 MG tablet Commonly known as:  ZANTAC Take 150 mg by mouth daily as needed for heartburn.   sildenafil 20 MG tablet Commonly known as:  REVATIO Take 3 to 5 tablets two hours before intercouse on an empty stomach.  Do not take with nitrates.       Allergies:  Allergies  Allergen Reactions  . Nitroglycerin Other (See Comments)    "Shivering" and  "decreased heart rate"    Family History: Family History  Problem Relation Age of Onset  . Heart attack Father        Initial MI age 33, deceased in his 4's from a massive MI  . Heart attack Maternal Aunt 55  . Prostate cancer Maternal Grandfather   . Kidney cancer Neg Hx   . Bladder Cancer Neg Hx     Social History:  reports that he quit smoking about 7 months ago.  His smoking use included cigarettes. He has a 20.00 pack-year smoking history. He has never used smokeless tobacco. He reports that he does not drink alcohol or use drugs.  ROS: UROLOGY Frequent Urination?: Yes Hard to postpone urination?: Yes Burning/pain with urination?: No Get up at night to urinate?: Yes Leakage of urine?: No Urine stream starts and stops?: No Trouble starting stream?: No Do you have to strain to urinate?: No Blood in urine?: No Urinary tract infection?: No Sexually transmitted disease?: No Injury to kidneys or bladder?: No Painful intercourse?: No Weak stream?: No Erection problems?: No Penile pain?: No  Gastrointestinal Nausea?: No Vomiting?: No Indigestion/heartburn?: No Diarrhea?: No Constipation?: No  Constitutional Fever: No Night sweats?: No Weight loss?: No Fatigue?: No  Skin Skin rash/lesions?: No Itching?: No  Eyes Blurred vision?: No Double vision?: No  Ears/Nose/Throat Sore throat?: No Sinus problems?: No  Hematologic/Lymphatic Swollen glands?: No Easy bruising?: No  Cardiovascular Leg swelling?: No Chest pain?: No  Respiratory Cough?: No Shortness of breath?: No  Endocrine Excessive thirst?: No  Musculoskeletal Back pain?: No Joint pain?: No  Neurological Headaches?: No Dizziness?: No  Psychologic Depression?: No Anxiety?: No  Physical Exam: BP 125/79   Pulse 96   Ht 5\' 10"  (1.778 m)   Wt 205 lb 12.8 oz (93.4 kg)   SpO2 97%   BMI 29.53 kg/m   Constitutional:  Well nourished. Alert and oriented, No acute distress. HEENT: [place]  AT, moist mucus membranes.  Trachea midline. Cardiovascular: No clubbing, cyanosis, or edema. Respiratory: Normal respiratory effort, no increased work of breathing. GU: No CVA tenderness.  No bladder fullness or masses.  Patient with circumcised phallus.  Urethral meatus is patent.  No penile discharge. No penile lesions or rashes. Scrotum without lesions, cysts, rashes and/or edema.  Testicles are located scrotally bilaterally. No masses are appreciated in the testicles. Left and right epididymis are normal. Rectal: Patient with normal sphincter tone.  Anus and perineum without scarring or rashes.  No rectal masses are appreciated. Prostate is approximately 50 grams, apex and midportion could be palpated, no nodules are appreciated.  Seminal vesicles could not be palpated. Skin: No rashes, bruises or suspicious lesions. Lymph: No inguinal adenopathy. Neurologic: Grossly intact, no focal deficits, moving all 4 extremities. Psychiatric: Normal mood and affect.  Laboratory Data:  PSA Trend  09/30/2015, 0.61  04/11/2017, 0.9  05/14/2017, 1.1  03/05/2018, 0.25  09/03/2018, 0.3  Urinalysis KET trace, BLO trace-intact.  See Epic.  I have reviewed the labs.  Assessment & Plan:   1. Left nephrolithiasis - Seen 03/06/2018 KUB - KUB ordered to be performed prior to his visit in three weeks - Will monitor with yearly KUB's 02/2019 Patient is advised that if they should start to experience pain that is not able to be controlled with pain medication, intractable nausea and/or vomiting and/or fevers greater than 103 or shaking chills to contact the office immediately or seek treatment in the emergency department for emergent intervention.    2. BPH with LUTS - IPSS score is 6/4, it is stable - Continue conservative management, avoiding bladder irritants and timed voiding's - Most bothersome symptoms is/are post void dribbling and nocturia - Nocturia is likely due to the untreated sleep  apnea - given Myrbetriq 25 mg daily, # 28 samples - to see if they help with the post void dribbling - I have advised the patient of the side effects of Myrbetriq, such as: elevation in BP, urinary retention and/or HA. - RTC in  3 weeks for I PSS and PVR  3. Nocturia - Could not tolerate CPAP machine  4. ED - Discontinue the finasteride to see if ED improves - refill given for sildenafil Sildenafil 20 mg  - RTC in 6 months for SHIM    5. Microscopic hematuria - likely due to a passage of a stone - UA is clear today - will continue to monitor   Return in about 3 weeks (around 09/26/2018) for IPSS and PVR.  Michiel Cowboy, PA-C   [facility] [address] [place], [place] [address] [phone]  I, Duanne Moron, am acting as a Neurosurgeon for [employer], PA-C.   I have reviewed the above documentation for accuracy and completeness, and I agree with the above.    Michiel Cowboy, PA-C

## 2018-09-04 LAB — PSA: Prostate Specific Ag, Serum: 0.3 ng/mL (ref 0.0–4.0)

## 2018-09-05 ENCOUNTER — Ambulatory Visit (INDEPENDENT_AMBULATORY_CARE_PROVIDER_SITE_OTHER): Payer: BLUE CROSS/BLUE SHIELD | Admitting: Urology

## 2018-09-05 ENCOUNTER — Other Ambulatory Visit: Payer: Self-pay

## 2018-09-05 ENCOUNTER — Encounter: Payer: Self-pay | Admitting: Urology

## 2018-09-05 VITALS — BP 125/79 | HR 96 | Ht 70.0 in | Wt 205.8 lb

## 2018-09-05 DIAGNOSIS — N401 Enlarged prostate with lower urinary tract symptoms: Secondary | ICD-10-CM | POA: Diagnosis not present

## 2018-09-05 DIAGNOSIS — N529 Male erectile dysfunction, unspecified: Secondary | ICD-10-CM | POA: Diagnosis not present

## 2018-09-05 DIAGNOSIS — R351 Nocturia: Secondary | ICD-10-CM | POA: Diagnosis not present

## 2018-09-05 DIAGNOSIS — R3129 Other microscopic hematuria: Secondary | ICD-10-CM

## 2018-09-05 DIAGNOSIS — N200 Calculus of kidney: Secondary | ICD-10-CM

## 2018-09-05 DIAGNOSIS — N138 Other obstructive and reflux uropathy: Secondary | ICD-10-CM

## 2018-09-05 LAB — URINALYSIS, COMPLETE
Bilirubin, UA: NEGATIVE
GLUCOSE, UA: NEGATIVE
Leukocytes, UA: NEGATIVE
Nitrite, UA: NEGATIVE
Protein, UA: NEGATIVE
SPEC GRAV UA: 1.025 (ref 1.005–1.030)
Urobilinogen, Ur: 0.2 mg/dL (ref 0.2–1.0)
pH, UA: 5.5 (ref 5.0–7.5)

## 2018-09-05 LAB — MICROSCOPIC EXAMINATION
Bacteria, UA: NONE SEEN
Epithelial Cells (non renal): NONE SEEN /hpf (ref 0–10)

## 2018-09-05 LAB — BLADDER SCAN AMB NON-IMAGING: Scan Result: 0

## 2018-09-05 MED ORDER — SILDENAFIL CITRATE 20 MG PO TABS: ORAL_TABLET | ORAL | 3 refills | Status: DC

## 2018-09-05 MED ORDER — MIRABEGRON ER 25 MG PO TB24: 25.0000 mg | ORAL_TABLET | Freq: Every day | ORAL | 0 refills | Status: DC

## 2018-09-23 ENCOUNTER — Ambulatory Visit
Admission: RE | Admit: 2018-09-23 | Discharge: 2018-09-23 | Disposition: A | Discharge status: Home / Self Care | Payer: BLUE CROSS/BLUE SHIELD | Source: Ambulatory Visit | Attending: Urology | Admitting: Urology

## 2018-09-23 ENCOUNTER — Other Ambulatory Visit: Payer: Self-pay

## 2018-09-23 DIAGNOSIS — N200 Calculus of kidney: Secondary | ICD-10-CM | POA: Insufficient documentation

## 2018-09-25 ENCOUNTER — Telehealth: Payer: Self-pay | Admitting: Urology

## 2018-09-25 ENCOUNTER — Other Ambulatory Visit: Payer: Self-pay

## 2018-09-25 ENCOUNTER — Telehealth (INDEPENDENT_AMBULATORY_CARE_PROVIDER_SITE_OTHER): Payer: BLUE CROSS/BLUE SHIELD | Admitting: Urology

## 2018-09-25 DIAGNOSIS — N401 Enlarged prostate with lower urinary tract symptoms: Secondary | ICD-10-CM | POA: Diagnosis not present

## 2018-09-25 DIAGNOSIS — Z87448 Personal history of other diseases of urinary system: Secondary | ICD-10-CM

## 2018-09-25 DIAGNOSIS — N529 Male erectile dysfunction, unspecified: Secondary | ICD-10-CM

## 2018-09-25 DIAGNOSIS — N200 Calculus of kidney: Secondary | ICD-10-CM | POA: Diagnosis not present

## 2018-09-25 DIAGNOSIS — N138 Other obstructive and reflux uropathy: Secondary | ICD-10-CM

## 2018-09-25 DIAGNOSIS — N139 Obstructive and reflux uropathy, unspecified: Secondary | ICD-10-CM | POA: Diagnosis not present

## 2018-09-25 NOTE — Telephone Encounter (Signed)
Would you call Mr. Render and have him schedule a follow up in 6 months, with a PSA and KUB prior to his visit?  Orders for KUB and PSA in chart.

## 2018-09-25 NOTE — Progress Notes (Signed)
Virtual Visit via Telephone Note  I connected with Rickey Lane on 09/25/2018 at [phone] by telephone and verified that I am speaking with the correct person using two identifiers.  They are located at work.  I am located at my home.    This visit type was conducted due to national recommendations for restrictions regarding the COVID-19 Pandemic (e.g. social distancing).  This format is felt to be most appropriate for this patient at this time.  All issues noted in this document were discussed and addressed.  No physical exam was performed.   I discussed the limitations, risks, security and privacy concerns of performing an evaluation and management service by telephone and the availability of in person appointments. I also discussed with the patient that there may be a patient responsible charge related to this service. The patient expressed understanding and agreed to proceed.   History of Present Illness: Rickey Lane is a 53 year old Caucasian male with a history of left nephrolithiasis, BPH with lots presenting with post void dribbling and nocturia, erectile dysfunction and microscopic hematuria who is following up after given a trial of Myrbetriq for his urinary symptoms and discontinuing his finasteride to see if it helped with his ED.  Patient states that he found no difference when taking the Myrbetriq 25 mg daily with his post void dribbling or the nocturia.  He states the biggest improvement he seen with his urinary symptoms is when he does not eat late at night and decreases his fluid intake at night.  He still does continue to have the post void dribbling even with behavioral changes, but he does not find it severe enough to take tamsulosin (caused worsening ED) or undergo cystoscopy for further evaluation at this time.  He did not discontinue the finasteride as we discussed.  He stated that he read it help prevent prostate cancer and would like to stay on that medication.  He states that  he does not have to take the sildenafil every time to have satisfactory intercourse.  He states that sometimes he can have satisfactory intercourse without taking any medications.  When he does have to take the sildenafil he takes four 20 mg tablets.  He does not have pain or curvature with erections.  He states he is still not sleeping with his CPAP machine and we discussed seeking out a different type of mask that may be more tolerable so he can sleep with the machine.  We also discussed how moderate intensity workouts 4 times a week at 40-minute intervals would be beneficial for his erectile dysfunction.   Observations/Objective: CLINICAL DATA:  Flank pain  EXAM: ABDOMEN - 1 VIEW  COMPARISON:  03/05/2018  FINDINGS: Distended stomach.  8 x 6 mm calculus projects over upper pole of LEFT kidney.  Additional tiny calculi projecting over mid and inferior LEFT kidney.  No definite RIGHT-side urinary tract calcification.  Remaining bowel gas pattern normal.  No acute osseous findings.  IMPRESSION: LEFT renal calculi as above.   Electronically Signed   By: Ulyses Southward M.D.   On: 09/23/2018 17:19  I have independently reviewed the films and discussed the results with the patient and note the three stones in the let kidney   Assessment and Plan:  1.  Left nephrolithiasis 8 mm stone present in the upper pole of left kidney - increased in size since CT in 02/2017 Repeat KUB in 6 months - will discuss treatment at that time  2. BPH with LU TS Most  bothersome symptoms are post void dribbling and nocturia Did not continue tamsulosin due to sexual side effects Did not find improvement in his symptoms with Myrbetriq Continue finasteride 5 mg daily Advised patient to see if there is a CPAP mask he can tolerate so that he will sleep with his machine at night to help with nocturia RTC in 6 months for I PSS score, PVR and exam   3. ED Advised patient to sleep with CPAP  machine to help with erections Advised patient to increase his aerobic exercise RTC in 6 months for SHIM score and exam  4. History of hematuria 21-50 RBC's seen on UA in ED on 09/02/2018 0-2 RBC's on follow up UA on 09/05/2018 He does not report gross hematuria RTC in 6 months for UA     Follow Up Instructions:  Rickey Lane will follow-up in 6 months for I PSS score, PVR, PSA, SHIM, UA and exam   I discussed the assessment and treatment plan with the patient. The patient was provided an opportunity to ask questions and all were answered. The patient agreed with the plan and demonstrated an understanding of the instructions.   The patient was advised to call back or seek an in-person evaluation if the symptoms worsen or if the condition fails to improve as anticipated.  I provided 13 minutes of non-face-to-face time during this encounter.   Wilder Kurowski, PA-C

## 2019-03-27 ENCOUNTER — Other Ambulatory Visit: Payer: BLUE CROSS/BLUE SHIELD

## 2019-04-03 ENCOUNTER — Ambulatory Visit: Payer: BLUE CROSS/BLUE SHIELD | Admitting: Urology

## 2019-05-06 ENCOUNTER — Other Ambulatory Visit: Payer: BLUE CROSS/BLUE SHIELD

## 2019-05-13 ENCOUNTER — Ambulatory Visit: Payer: BLUE CROSS/BLUE SHIELD | Admitting: Urology

## 2019-05-28 ENCOUNTER — Other Ambulatory Visit: Payer: Self-pay

## 2019-06-04 ENCOUNTER — Ambulatory Visit: Payer: BLUE CROSS/BLUE SHIELD | Admitting: Urology

## 2019-07-17 ENCOUNTER — Other Ambulatory Visit: Payer: Self-pay

## 2019-07-17 ENCOUNTER — Ambulatory Visit
Admission: RE | Admit: 2019-07-17 | Discharge: 2019-07-17 | Disposition: A | Discharge status: Home / Self Care | Payer: PRIVATE HEALTH INSURANCE | Source: Ambulatory Visit | Attending: Urology | Admitting: Urology

## 2019-07-17 DIAGNOSIS — N200 Calculus of kidney: Secondary | ICD-10-CM | POA: Insufficient documentation

## 2019-07-17 DIAGNOSIS — N138 Other obstructive and reflux uropathy: Secondary | ICD-10-CM

## 2019-07-17 DIAGNOSIS — N401 Enlarged prostate with lower urinary tract symptoms: Secondary | ICD-10-CM

## 2019-07-18 LAB — PSA: Prostate Specific Ag, Serum: 0.7 ng/mL (ref 0.0–4.0)

## 2019-07-22 ENCOUNTER — Encounter: Payer: Self-pay | Admitting: Urology

## 2019-07-22 ENCOUNTER — Other Ambulatory Visit: Payer: Self-pay

## 2019-07-22 ENCOUNTER — Ambulatory Visit (INDEPENDENT_AMBULATORY_CARE_PROVIDER_SITE_OTHER): Payer: PRIVATE HEALTH INSURANCE | Admitting: Urology

## 2019-07-22 VITALS — BP 129/82 | HR 102 | Ht 70.0 in | Wt 209.3 lb

## 2019-07-22 DIAGNOSIS — N529 Male erectile dysfunction, unspecified: Secondary | ICD-10-CM

## 2019-07-22 DIAGNOSIS — R351 Nocturia: Secondary | ICD-10-CM | POA: Diagnosis not present

## 2019-07-22 DIAGNOSIS — R3129 Other microscopic hematuria: Secondary | ICD-10-CM | POA: Diagnosis not present

## 2019-07-22 DIAGNOSIS — N200 Calculus of kidney: Secondary | ICD-10-CM | POA: Diagnosis not present

## 2019-07-22 DIAGNOSIS — N138 Other obstructive and reflux uropathy: Secondary | ICD-10-CM

## 2019-07-22 DIAGNOSIS — N401 Enlarged prostate with lower urinary tract symptoms: Secondary | ICD-10-CM

## 2019-07-22 MED ORDER — SILDENAFIL CITRATE 100 MG PO TABS: 100.0000 mg | ORAL_TABLET | Freq: Every day | ORAL | 3 refills | Status: AC | PRN

## 2019-07-22 MED ORDER — FINASTERIDE 5 MG PO TABS: 5.0000 mg | ORAL_TABLET | Freq: Every day | ORAL | 3 refills | Status: DC

## 2019-07-22 NOTE — Progress Notes (Signed)
09/05/2018 1:10 PM   Rickey Lane April 01, 1966 [id number]  Referring provider: Margaretann Loveless, PA-C [address] [place],  [place] [address]  Chief Complaint  Patient presents with  . Nephrolithiasis    HPI: Rickey Lane is a 54 y.o. male with a left renal stone, renal cyst, BPH with LU TS, nocturia and ED who presents for a six month follow up.  Left renal stone A CT angio abd/pel w/wo was performed on 03/15/2017 nonobstructing 4 mm calculus in the left kidney upper pole.  KUB taken on 03/05/2018 revealed left-sided kidney stones. No definite kidney stones or ureteral stones on the right.  No report of flank pain.  No reports of gross hematuria.  UA with 3-10 RBC's.      Left renal cyst Tiny incidental left cortical hypodense cyst on CT in 02/2017  BPH WITH LUTS  (prostate and/or bladder) IPSS score: 7/4   PVR: 18 mL  Previous score: 6/4  Previous PVR: 0 mL   Major complaint(s): Nocturia.x 3-5 and post void dribbling.    Patient denies any modifying or aggravating factors.  Patient denies any gross hematuria, dysuria or suprapubic/flank pain.  Patient denies any fevers, chills, nausea or vomiting. Denies any dysuria, hematuria or suprapubic pain.   Currently taking: finasteride 5 mg daily  Could not tolerate tamsulosin due to retrograde ejaculation  Toviaz and Myrbetriq were ineffective   Maternal grandfather with prostate cancer  IPSS    Row Name 07/22/19 1400         International Prostate Symptom Score   How often have you had the sensation of not emptying your bladder?  Not at All     How often have you had to urinate less than every two hours?  Less than 1 in 5 times     How often have you found you stopped and started again several times when you urinated?  Not at All     How often have you found it difficult to postpone urination?  Less than half the time     How often have you had a weak urinary stream?  Not at All     How  often have you had to strain to start urination?  Not at All     How many times did you typically get up at night to urinate?  4 Times     Total IPSS Score  7       Quality of Life due to urinary symptoms   If you were to spend the rest of your life with your urinary condition just the way it is now how would you feel about that?  Mostly Disatisfied        Score:  1-7 Mild 8-19 Moderate 20-35 Severe  Nocturia He was unable to tolerate his CPAP machine.  He is getting up three nights weekly.     Erectile dysfunction SHIM score: 20     Main complaint: maintain an erection x several years Risk factors:  age, BPH, sleep apnea and history of smoking.     No painful erections or curvatures with his erections.    No longer having spontaneous erections.  Tried: sildenafil and found it effective   SHIM    Row Name 07/22/19 1447         SHIM: Over the last 6 months:   How do you rate your confidence that you could get and keep an erection?  Moderate  When you had erections with sexual stimulation, how often were your erections hard enough for penetration (entering your partner)?  Almost Always or Always     During sexual intercourse, how often were you able to maintain your erection after you had penetrated (entered) your partner?  Sometimes (about half the time)     During sexual intercourse, how difficult was it to maintain your erection to completion of intercourse?  Slightly Difficult     When you attempted sexual intercourse, how often was it satisfactory for you?  Almost Always or Always       SHIM Total Score   SHIM  20        Score: 1-7 Severe ED 8-11 Moderate ED 12-16 Mild-Moderate ED 17-21 Mild ED 22-25 No ED      PMH: Past Medical History:  Diagnosis Date  . Atrial fibrillation (HCC)    a. initially diagnosed in 09/2015. Reported a history of "irregular HR" for 3+ years  . Dysrhythmia   . GERD (gastroesophageal reflux disease)   . Headache   . Kidney  stone   . Sleep apnea     Surgical History: Past Surgical History:  Procedure Laterality Date  . ELECTROPHYSIOLOGIC STUDY N/A 11/04/2015   Procedure: CARDIOVERSION;  Surgeon: Iran Ouch, MD;  Location: [facility] ORS;  Service: Cardiovascular;  Laterality: N/A;  . lithotripsy      Home Medications:  Allergies as of 07/22/2019      Reactions   Nitroglycerin Other (See Comments)   "Shivering" and "decreased heart rate"      Medication List       Accurate as of July 22, 2019 11:59 PM. If you have any questions, ask your nurse or doctor.        acetaminophen 500 MG tablet Commonly known as: TYLENOL Take 500 mg by mouth every 6 (six) hours as needed.   aspirin EC 325 MG tablet Take 325 mg by mouth every 6 (six) hours as needed.   finasteride 5 MG tablet Commonly known as: Proscar Take 1 tablet (5 mg total) by mouth daily.   mirabegron ER 25 MG Tb24 tablet Commonly known as: MYRBETRIQ Take 1 tablet (25 mg total) by mouth daily.   Multi-Vitamins Tabs Take by mouth.   Omega-3 1000 MG Caps Take by mouth.   ranitidine 150 MG tablet Commonly known as: ZANTAC Take 150 mg by mouth daily as needed for heartburn.   sildenafil 100 MG tablet Commonly known as: VIAGRA Take 1 tablet (100 mg total) by mouth daily as needed for erectile dysfunction. Take two hours prior to intercourse on an empty stomach Started by: Michiel Cowboy, PA-C   sildenafil 20 MG tablet Commonly known as: REVATIO Take 3 to 5 tablets two hours before intercouse on an empty stomach.  Do not take with nitrates.       Allergies:  Allergies  Allergen Reactions  . Nitroglycerin Other (See Comments)    "Shivering" and "decreased heart rate"    Family History: Family History  Problem Relation Age of Onset  . Heart attack Father        Initial MI age 63, deceased in his 37's from a massive MI  . Heart attack Maternal Aunt 55  . Prostate cancer Maternal Grandfather   . Kidney cancer Neg Hx    . Bladder Cancer Neg Hx     Social History:  reports that he quit smoking about 17 months ago. His smoking use included cigarettes. He has a 20.00  pack-year smoking history. He has never used smokeless tobacco. He reports that he does not drink alcohol or use drugs.  ROS: UROLOGY Frequent Urination?: No Hard to postpone urination?: No Burning/pain with urination?: No Get up at night to urinate?: Yes Leakage of urine?: No Urine stream starts and stops?: No Trouble starting stream?: No Do you have to strain to urinate?: No Blood in urine?: No Urinary tract infection?: No Sexually transmitted disease?: No Injury to kidneys or bladder?: No Painful intercourse?: No Weak stream?: No Erection problems?: No Penile pain?: No  Gastrointestinal Nausea?: No Vomiting?: No Indigestion/heartburn?: Yes Diarrhea?: No Constipation?: No  Constitutional Fever: No Night sweats?: No Weight loss?: No Fatigue?: No  Skin Skin rash/lesions?: No Itching?: No  Eyes Blurred vision?: No Double vision?: No  Ears/Nose/Throat Sore throat?: No Sinus problems?: No  Hematologic/Lymphatic Swollen glands?: No Easy bruising?: No  Cardiovascular Leg swelling?: No Chest pain?: No  Respiratory Cough?: No Shortness of breath?: No  Endocrine Excessive thirst?: No  Musculoskeletal Back pain?: No Joint pain?: No  Neurological Headaches?: No Dizziness?: No  Psychologic Depression?: No Anxiety?: No  Physical Exam: BP 129/82   Pulse (!) 102   Ht 5\' 10"  (1.778 m)   Wt 209 lb 4.8 oz (94.9 kg)   BMI 30.03 kg/m   Constitutional:  Well nourished. Alert and oriented, No acute distress. HEENT: [place] AT, mask in place.  Trachea midline, no masses. Cardiovascular: No clubbing, cyanosis, or edema. Respiratory: Normal respiratory effort, no increased work of breathing. GI: Abdomen is soft, non tender, non distended, no abdominal masses. Liver and spleen not palpable.  No hernias  appreciated.  Stool sample for occult testing is not indicated.   GU: No CVA tenderness.  No bladder fullness or masses.  Patient with circumcised phallus.  Urethral meatus is patent.  No penile discharge. No penile lesions or rashes. Scrotum without lesions, cysts, rashes and/or edema.  Testicles are located scrotally bilaterally. No masses are appreciated in the testicles. Left and right epididymis are normal. Rectal: Patient with  normal sphincter tone. Anus and perineum without scarring or rashes. No rectal masses are appreciated. Prostate is approximately 50 grams, could only palpate the apex and the midportion of the gland, no nodules are appreciated. Seminal vesicles could not be palpated Skin: No rashes, bruises or suspicious lesions. Lymph: No inguinal adenopathy. Neurologic: Grossly intact, no focal deficits, moving all 4 extremities. Psychiatric: Normal mood and affect.  Laboratory Data: PSA Trend  09/30/2015, 0.61  Component     Latest Ref Rng & Units 04/11/2017 05/14/2017 09/03/2018 07/17/2019  Prostate Specific Ag, Serum     0.0 - 4.0 ng/mL 0.9 1.1 0.3 0.7   Urinalysis Component     Latest Ref Rng & Units 07/22/2019  Specific Gravity, UA     1.005 - 1.030 1.025  pH, UA     5.0 - 7.5 5.5  Color, UA     Yellow Yellow  Appearance Ur     Clear Clear  Leukocytes,UA     Negative Negative  Protein,UA     Negative/Trace Negative  Glucose, UA     Negative Negative  Ketones, UA     Negative Negative  RBC, UA     Negative Trace (A)  Bilirubin, UA     Negative Negative  Urobilinogen, Ur     0.2 - 1.0 mg/dL 0.2  Nitrite, UA     Negative Negative  Microscopic Examination      See below:   Component  Latest Ref Rng & Units 07/22/2019  WBC, UA     0 - 5 /hpf 0-5  RBC     0 - 2 /hpf 3-10 (A)  Epithelial Cells (non renal)     0 - 10 /hpf None seen  Casts     None seen /lpf Present (A)  Cast Type     N/A Hyaline casts  Bacteria, UA     None seen/Few None seen     I have reviewed the labs.  Pertinent Imaging Results for Rickey Lane, Rickey Lane (MRN [id number]) as of 07/22/2019 15:08  Ref. Range 07/22/2019 14:32  Scan Result Unknown 18  CLINICAL DATA:  Follow-up left kidney stones.  Left flank pain.  EXAM: ABDOMEN - 1 VIEW  COMPARISON:  09/23/2018.  FINDINGS: 6 mm calculus projects in the left kidney, unchanged from the prior study. No other evidence of an intrarenal stone.  Tiny density lies in the left lower pelvis with another small round density in the right lower pelvis, both stable consistent with phleboliths. No convincing ureteral stone.  Normal bowel gas pattern.  Soft tissues are otherwise unremarkable.  IMPRESSION: 1. No convincing ureteral stone. 2. Stable left intrarenal stone.   Electronically Signed   By: Lajean Manes M.D.   On: 07/17/2019 19:23 I have independently reviewed the films and appreciate the 6 mm left kidney stone.    Assessment & Plan:   1. Microscopic hematuria (intermediate risk) Patient meets the criteria for intermediate risk according to the new AUA guidelines for [facility] I explained to the patient that there are a number of causes that can be associated with blood in the urine, such as stones, BPH, UTI's, damage to the urinary tract and/or cancer. At this time, we will proceed with a renal ultrasound and cystoscopy I described how the cystoscopy is performed, typically in an office setting with a flexible cystoscope. We described the risks, benefits, and possible side effects, the most common of which is a minor amount of blood in the urine and/or burning which usually resolves in 24 to 48 hours The patient had the opportunity to ask questions which were answered. Based upon this discussion, the patient is willing to proceed. Therefore, I've ordered: a RUS and cystoscopy.  2. Left nephrolithiasis Stable on today's (07/22/2019) KUB Will continue to monitor Patient is advised that if they should  start to experience pain that is not able to be controlled with pain medication, intractable nausea and/or vomiting and/or fevers greater than 103 or shaking chills to contact the office immediately or seek treatment in the emergency department for emergent intervention.    3. BPH with LUTS IPSS score is 7/4, it is slightly worse Continue conservative management, avoiding bladder irritants and timed voiding's Most bothersome symptoms is/are post void dribbling and nocturia Nocturia is likely due to the untreated sleep apnea Continue the finasteride 5 mg daily Failed medication therapy for post void dribbling - Cystoscopy pending  4. Nocturia Could not tolerate CPAP machine Asked for a note from my to excuse him from wearing a face mask due to his sleep apnea, I refused  5. Erectile dysfunction SHIM score is 20 Sildenafil 100 mg effective - refill given today RTC in one year for SHIM    Return for RUS report and cystoscopy for [facility] .  Zara Council, PA-C   [facility] [address] [place], [place] [address] [phone]

## 2019-07-23 LAB — MICROSCOPIC EXAMINATION
Bacteria, UA: NONE SEEN
Epithelial Cells (non renal): NONE SEEN /hpf (ref 0–10)

## 2019-07-23 LAB — URINALYSIS, COMPLETE
Bilirubin, UA: NEGATIVE
Glucose, UA: NEGATIVE
Ketones, UA: NEGATIVE
Leukocytes,UA: NEGATIVE
Nitrite, UA: NEGATIVE
Protein,UA: NEGATIVE
Specific Gravity, UA: 1.025 (ref 1.005–1.030)
Urobilinogen, Ur: 0.2 mg/dL (ref 0.2–1.0)
pH, UA: 5.5 (ref 5.0–7.5)

## 2019-07-28 ENCOUNTER — Telehealth: Payer: Self-pay | Admitting: Radiology

## 2019-07-28 NOTE — Telephone Encounter (Signed)
Darl Pikes in radiology scheduling called this patient to schedule his RUS prior to his upcoming app and he declined to schedule. said he does not want it. Attempted to reach patient by phone to confirm cystoscopy appointment. No voicemail & unable to reach patient.

## 2019-08-13 ENCOUNTER — Other Ambulatory Visit: Payer: Self-pay | Admitting: Urology

## 2019-08-13 ENCOUNTER — Ambulatory Visit: Payer: PRIVATE HEALTH INSURANCE

## 2019-08-20 ENCOUNTER — Other Ambulatory Visit: Payer: Self-pay

## 2019-08-20 ENCOUNTER — Ambulatory Visit
Admission: RE | Admit: 2019-08-20 | Discharge: 2019-08-20 | Disposition: A | Discharge status: Home / Self Care | Payer: Self-pay | Source: Ambulatory Visit | Attending: Urology | Admitting: Urology

## 2019-08-20 DIAGNOSIS — R3129 Other microscopic hematuria: Secondary | ICD-10-CM | POA: Insufficient documentation

## 2019-08-25 ENCOUNTER — Telehealth: Payer: Self-pay | Admitting: Urology

## 2019-08-25 NOTE — Telephone Encounter (Signed)
Patient called and stated that he has his RUS but he does not want to have the cysto done and wants to know if there is any need for him to come in? Please advise and let your clinical or admin staff know so they can call the patient back to let him know.  Thanks, Marcelino Duster

## 2019-08-26 ENCOUNTER — Encounter: Payer: Self-pay | Admitting: Urology

## 2019-08-26 NOTE — Telephone Encounter (Signed)
Shawna Clamp never seen this patient  Legrand Rams, MD 08/26/2019

## 2019-08-26 NOTE — Progress Notes (Signed)
Certified letter sent 

## 2019-08-26 NOTE — Telephone Encounter (Signed)
Please explain to Mr. Rickey Lane that he needs to have the cystoscopy to look for bladder cancer.  The ultrasound of his kidney will not show if he has bladder cancer.

## 2019-08-26 NOTE — Telephone Encounter (Signed)
Spoke to patient and informed him, he states he is not going to do the Cysto at this time.

## 2019-09-03 ENCOUNTER — Other Ambulatory Visit: Payer: Self-pay | Admitting: Urology

## 2019-09-04 ENCOUNTER — Encounter: Payer: Self-pay | Admitting: Urology

## 2019-10-17 NOTE — Progress Notes (Deleted)
     Established patient visit   Patient: Rickey Lane   DOB: Dec 29, 1965   54 y.o. Male  MRN: [id number] Visit Date: 10/17/2019  Today's healthcare provider: Margaretann Loveless, PA-C   No chief complaint on file.  Subjective    HPI   {Show patient history (optional):23778::" "}   Medications: Outpatient Medications Prior to Visit  Medication Sig  . acetaminophen (TYLENOL) 500 MG tablet Take 500 mg by mouth every 6 (six) hours as needed.  Marland Kitchen aspirin EC 325 MG tablet Take 325 mg by mouth every 6 (six) hours as needed.   . finasteride (PROSCAR) 5 MG tablet Take 1 tablet (5 mg total) by mouth daily.  . mirabegron ER (MYRBETRIQ) 25 MG TB24 tablet Take 1 tablet (25 mg total) by mouth daily.  . Multiple Vitamin (MULTI-VITAMINS) TABS Take by mouth.  . Omega-3 1000 MG CAPS Take by mouth.  . ranitidine (ZANTAC) 150 MG tablet Take 150 mg by mouth daily as needed for heartburn.  . sildenafil (REVATIO) 20 MG tablet Take 3 to 5 tablets two hours before intercouse on an empty stomach.  Do not take with nitrates.  . sildenafil (VIAGRA) 100 MG tablet Take 1 tablet (100 mg total) by mouth daily as needed for erectile dysfunction. Take two hours prior to intercourse on an empty stomach   No facility-administered medications prior to visit.    Review of Systems  {Show previous labs (optional):23779::" "}   Objective    There were no vitals taken for this visit. {Show previous vital signs (optional):23777::" "}  Physical Exam  ***  No results found for any visits on 10/20/19.  Assessment & Plan    ***  No follow-ups on file.      {provider attestation***:1}   Reine Just  [facility] [phone] (phone) [phone] (fax)  [facility]

## 2019-10-20 ENCOUNTER — Ambulatory Visit: Payer: PRIVATE HEALTH INSURANCE | Admitting: Physician Assistant

## 2019-10-24 ENCOUNTER — Ambulatory Visit: Payer: PRIVATE HEALTH INSURANCE | Admitting: Physician Assistant

## 2019-11-20 ENCOUNTER — Encounter: Payer: Self-pay | Admitting: Physician Assistant

## 2019-11-20 ENCOUNTER — Ambulatory Visit (INDEPENDENT_AMBULATORY_CARE_PROVIDER_SITE_OTHER): Payer: PRIVATE HEALTH INSURANCE | Admitting: Physician Assistant

## 2019-11-20 ENCOUNTER — Other Ambulatory Visit: Payer: Self-pay

## 2019-11-20 VITALS — BP 137/102 | HR 90 | Temp 97.2°F | Resp 16 | Ht 70.0 in | Wt 215.2 lb

## 2019-11-20 DIAGNOSIS — I10 Essential (primary) hypertension: Secondary | ICD-10-CM

## 2019-11-20 DIAGNOSIS — L989 Disorder of the skin and subcutaneous tissue, unspecified: Secondary | ICD-10-CM

## 2019-11-20 DIAGNOSIS — E6609 Other obesity due to excess calories: Secondary | ICD-10-CM | POA: Diagnosis not present

## 2019-11-20 DIAGNOSIS — Z6830 Body mass index (BMI) 30.0-30.9, adult: Secondary | ICD-10-CM

## 2019-11-20 DIAGNOSIS — I4819 Other persistent atrial fibrillation: Secondary | ICD-10-CM

## 2019-11-20 DIAGNOSIS — M7989 Other specified soft tissue disorders: Secondary | ICD-10-CM

## 2019-11-20 DIAGNOSIS — B351 Tinea unguium: Secondary | ICD-10-CM

## 2019-11-20 NOTE — Progress Notes (Signed)
Established patient visit   Patient: Rickey Lane   DOB: 10-03-1965   54 y.o. Male  MRN: [id number] Visit Date: 11/20/2019  Today's healthcare provider: Margaretann Loveless, PA-C   Chief Complaint  Patient presents with  . Nail Problem  . Ankle Injury   Subjective    HPI Patient here to see if he can get a note for work to wear face shield instead of a mask. He has A-Fib and some sob and sleep apnea and is hard for him to breath with mask at work. He has not had a covid 19 vaccine.  He also has a spot on his right lower leg. It has been there for over 10 yrs. He just want to make sure is not cancer. Has not seen a Dermatologist.  He also has a bump in his ankle reports that he hit it on a table several years but now it has started to bother him. Reports that after he stand on his feet for a long time the right foot hurts and get swollen.  Toe nail fungus: Reports that he has them all over his nails. Has tried OTC medicines and prescribed Terbinafine and that didn't work.   Patient Active Problem List   Diagnosis Date Noted  . Essential hypertension 05/21/2017  . Erectile dysfunction 05/21/2017  . Benign prostatic hyperplasia with post-void dribbling 05/21/2017  . Left nephrolithiasis 05/21/2017  . Renal cyst, left 05/21/2017  . BMI 29.0-29.9,adult 05/21/2017  . Current every day smoker 05/21/2017  . Persistent atrial fibrillation [facility])    Past Medical History:  Diagnosis Date  . Atrial fibrillation (HCC)    a. initially diagnosed in 09/2015. Reported a history of "irregular HR" for 3+ years  . Dysrhythmia   . GERD (gastroesophageal reflux disease)   . Headache   . Kidney stone   . Sleep apnea        Medications: Outpatient Medications Prior to Visit  Medication Sig  . acetaminophen (TYLENOL) 500 MG tablet Take 500 mg by mouth every 6 (six) hours as needed.  Marland Kitchen aspirin EC 325 MG tablet Take 325 mg by mouth every 6 (six) hours as needed.   . Multiple  Vitamin (MULTI-VITAMINS) TABS Take by mouth.  . sildenafil (VIAGRA) 100 MG tablet Take 1 tablet (100 mg total) by mouth daily as needed for erectile dysfunction. Take two hours prior to intercourse on an empty stomach  . [DISCONTINUED] finasteride (PROSCAR) 5 MG tablet Take 1 tablet (5 mg total) by mouth daily.  . [DISCONTINUED] mirabegron ER (MYRBETRIQ) 25 MG TB24 tablet Take 1 tablet (25 mg total) by mouth daily.  . [DISCONTINUED] Omega-3 1000 MG CAPS Take by mouth.  . [DISCONTINUED] ranitidine (ZANTAC) 150 MG tablet Take 150 mg by mouth daily as needed for heartburn.  . [DISCONTINUED] sildenafil (REVATIO) 20 MG tablet Take 3 to 5 tablets two hours before intercouse on an empty stomach.  Do not take with nitrates.   No facility-administered medications prior to visit.    Review of Systems  Constitutional: Negative.   Respiratory: Negative.   Cardiovascular: Positive for leg swelling.  Gastrointestinal: Negative.   Skin: Positive for wound.       Nail issue  Neurological: Negative.     Last CBC Lab Results  Component Value Date   WBC 9.7 03/15/2017   HGB 15.9 03/15/2017   HCT 44.9 03/15/2017   MCV 87.2 03/15/2017   MCH 30.9 03/15/2017   RDW 13.0 03/15/2017  PLT 190 67/05/4579   Last metabolic panel Lab Results  Component Value Date   GLUCOSE 123 (H) 03/15/2017   NA 143 03/15/2017   K 2.6 (LL) 03/15/2017   CL 116 (H) 03/15/2017   CO2 21 (L) 03/15/2017   BUN 14 03/15/2017   CREATININE 0.64 03/15/2017   GFRNONAA >60 03/15/2017   GFRAA >60 03/15/2017   CALCIUM 6.7 (L) 03/15/2017   PROT 4.9 (L) 03/15/2017   ALBUMIN 2.8 (L) 03/15/2017   BILITOT 1.0 03/15/2017   ALKPHOS 51 03/15/2017   AST 33 03/15/2017   ALT 26 03/15/2017   ANIONGAP 6 03/15/2017      Objective    BP (!) 137/102 (BP Location: Left Arm, Patient Position: Sitting, Cuff Size: Large)   Pulse 90   Temp (!) 97.2 F (36.2 C) (Temporal)   Resp 16   Ht 5\' 10"  (1.778 m)   Wt 215 lb 3.2 oz (97.6 kg)    BMI 30.88 kg/m  BP Readings from Last 3 Encounters:  11/20/19 (!) 137/102  07/22/19 129/82  09/05/18 125/79   Wt Readings from Last 3 Encounters:  11/20/19 215 lb 3.2 oz (97.6 kg)  07/22/19 209 lb 4.8 oz (94.9 kg)  09/05/18 205 lb 12.8 oz (93.4 kg)      Physical Exam Vitals reviewed.  Constitutional:      General: He is not in acute distress.    Appearance: Normal appearance. He is well-developed. He is obese. He is not ill-appearing or diaphoretic.  HENT:     Head: Normocephalic and atraumatic.  Neck:     Thyroid: No thyromegaly.     Vascular: No JVD.     Trachea: No tracheal deviation.  Cardiovascular:     Rate and Rhythm: Normal rate. Rhythm irregularly irregular.     Heart sounds: Normal heart sounds. No murmur. No friction rub. No gallop.   Pulmonary:     Effort: Pulmonary effort is normal. No respiratory distress.     Breath sounds: Normal breath sounds. No wheezing or rales.  Musculoskeletal:     Cervical back: Normal range of motion and neck supple.     Right ankle: Swelling present. Tenderness present. Normal range of motion. Normal pulse.     Right Achilles Tendon: Normal.     Left ankle:     Left Achilles Tendon: Normal.     Right foot: Normal range of motion. No deformity.     Left foot: Normal range of motion. No deformity.       Legs:  Feet:     Right foot:     Skin integrity: Dry skin present. No ulcer, blister, skin breakdown, erythema or warmth.     Toenail Condition: Right toenails are abnormally thick. Fungal disease present.    Left foot:     Skin integrity: Dry skin present. No ulcer, blister, skin breakdown, erythema or warmth.     Toenail Condition: Left toenails are abnormally thick. Fungal disease present. Lymphadenopathy:     Cervical: No cervical adenopathy.  Skin:    General: Skin is warm.     Findings: Lesion present. No erythema.       Neurological:     Mental Status: He is alert.       No results found for any visits on  11/20/19.  Assessment & Plan     1. Essential hypertension Patient off medications. Patient declines any new start. Reports will f/u with Dr. Fletcher Anon, Cardiology.  2. Class 1 obesity due to excess  calories with serious comorbidity and body mass index (BMI) of 30.0 to 30.9 in adult Counseled patient on healthy lifestyle modifications including dieting and exercise.   3. Persistent atrial fibrillation (HCC) No medications. On ASA 325mg  for blood thinner only. Followed by Cardiology, Dr. .   4. Soft tissue mass Noted on right lower extremity just posterior to the medial malleolus. Causes pain occasionally and fluctuates with swelling. Will get imaging as below to r/o mass or SFTP.  - Kirke Corin SOFT TISSUE LOWER EXTREMITY LIMITED RIGHT (NON-VASCULAR); Future  5. Onychomycosis Noted on toe nails, right worse than left. Referral to podiatry as he has failed conservative management.  - Ambulatory referral to Podiatry  6. Skin Lesion Noted on right lower leg. Appears benign. Will monitor.    No follow-ups on file.      [place], PA-C, have reviewed all documentation for this visit. The documentation on 11/25/19 for the exam, diagnosis, procedures, and orders are all accurate and complete.   01/25/20  [facility] [phone] (phone) [phone] (fax)  [facility]

## 2019-11-20 NOTE — Patient Instructions (Signed)
DASH Eating Plan DASH stands for "Dietary Approaches to Stop Hypertension." The DASH eating plan is a healthy eating plan that has been shown to reduce high blood pressure (hypertension). It may also reduce your risk for type 2 diabetes, heart disease, and stroke. The DASH eating plan may also help with weight loss. What are tips for following this plan?  General guidelines  Avoid eating more than 2,300 mg (milligrams) of salt (sodium) a day. If you have hypertension, you may need to reduce your sodium intake to 1,500 mg a day.  Limit alcohol intake to no more than 1 drink a day for nonpregnant women and 2 drinks a day for men. One drink equals 12 oz of beer, 5 oz of wine, or 1 oz of hard liquor.  Work with your health care provider to maintain a healthy body weight or to lose weight. Ask what an ideal weight is for you.  Get at least 30 minutes of exercise that causes your heart to beat faster (aerobic exercise) most days of the week. Activities may include walking, swimming, or biking.  Work with your health care provider or diet and nutrition specialist (dietitian) to adjust your eating plan to your individual calorie needs. Reading food labels   Check food labels for the amount of sodium per serving. Choose foods with less than 5 percent of the Daily Value of sodium. Generally, foods with less than 300 mg of sodium per serving fit into this eating plan.  To find whole grains, look for the word "whole" as the first word in the ingredient list. Shopping  Buy products labeled as "low-sodium" or "no salt added."  Buy fresh foods. Avoid canned foods and premade or frozen meals. Cooking  Avoid adding salt when cooking. Use salt-free seasonings or herbs instead of table salt or sea salt. Check with your health care provider or pharmacist before using salt substitutes.  Do not fry foods. Cook foods using healthy methods such as baking, boiling, grilling, and broiling instead.  Cook with  heart-healthy oils, such as olive, canola, soybean, or sunflower oil. Meal planning  Eat a balanced diet that includes: ? 5 or more servings of fruits and vegetables each day. At each meal, try to fill half of your plate with fruits and vegetables. ? Up to 6-8 servings of whole grains each day. ? Less than 6 oz of lean meat, poultry, or fish each day. A 3-oz serving of meat is about the same size as a deck of cards. One egg equals 1 oz. ? 2 servings of low-fat dairy each day. ? A serving of nuts, seeds, or beans 5 times each week. ? Heart-healthy fats. Healthy fats called Omega-3 fatty acids are found in foods such as flaxseeds and coldwater fish, like sardines, salmon, and mackerel.  Limit how much you eat of the following: ? Canned or prepackaged foods. ? Food that is high in trans fat, such as fried foods. ? Food that is high in saturated fat, such as fatty meat. ? Sweets, desserts, sugary drinks, and other foods with added sugar. ? Full-fat dairy products.  Do not salt foods before eating.  Try to eat at least 2 vegetarian meals each week.  Eat more home-cooked food and less restaurant, buffet, and fast food.  When eating at a restaurant, ask that your food be prepared with less salt or no salt, if possible. What foods are recommended? The items listed may not be a complete list. Talk with your dietitian about   what dietary choices are best for you. Grains Whole-grain or whole-wheat bread. Whole-grain or whole-wheat pasta. Heckert rice. Oatmeal. Quinoa. Bulgur. Whole-grain and low-sodium cereals. Pita bread. Low-fat, low-sodium crackers. Whole-wheat flour tortillas. Vegetables Fresh or frozen vegetables (raw, steamed, roasted, or grilled). Low-sodium or reduced-sodium tomato and vegetable juice. Low-sodium or reduced-sodium tomato sauce and tomato paste. Low-sodium or reduced-sodium canned vegetables. Fruits All fresh, dried, or frozen fruit. Canned fruit in natural juice (without  added sugar). Meat and other protein foods Skinless chicken or turkey. Ground chicken or turkey. Pork with fat trimmed off. Fish and seafood. Egg whites. Dried beans, peas, or lentils. Unsalted nuts, nut butters, and seeds. Unsalted canned beans. Lean cuts of beef with fat trimmed off. Low-sodium, lean deli meat. Dairy Low-fat (1%) or fat-free (skim) milk. Fat-free, low-fat, or reduced-fat cheeses. Nonfat, low-sodium ricotta or cottage cheese. Low-fat or nonfat yogurt. Low-fat, low-sodium cheese. Fats and oils Soft margarine without trans fats. Vegetable oil. Low-fat, reduced-fat, or light mayonnaise and salad dressings (reduced-sodium). Canola, safflower, olive, soybean, and sunflower oils. Avocado. Seasoning and other foods Herbs. Spices. Seasoning mixes without salt. Unsalted popcorn and pretzels. Fat-free sweets. What foods are not recommended? The items listed may not be a complete list. Talk with your dietitian about what dietary choices are best for you. Grains Baked goods made with fat, such as croissants, muffins, or some breads. Dry pasta or rice meal packs. Vegetables Creamed or fried vegetables. Vegetables in a cheese sauce. Regular canned vegetables (not low-sodium or reduced-sodium). Regular canned tomato sauce and paste (not low-sodium or reduced-sodium). Regular tomato and vegetable juice (not low-sodium or reduced-sodium). Pickles. Olives. Fruits Canned fruit in a light or heavy syrup. Fried fruit. Fruit in cream or butter sauce. Meat and other protein foods Fatty cuts of meat. Ribs. Fried meat. Bacon. Sausage. Bologna and other processed lunch meats. Salami. Fatback. Hotdogs. Bratwurst. Salted nuts and seeds. Canned beans with added salt. Canned or smoked fish. Whole eggs or egg yolks. Chicken or turkey with skin. Dairy Whole or 2% milk, cream, and half-and-half. Whole or full-fat cream cheese. Whole-fat or sweetened yogurt. Full-fat cheese. Nondairy creamers. Whipped toppings.  Processed cheese and cheese spreads. Fats and oils Butter. Stick margarine. Lard. Shortening. Ghee. Bacon fat. Tropical oils, such as coconut, palm kernel, or palm oil. Seasoning and other foods Salted popcorn and pretzels. Onion salt, garlic salt, seasoned salt, table salt, and sea salt. Worcestershire sauce. Tartar sauce. Barbecue sauce. Teriyaki sauce. Soy sauce, including reduced-sodium. Steak sauce. Canned and packaged gravies. Fish sauce. Oyster sauce. Cocktail sauce. Horseradish that you find on the shelf. Ketchup. Mustard. Meat flavorings and tenderizers. Bouillon cubes. Hot sauce and Tabasco sauce. Premade or packaged marinades. Premade or packaged taco seasonings. Relishes. Regular salad dressings. Where to find more information:  National Heart, Lung, and Blood Institute: www.nhlbi.nih.gov  American Heart Association: www.heart.org Summary  The DASH eating plan is a healthy eating plan that has been shown to reduce high blood pressure (hypertension). It may also reduce your risk for type 2 diabetes, heart disease, and stroke.  With the DASH eating plan, you should limit salt (sodium) intake to 2,300 mg a day. If you have hypertension, you may need to reduce your sodium intake to 1,500 mg a day.  When on the DASH eating plan, aim to eat more fresh fruits and vegetables, whole grains, lean proteins, low-fat dairy, and heart-healthy fats.  Work with your health care provider or diet and nutrition specialist (dietitian) to adjust your eating plan to your   individual calorie needs. This information is not intended to replace advice given to you by your health care provider. Make sure you discuss any questions you have with your health care provider. Document Revised: 05/25/2017 Document Reviewed: 06/05/2016 Elsevier Patient Education  2020 Elsevier Inc.  

## 2019-11-25 ENCOUNTER — Encounter: Payer: Self-pay | Admitting: Physician Assistant

## 2019-11-26 ENCOUNTER — Encounter: Payer: PRIVATE HEALTH INSURANCE | Admitting: Physician Assistant

## 2019-12-04 ENCOUNTER — Ambulatory Visit: Payer: PRIVATE HEALTH INSURANCE

## 2020-02-20 ENCOUNTER — Ambulatory Visit: Payer: Self-pay | Admitting: Physician Assistant

## 2020-04-05 ENCOUNTER — Other Ambulatory Visit: Payer: Self-pay

## 2020-04-05 ENCOUNTER — Encounter: Payer: Self-pay | Admitting: Emergency Medicine

## 2020-04-05 ENCOUNTER — Emergency Department
Admission: EM | Admit: 2020-04-05 | Discharge: 2020-04-05 | Disposition: A | Discharge status: Home / Self Care | Payer: PRIVATE HEALTH INSURANCE | Attending: Emergency Medicine | Admitting: Emergency Medicine

## 2020-04-05 ENCOUNTER — Emergency Department: Payer: PRIVATE HEALTH INSURANCE

## 2020-04-05 DIAGNOSIS — W172XXA Fall into hole, initial encounter: Secondary | ICD-10-CM | POA: Insufficient documentation

## 2020-04-05 DIAGNOSIS — I10 Essential (primary) hypertension: Secondary | ICD-10-CM | POA: Insufficient documentation

## 2020-04-05 DIAGNOSIS — Z7982 Long term (current) use of aspirin: Secondary | ICD-10-CM | POA: Insufficient documentation

## 2020-04-05 DIAGNOSIS — S8002XA Contusion of left knee, initial encounter: Secondary | ICD-10-CM | POA: Insufficient documentation

## 2020-04-05 DIAGNOSIS — Z87891 Personal history of nicotine dependence: Secondary | ICD-10-CM | POA: Insufficient documentation

## 2020-04-05 MED ORDER — TRAMADOL HCL 50 MG PO TABS: 50.0000 mg | ORAL_TABLET | Freq: Four times a day (QID) | ORAL | 0 refills | Status: DC | PRN

## 2020-04-05 MED ORDER — NAPROXEN 500 MG PO TABS: 500.0000 mg | ORAL_TABLET | Freq: Two times a day (BID) | ORAL | 0 refills | Status: DC

## 2020-04-05 NOTE — Discharge Instructions (Signed)
Follow-up with your primary care provider if any continued problems or concerns.  Ice and elevation for the next 2 days.  Wear knee immobilizer anytime you are up walking.  You do not have to wear this to bed.  This is for added support and protection.  If your knee is not improving you will need to see the orthopedist who is Dr. Odis Luster.  His contact information is listed on your discharge papers.  A prescription for naproxen and tramadol was sent to your pharmacy.

## 2020-04-05 NOTE — ED Triage Notes (Signed)
Patient states that he fell on his left knee yesterday. Patient states that he continues to have pain to his knee.

## 2020-04-05 NOTE — ED Provider Notes (Signed)
[facility] Emergency Department Provider Note  ____________________________________________   First MD Initiated Contact with Patient 04/05/20 0715     (approximate)  I have reviewed the triage vital signs and the nursing notes.   HISTORY  Chief Complaint Knee Pain   HPI Rickey Lane is a 54 y.o. male presents to the ED with complaint of left knee pain after he fell.  Patient states that he fell into a "rut" causing him to fall yesterday.  Patient states that he has been taking over-the-counter ibuprofen but continues to have pain.  He rates pain as 9 out of 10.     Past Medical History:  Diagnosis Date  . Atrial fibrillation (HCC)    a. initially diagnosed in 09/2015. Reported a history of "irregular HR" for 3+ years  . Dysrhythmia   . GERD (gastroesophageal reflux disease)   . Headache   . Kidney stone   . Sleep apnea     Patient Active Problem List   Diagnosis Date Noted  . Essential hypertension 05/21/2017  . Erectile dysfunction 05/21/2017  . Benign prostatic hyperplasia with post-void dribbling 05/21/2017  . Left nephrolithiasis 05/21/2017  . Renal cyst, left 05/21/2017  . BMI 29.0-29.9,adult 05/21/2017  . Current every day smoker 05/21/2017  . Persistent atrial fibrillation [facility])     Past Surgical History:  Procedure Laterality Date  . ELECTROPHYSIOLOGIC STUDY N/A 11/04/2015   Procedure: CARDIOVERSION;  Surgeon: Iran Ouch, MD;  Location: [facility] ORS;  Service: Cardiovascular;  Laterality: N/A;  . lithotripsy      Prior to Admission medications   Medication Sig Start Date End Date Taking? Authorizing Provider  acetaminophen (TYLENOL) 500 MG tablet Take 500 mg by mouth every 6 (six) hours as needed.    [provider]  aspirin EC 325 MG tablet Take 325 mg by mouth every 6 (six) hours as needed.     [provider]  Multiple Vitamin (MULTI-VITAMINS) TABS Take by mouth.    [provider]    naproxen (NAPROSYN) 500 MG tablet Take 1 tablet (500 mg total) by mouth 2 (two) times daily with a meal. 04/05/20   Bridget Hartshorn L, PA-C  sildenafil (VIAGRA) 100 MG tablet Take 1 tablet (100 mg total) by mouth daily as needed for erectile dysfunction. Take two hours prior to intercourse on an empty stomach 07/22/19   McGowan, Carollee Herter A, PA-C  traMADol (ULTRAM) 50 MG tablet Take 1 tablet (50 mg total) by mouth every 6 (six) hours as needed. 04/05/20   Tommi Rumps, PA-C    Allergies Nitroglycerin  Family History  Problem Relation Age of Onset  . Heart attack Father        Initial MI age 17, deceased in his 63's from a massive MI  . Heart attack Maternal Aunt 55  . Prostate cancer Maternal Grandfather   . Kidney cancer Neg Hx   . Bladder Cancer Neg Hx     Social History Social History   Tobacco Use  . Smoking status: Former Smoker    Packs/day: 1.00    Years: 20.00    Pack years: 20.00    Types: Cigarettes    Quit date: 01/24/2018    Years since quitting: 2.1  . Smokeless tobacco: Never Used  Vaping Use  . Vaping Use: Never used  Substance Use Topics  . Alcohol use: No  . Drug use: No    Review of Systems Constitutional: No fever/chills Eyes: No visual changes. Cardiovascular:  Denies chest pain. Respiratory: Denies shortness of breath. Gastrointestinal: No abdominal pain.  No nausea, no vomiting.  Musculoskeletal: Positive for left knee Skin: Negative for rash. Neurological: Negative for headaches, focal weakness or numbness. ____________________________________________   PHYSICAL EXAM:  VITAL SIGNS: ED Triage Vitals [04/05/20 0514]  Enc Vitals Group     BP (!) 136/94     Pulse Rate 89     Resp 18     Temp 97.9 F (36.6 C)     Temp Source Oral     SpO2 100 %     Weight 225 lb (102.1 kg)     Height 5\' 10"  (1.778 m)     Head Circumference      Peak Flow      Pain Score 9     Pain Loc      Pain Edu?      Excl. in GC?     Constitutional: Alert  and oriented. Well appearing and in no acute distress. Eyes: Conjunctivae are normal.  Head: Atraumatic. Neck: No stridor.   Cardiovascular: Normal rate, regular rhythm. Grossly normal heart sounds.  Good peripheral circulation. Respiratory: Normal respiratory effort.  No retractions. Lungs CTAB. Gastrointestinal: Soft and nontender. No distention. Musculoskeletal: Left knee exam there is no gross deformity.  There is no appreciated effusion noted anteriorly.  Ligaments are stable bilaterally.  Skin is intact and without discoloration.  There is some minimal crepitus with range of motion but patient is able to flex and extend.  Patient is able to bear weight without assistance.  Neurologic:  Normal speech and language. No gross focal neurologic deficits are appreciated. No gait instability. Skin:  Skin is warm, dry and intact. No rash noted. Psychiatric: Mood and affect are normal. Speech and behavior are normal.  ____________________________________________   LABS (all labs ordered are listed, but only abnormal results are displayed)  Labs Reviewed - No data to display  RADIOLOGY I, , personally viewed and evaluated these images (plain radiographs) as part of my medical decision making, as well as reviewing the written report by the radiologist.   Official radiology report(s): DG Knee Complete 4 Views Left  Result Date: 04/05/2020 CLINICAL DATA:  Left knee pain after a fall yesterday. EXAM: LEFT KNEE - COMPLETE 4+ VIEW COMPARISON:  None. FINDINGS: No acute fracture or dislocation is identified. There is a small knee joint effusion. There is mild medial compartment joint space narrowing. IMPRESSION: 1. No acute osseous abnormality identified. 2. Small knee joint effusion. Electronically Signed   By: 06/05/2020 M.D.   On: 04/05/2020 06:15    ____________________________________________   PROCEDURES  Procedure(s) performed (including Critical  Care):  Procedures Knee immobilizer was applied by nursing staff.  ____________________________________________   INITIAL IMPRESSION / ASSESSMENT AND PLAN / ED COURSE  As part of my medical decision making, I reviewed the following data within the electronic MEDICAL RECORD NUMBER Notes from prior ED visits and [place] Controlled Substance Database  54 year old male presents to the ED with complaint of left knee pain since yesterday.  Patient states that he fell due to uneven ground.  He fell directly on his left knee.  He has continued to ambulate but complains of increased pain.  X-rays were negative for acute bony injury and ligaments are stable.  There is small effusion and patient was made aware.  He was placed in a knee immobilizer with instructions to ice and elevate.  A prescription for naproxen 500 mg twice daily with  food and tramadol 50 mg every 6 hours as needed for pain was sent to his pharmacy.  He is to follow-up with his PCP or Dr. Odis Luster who is on-call for orthopedics.  ____________________________________________   FINAL CLINICAL IMPRESSION(S) / ED DIAGNOSES  Final diagnoses:  Contusion of left knee, initial encounter     ED Discharge Orders         Ordered    naproxen (NAPROSYN) 500 MG tablet  2 times daily with meals        04/05/20 0732    traMADol (ULTRAM) 50 MG tablet  Every 6 hours PRN        04/05/20 0732          *Please note:  Rickey Lane was evaluated in Emergency Department on 04/05/2020 for the symptoms described in the history of present illness. He was evaluated in the context of the global COVID-19 pandemic, which necessitated consideration that the patient might be at risk for infection with the SARS-CoV-2 virus that causes COVID-19. Institutional protocols and algorithms that pertain to the evaluation of patients at risk for COVID-19 are in a state of rapid change based on information released by regulatory bodies including the CDC and federal and  state organizations. These policies and algorithms were followed during the patient's care in the ED.  Some ED evaluations and interventions may be delayed as a result of limited staffing during and the pandemic.*   Note:  This document was prepared using Dragon voice recognition software and may include unintentional dictation errors.    Tommi Rumps, PA-C 04/05/20 1342    Gilles Chiquito, MD 04/05/20 [phone]

## 2020-04-05 NOTE — ED Notes (Signed)
Patient transported to X-ray 

## 2020-05-07 ENCOUNTER — Other Ambulatory Visit: Payer: Self-pay

## 2020-05-07 ENCOUNTER — Emergency Department: Payer: PRIVATE HEALTH INSURANCE

## 2020-05-07 DIAGNOSIS — X58XXXA Exposure to other specified factors, initial encounter: Secondary | ICD-10-CM | POA: Insufficient documentation

## 2020-05-07 DIAGNOSIS — J40 Bronchitis, not specified as acute or chronic: Secondary | ICD-10-CM | POA: Insufficient documentation

## 2020-05-07 DIAGNOSIS — S39011A Strain of muscle, fascia and tendon of abdomen, initial encounter: Secondary | ICD-10-CM | POA: Insufficient documentation

## 2020-05-07 DIAGNOSIS — Z87891 Personal history of nicotine dependence: Secondary | ICD-10-CM | POA: Insufficient documentation

## 2020-05-07 DIAGNOSIS — I4811 Longstanding persistent atrial fibrillation: Secondary | ICD-10-CM | POA: Insufficient documentation

## 2020-05-07 DIAGNOSIS — K219 Gastro-esophageal reflux disease without esophagitis: Secondary | ICD-10-CM | POA: Insufficient documentation

## 2020-05-07 DIAGNOSIS — Z79899 Other long term (current) drug therapy: Secondary | ICD-10-CM | POA: Insufficient documentation

## 2020-05-07 DIAGNOSIS — Z20822 Contact with and (suspected) exposure to covid-19: Secondary | ICD-10-CM | POA: Insufficient documentation

## 2020-05-07 DIAGNOSIS — Z7982 Long term (current) use of aspirin: Secondary | ICD-10-CM | POA: Insufficient documentation

## 2020-05-07 LAB — BASIC METABOLIC PANEL
Anion gap: 11 (ref 5–15)
BUN: 26 mg/dL — ABNORMAL HIGH (ref 6–20)
CO2: 20 mmol/L — ABNORMAL LOW (ref 22–32)
Calcium: 9.2 mg/dL (ref 8.9–10.3)
Chloride: 107 mmol/L (ref 98–111)
Creatinine, Ser: 0.91 mg/dL (ref 0.61–1.24)
GFR, Estimated: >60 mL/min (ref >60)
Glucose, Bld: 133 mg/dL — ABNORMAL HIGH (ref 70–99)
Potassium: 3.9 mmol/L (ref 3.5–5.1)
Sodium: 138 mmol/L (ref 135–145)

## 2020-05-07 LAB — CBC
HCT: 46.2 % (ref 39.0–52.0)
Hemoglobin: 15.6 g/dL (ref 13.0–17.0)
MCH: 30.9 pg (ref 26.0–34.0)
MCHC: 33.8 g/dL (ref 30.0–36.0)
MCV: 91.5 fL (ref 80.0–100.0)
Platelets: 214 10*3/uL (ref 150–400)
RBC: 5.05 MIL/uL (ref 4.22–5.81)
RDW: 12.2 % (ref 11.5–15.5)
WBC: 10.7 10*3/uL — ABNORMAL HIGH (ref 4.0–10.5)
nRBC: 0 % (ref 0.0–0.2)

## 2020-05-07 LAB — URINALYSIS, COMPLETE (UACMP) WITH MICROSCOPIC
Bacteria, UA: NONE SEEN
Bilirubin Urine: NEGATIVE
Glucose, UA: 150 mg/dL — AB
Hgb urine dipstick: NEGATIVE
Ketones, ur: NEGATIVE mg/dL
Leukocytes,Ua: NEGATIVE
Nitrite: NEGATIVE
Protein, ur: NEGATIVE mg/dL
Specific Gravity, Urine: 1.03 (ref 1.005–1.030)
Squamous Epithelial / HPF: NONE SEEN (ref 0–5)
pH: 5 (ref 5.0–8.0)

## 2020-05-07 NOTE — ED Triage Notes (Signed)
PT to ED via POV c/o shortness of breath, sore throat, "feeling like throat is closing" and groin pain since Wednesday. PT ambulatory, speaking in full sentences and managing secretions.

## 2020-05-07 NOTE — Progress Notes (Deleted)
    Virtual telephone visit    Virtual Visit via Telephone Note   This visit type was conducted due to national recommendations for restrictions regarding the COVID-19 Pandemic (e.g. social distancing) in an effort to limit this patient's exposure and mitigate transmission in our community. Due to his co-morbid illnesses, this patient is at least at moderate risk for complications without adequate follow up. This format is felt to be most appropriate for this patient at this time. The patient did not have access to video technology or had technical difficulties with video requiring transitioning to audio format only (telephone). Physical exam was limited to content and character of the telephone converstion.    Patient location: *** Provider location: ***  I discussed the limitations of evaluation and management by telemedicine and the availability of in person appointments. The patient expressed understanding and agreed to proceed.   Visit Date: 05/10/2020  Today's healthcare provider: Margaretann Loveless, PA-C   No chief complaint on file.  Subjective    HPI  ***    {Show patient history (optional):23778::" "}  Medications: Outpatient Medications Prior to Visit  Medication Sig  . acetaminophen (TYLENOL) 500 MG tablet Take 500 mg by mouth every 6 (six) hours as needed.  Marland Kitchen aspirin EC 325 MG tablet Take 325 mg by mouth every 6 (six) hours as needed.   . Multiple Vitamin (MULTI-VITAMINS) TABS Take by mouth.  . naproxen (NAPROSYN) 500 MG tablet Take 1 tablet (500 mg total) by mouth 2 (two) times daily with a meal.  . sildenafil (VIAGRA) 100 MG tablet Take 1 tablet (100 mg total) by mouth daily as needed for erectile dysfunction. Take two hours prior to intercourse on an empty stomach  . traMADol (ULTRAM) 50 MG tablet Take 1 tablet (50 mg total) by mouth every 6 (six) hours as needed.   No facility-administered medications prior to visit.    Review of Systems  {Heme  Chem   Endocrine  Serology  Results Review (optional):23779::" "}  Objective    There were no vitals taken for this visit. {Show previous vital signs (optional):23777::" "}    Assessment & Plan     ***  No follow-ups on file.    I discussed the assessment and treatment plan with the patient. The patient was provided an opportunity to ask questions and all were answered. The patient agreed with the plan and demonstrated an understanding of the instructions.   The patient was advised to call back or seek an in-person evaluation if the symptoms worsen or if the condition fails to improve as anticipated.  I provided *** minutes of non-face-to-face time during this encounter.  {provider attestation***:1}  [facility] [phone] (phone) [phone] (fax)  [facility]

## 2020-05-08 ENCOUNTER — Encounter: Payer: Self-pay | Admitting: Radiology

## 2020-05-08 ENCOUNTER — Emergency Department: Payer: PRIVATE HEALTH INSURANCE

## 2020-05-08 ENCOUNTER — Emergency Department
Admission: EM | Admit: 2020-05-08 | Discharge: 2020-05-08 | Disposition: A | Discharge status: Home / Self Care | Payer: PRIVATE HEALTH INSURANCE | Attending: Emergency Medicine | Admitting: Emergency Medicine

## 2020-05-08 DIAGNOSIS — J40 Bronchitis, not specified as acute or chronic: Secondary | ICD-10-CM

## 2020-05-08 DIAGNOSIS — S39011A Strain of muscle, fascia and tendon of abdomen, initial encounter: Secondary | ICD-10-CM

## 2020-05-08 DIAGNOSIS — R0602 Shortness of breath: Secondary | ICD-10-CM

## 2020-05-08 LAB — RESPIRATORY PANEL BY RT PCR (FLU A&B, COVID)
Influenza A by PCR: NEGATIVE
Influenza B by PCR: NEGATIVE
SARS Coronavirus 2 by RT PCR: NEGATIVE

## 2020-05-08 LAB — TROPONIN I (HIGH SENSITIVITY): Troponin I (High Sensitivity): 11 ng/L (ref <18)

## 2020-05-08 LAB — BRAIN NATRIURETIC PEPTIDE: B Natriuretic Peptide: 26.2 pg/mL (ref 0.0–100.0)

## 2020-05-08 MED ORDER — IOHEXOL 300 MG/ML  SOLN
100.0000 mL | Freq: Once | INTRAMUSCULAR | Status: AC | PRN
  Administered 2020-05-08: 100 mL via INTRAVENOUS

## 2020-05-08 MED ORDER — ALBUTEROL SULFATE HFA 108 (90 BASE) MCG/ACT IN AERS: 2.0000 | INHALATION_SPRAY | Freq: Four times a day (QID) | RESPIRATORY_TRACT | 0 refills | Status: DC | PRN

## 2020-05-08 MED ORDER — KETOROLAC TROMETHAMINE 30 MG/ML IJ SOLN
15.0000 mg | Freq: Once | INTRAMUSCULAR | Status: AC
  Administered 2020-05-08: 15 mg via INTRAVENOUS
  Filled 2020-05-08: qty 1

## 2020-05-08 NOTE — ED Notes (Signed)
Patient transported to CT 

## 2020-05-08 NOTE — ED Provider Notes (Signed)
[facility] Emergency Department Provider Note   ____________________________________________   First MD Initiated Contact with Patient 05/08/20 0217     (approximate)  I have reviewed the triage vital signs and the nursing notes.   HISTORY  Chief Complaint Shortness of Breath    HPI Rickey Lane is a 54 y.o. male with past medical history of atrial fibrillation, kidney stones, and GERD who presents to the ED complaining of shortness of breath and groin pain.  Patient reports that he has had 2 to 3 days of coughing fits, which are nonproductive but it caused his throat to feel like it is closing up on him.  Between these fits he does feel out of breath, even at rest.  He denies any fevers or chest pain, has not had any sick contacts, but is unvaccinated against COVID-19.  He also endorses some pain in the right lower quadrant of his abdomen moving into his right groin.  He states that seems to be worse with certain positions and with his coughing fits, but he denies any associated vomiting, diarrhea, or urinary symptoms.  Current symptoms are similar to prior kidney stones.        Past Medical History:  Diagnosis Date  . Atrial fibrillation (HCC)    a. initially diagnosed in 09/2015. Reported a history of "irregular HR" for 3+ years  . Dysrhythmia   . GERD (gastroesophageal reflux disease)   . Headache   . Kidney stone   . Sleep apnea     Patient Active Problem List   Diagnosis Date Noted  . Essential hypertension 05/21/2017  . Erectile dysfunction 05/21/2017  . Benign prostatic hyperplasia with post-void dribbling 05/21/2017  . Left nephrolithiasis 05/21/2017  . Renal cyst, left 05/21/2017  . BMI 29.0-29.9,adult 05/21/2017  . Current every day smoker 05/21/2017  . Persistent atrial fibrillation [facility])     Past Surgical History:  Procedure Laterality Date  . ELECTROPHYSIOLOGIC STUDY N/A 11/04/2015   Procedure: CARDIOVERSION;  Surgeon:  Iran Ouch, MD;  Location: [facility] ORS;  Service: Cardiovascular;  Laterality: N/A;  . lithotripsy      Prior to Admission medications   Medication Sig Start Date End Date Taking? Authorizing Provider  acetaminophen (TYLENOL) 500 MG tablet Take 500 mg by mouth every 6 (six) hours as needed.    [provider]  albuterol (VENTOLIN HFA) 108 (90 Base) MCG/ACT inhaler Inhale 2 puffs into the lungs every 6 (six) hours as needed for wheezing or shortness of breath. 05/08/20   Chesley Noon, MD  aspirin EC 325 MG tablet Take 325 mg by mouth every 6 (six) hours as needed.     [provider]  Multiple Vitamin (MULTI-VITAMINS) TABS Take by mouth.    [provider]  naproxen (NAPROSYN) 500 MG tablet Take 1 tablet (500 mg total) by mouth 2 (two) times daily with a meal. 04/05/20   Bridget Hartshorn L, PA-C  sildenafil (VIAGRA) 100 MG tablet Take 1 tablet (100 mg total) by mouth daily as needed for erectile dysfunction. Take two hours prior to intercourse on an empty stomach 07/22/19   McGowan, Carollee Herter A, PA-C  traMADol (ULTRAM) 50 MG tablet Take 1 tablet (50 mg total) by mouth every 6 (six) hours as needed. 04/05/20   Tommi Rumps, PA-C    Allergies Nitroglycerin  Family History  Problem Relation Age of Onset  . Heart attack Father        Initial MI age 43, deceased in  his 60's from a massive MI  . Heart attack Maternal Aunt 55  . Prostate cancer Maternal Grandfather   . Kidney cancer Neg Hx   . Bladder Cancer Neg Hx     Social History Social History   Tobacco Use  . Smoking status: Former Smoker    Packs/day: 1.00    Years: 20.00    Pack years: 20.00    Types: Cigarettes    Quit date: 01/24/2018    Years since quitting: 2.2  . Smokeless tobacco: Never Used  Vaping Use  . Vaping Use: Never used  Substance Use Topics  . Alcohol use: No  . Drug use: No    Review of Systems  Constitutional: No fever/chills Eyes: No visual changes. ENT: No sore  throat. Cardiovascular: Denies chest pain. Respiratory: Positive for cough and shortness of breath. Gastrointestinal: Positive for abdominal pain.  No nausea, no vomiting.  No diarrhea.  No constipation. Genitourinary: Negative for dysuria. Musculoskeletal: Negative for back pain. Skin: Negative for rash. Neurological: Negative for headaches, focal weakness or numbness.  ____________________________________________   PHYSICAL EXAM:  VITAL SIGNS: ED Triage Vitals [05/07/20 2142]  Enc Vitals Group     BP (!) 164/14     Pulse Rate (!) 114     Resp (!) 22     Temp 99 F (37.2 C)     Temp src      SpO2 98 %     Weight 220 lb (99.8 kg)     Height 5\' 10"  (1.778 m)     Head Circumference      Peak Flow      Pain Score 8     Pain Loc      Pain Edu?      Excl. in GC?     Constitutional: Alert and oriented. Eyes: Conjunctivae are normal. Head: Atraumatic. Nose: No congestion/rhinnorhea. Mouth/Throat: Mucous membranes are moist. Neck: Normal ROM Cardiovascular: Normal rate, regular rhythm. Grossly normal heart sounds. Respiratory: Normal respiratory effort.  No retractions. Lungs CTAB. Gastrointestinal: Soft and tender to palpation in the right lower quadrant with no rebound or guarding.  No CVA tenderness bilaterally.. No distention. Genitourinary: deferred Musculoskeletal: No lower extremity tenderness nor edema. Neurologic:  Normal speech and language. No gross focal neurologic deficits are appreciated. Skin:  Skin is warm, dry and intact. No rash noted. Psychiatric: Mood and affect are normal. Speech and behavior are normal.  ____________________________________________   LABS (all labs ordered are listed, but only abnormal results are displayed)  Labs Reviewed  URINALYSIS, COMPLETE (UACMP) WITH MICROSCOPIC - Abnormal; Notable for the following components:      Result Value   Color, Urine YELLOW (*)    APPearance CLOUDY (*)    Glucose, UA 150 (*)    All other  components within normal limits  CBC - Abnormal; Notable for the following components:   WBC 10.7 (*)    All other components within normal limits  BASIC METABOLIC PANEL - Abnormal; Notable for the following components:   CO2 20 (*)    Glucose, Bld 133 (*)    BUN 26 (*)    All other components within normal limits  RESPIRATORY PANEL BY RT PCR (FLU A&B, COVID)  BRAIN NATRIURETIC PEPTIDE  TROPONIN I (HIGH SENSITIVITY)   ____________________________________________  EKG  ED ECG REPORT I, , the attending physician, personally viewed and interpreted this ECG.   Date: 05/08/2020  EKG Time: 21:49  Rate: 102  Rhythm: Atrial fibrillation  Axis:  Normal  Intervals:none  ST&T Change: None   PROCEDURES  Procedure(s) performed (including Critical Care):  Procedures   ____________________________________________   INITIAL IMPRESSION / ASSESSMENT AND PLAN / ED COURSE       54 year old male with past medical history of permanent atrial fibrillation and GERD who presents to the ED complaining of cough and shortness of breath along with right lower quadrant abdominal pain.  He is not in any respiratory distress and is maintaining O2 sats on room air, lungs clear to auscultation bilaterally.  Chest x-ray reviewed by me and shows no infiltrate, edema, or effusion.  EKG shows atrial fibrillation, which patient has a known history of, no ischemic changes noted.  Rate is well controlled at this time and I doubt his atrial fibrillation is contributing to his current symptoms.  We will add on troponin and BNP, but I am more suspicious for viral illness such as COVID-19 as the cause of his symptoms.  We will perform Covid testing, also further assess his abdominal pain with CT scan.  UA shows no evidence of UTI or hematuria.  CT scan is negative for appendicitis or other acute process, shows only nonobstructing kidney stones that would be unlikely to cause patient's pain.  Pain is  improved following IV Toradol and COVID-19 testing is negative.  I suspect his symptoms are due to bronchitis or other viral illness with abdominal pain stemming from a muscle strain.  He is appropriate for discharge home with PCP follow-up, was counseled to return to the ED for new or worsening symptoms.  Patient agrees with plan.      ____________________________________________   FINAL CLINICAL IMPRESSION(S) / ED DIAGNOSES  Final diagnoses:  Shortness of breath  Bronchitis  Strain of abdominal muscle, initial encounter     ED Discharge Orders         Ordered    albuterol (VENTOLIN HFA) 108 (90 Base) MCG/ACT inhaler  Every 6 hours PRN       Note to Pharmacy: Please supply with spacer   05/08/20 0518           Note:  This document was prepared using Dragon voice recognition software and may include unintentional dictation errors.   Chesley Noon, MD 05/08/20 [phone]

## 2020-05-10 ENCOUNTER — Telehealth: Payer: PRIVATE HEALTH INSURANCE | Admitting: Physician Assistant

## 2020-05-21 ENCOUNTER — Other Ambulatory Visit: Payer: Self-pay

## 2020-05-21 ENCOUNTER — Emergency Department
Admission: EM | Admit: 2020-05-21 | Discharge: 2020-05-21 | Disposition: A | Discharge status: Home / Self Care | Payer: Self-pay | Attending: Emergency Medicine | Admitting: Emergency Medicine

## 2020-05-21 DIAGNOSIS — I10 Essential (primary) hypertension: Secondary | ICD-10-CM | POA: Insufficient documentation

## 2020-05-21 DIAGNOSIS — Z7982 Long term (current) use of aspirin: Secondary | ICD-10-CM | POA: Insufficient documentation

## 2020-05-21 DIAGNOSIS — M545 Low back pain, unspecified: Secondary | ICD-10-CM | POA: Insufficient documentation

## 2020-05-21 DIAGNOSIS — R1032 Left lower quadrant pain: Secondary | ICD-10-CM | POA: Insufficient documentation

## 2020-05-21 DIAGNOSIS — Z87891 Personal history of nicotine dependence: Secondary | ICD-10-CM | POA: Insufficient documentation

## 2020-05-21 LAB — CBC
HCT: 47.9 % (ref 39.0–52.0)
Hemoglobin: 16.8 g/dL (ref 13.0–17.0)
MCH: 31.3 pg (ref 26.0–34.0)
MCHC: 35.1 g/dL (ref 30.0–36.0)
MCV: 89.2 fL (ref 80.0–100.0)
Platelets: 249 10*3/uL (ref 150–400)
RBC: 5.37 MIL/uL (ref 4.22–5.81)
RDW: 12.1 % (ref 11.5–15.5)
WBC: 11.5 10*3/uL — ABNORMAL HIGH (ref 4.0–10.5)
nRBC: 0 % (ref 0.0–0.2)

## 2020-05-21 LAB — LIPASE, BLOOD: Lipase: 35 U/L (ref 11–51)

## 2020-05-21 LAB — COMPREHENSIVE METABOLIC PANEL
ALT: 36 U/L (ref 0–44)
AST: 29 U/L (ref 15–41)
Albumin: 4.8 g/dL (ref 3.5–5.0)
Alkaline Phosphatase: 68 U/L (ref 38–126)
Anion gap: 12 (ref 5–15)
BUN: 23 mg/dL — ABNORMAL HIGH (ref 6–20)
CO2: 24 mmol/L (ref 22–32)
Calcium: 10.2 mg/dL (ref 8.9–10.3)
Chloride: 100 mmol/L (ref 98–111)
Creatinine, Ser: 1.46 mg/dL — ABNORMAL HIGH (ref 0.61–1.24)
GFR, Estimated: 57 mL/min — ABNORMAL LOW (ref >60)
Glucose, Bld: 113 mg/dL — ABNORMAL HIGH (ref 70–99)
Potassium: 3.9 mmol/L (ref 3.5–5.1)
Sodium: 136 mmol/L (ref 135–145)
Total Bilirubin: 1.2 mg/dL (ref 0.3–1.2)
Total Protein: 8.1 g/dL (ref 6.5–8.1)

## 2020-05-21 MED ORDER — ONDANSETRON HCL 4 MG/2ML IJ SOLN
4.0000 mg | Freq: Once | INTRAMUSCULAR | Status: AC
  Administered 2020-05-21: 4 mg via INTRAVENOUS
  Filled 2020-05-21: qty 2

## 2020-05-21 MED ORDER — SODIUM CHLORIDE 0.9 % IV BOLUS
1000.0000 mL | Freq: Once | INTRAVENOUS | Status: AC
  Administered 2020-05-21: 1000 mL via INTRAVENOUS

## 2020-05-21 MED ORDER — FENTANYL CITRATE (PF) 100 MCG/2ML IJ SOLN
50.0000 ug | INTRAMUSCULAR | Status: DC | PRN
  Administered 2020-05-21: 50 ug via INTRAVENOUS
  Filled 2020-05-21: qty 2

## 2020-05-21 NOTE — ED Provider Notes (Signed)
[facility] Emergency Department Provider Note   ____________________________________________   I have reviewed the triage vital signs and the nursing notes.   HISTORY  Chief Complaint Abdominal Pain and Back Pain   History limited by: Not Limited   HPI Rickey Lane is a 54 y.o. male who presents to the emergency department today because of concerns for left lower back and abdominal pain.  Patient states that the pain gradually got worse and then became severe while he was at a [employer].  He denies any trauma.  He states that this was accompanied by nausea and he did have a couple episodes of vomiting.  He had not noticed any recent change in urination or defecation.  Patient states he has a history of kidney stones and this somewhat reminded him of the pain he had with previous stones. Denies any fevers.    Records reviewed. Per medical record review patient has a history of kidney stones. Recent CT does show bilateral stones.   Past Medical History:  Diagnosis Date  . Atrial fibrillation (HCC)    a. initially diagnosed in 09/2015. Reported a history of "irregular HR" for 3+ years  . Dysrhythmia   . GERD (gastroesophageal reflux disease)   . Headache   . Kidney stone   . Sleep apnea     Patient Active Problem List   Diagnosis Date Noted  . Essential hypertension 05/21/2017  . Erectile dysfunction 05/21/2017  . Benign prostatic hyperplasia with post-void dribbling 05/21/2017  . Left nephrolithiasis 05/21/2017  . Renal cyst, left 05/21/2017  . BMI 29.0-29.9,adult 05/21/2017  . Current every day smoker 05/21/2017  . Persistent atrial fibrillation [facility])     Past Surgical History:  Procedure Laterality Date  . ELECTROPHYSIOLOGIC STUDY N/A 11/04/2015   Procedure: CARDIOVERSION;  Surgeon: Iran Ouch, MD;  Location: [facility] ORS;  Service: Cardiovascular;  Laterality: N/A;  . lithotripsy      Prior to Admission medications   Medication Sig  Start Date End Date Taking? Authorizing Provider  acetaminophen (TYLENOL) 500 MG tablet Take 500 mg by mouth every 6 (six) hours as needed.    [provider]  albuterol (VENTOLIN HFA) 108 (90 Base) MCG/ACT inhaler Inhale 2 puffs into the lungs every 6 (six) hours as needed for wheezing or shortness of breath. 05/08/20   Chesley Noon, MD  aspirin EC 325 MG tablet Take 325 mg by mouth every 6 (six) hours as needed.     [provider]  Multiple Vitamin (MULTI-VITAMINS) TABS Take by mouth.    [provider]  naproxen (NAPROSYN) 500 MG tablet Take 1 tablet (500 mg total) by mouth 2 (two) times daily with a meal. 04/05/20   Bridget Hartshorn L, PA-C  sildenafil (VIAGRA) 100 MG tablet Take 1 tablet (100 mg total) by mouth daily as needed for erectile dysfunction. Take two hours prior to intercourse on an empty stomach 07/22/19   McGowan, Carollee Herter A, PA-C  traMADol (ULTRAM) 50 MG tablet Take 1 tablet (50 mg total) by mouth every 6 (six) hours as needed. 04/05/20   Tommi Rumps, PA-C    Allergies Nitroglycerin  Family History  Problem Relation Age of Onset  . Heart attack Father        Initial MI age 56, deceased in his 65's from a massive MI  . Heart attack Maternal Aunt 55  . Prostate cancer Maternal Grandfather   . Kidney cancer Neg Hx   . Bladder Cancer Neg Hx  Social History Social History   Tobacco Use  . Smoking status: Former Smoker    Packs/day: 1.00    Years: 20.00    Pack years: 20.00    Types: Cigarettes    Quit date: 01/24/2018    Years since quitting: 2.3  . Smokeless tobacco: Never Used  Vaping Use  . Vaping Use: Never used  Substance Use Topics  . Alcohol use: No  . Drug use: No    Review of Systems Constitutional: No fever/chills Eyes: No visual changes. ENT: No sore throat. Cardiovascular: Denies chest pain. Respiratory: Denies shortness of breath. Gastrointestinal: Positive for left lower abdominal pain.  Genitourinary:  Negative for dysuria. Musculoskeletal: Positive for left lower back pain.  Skin: Negative for rash. Neurological: Negative for headaches, focal weakness or numbness.  ____________________________________________   PHYSICAL EXAM:  VITAL SIGNS: ED Triage Vitals  Enc Vitals Group     BP 05/21/20 1601 (!) 151/104     Pulse Rate 05/21/20 1601 98     Resp 05/21/20 1601 18     Temp 05/21/20 1601 98 F (36.7 C)     Temp Source 05/21/20 2108 Oral     SpO2 05/21/20 1601 100 %     Weight 05/21/20 1601 220 lb (99.8 kg)     Height 05/21/20 1601 5\' 10"  (1.778 m)     Head Circumference --      Peak Flow --      Pain Score 05/21/20 1601 10   Constitutional: Alert and oriented.  Eyes: Conjunctivae are normal.  ENT      Head: Normocephalic and atraumatic.      Nose: No congestion/rhinnorhea.      Mouth/Throat: Mucous membranes are moist.      Neck: No stridor. Hematological/Lymphatic/Immunilogical: No cervical lymphadenopathy. Cardiovascular: Normal rate, regular rhythm.  No murmurs, rubs, or gallops.  Respiratory: Normal respiratory effort without tachypnea nor retractions. Breath sounds are clear and equal bilaterally. No wheezes/rales/rhonchi. Gastrointestinal: Soft and non tender. No rebound. No guarding.  Genitourinary: Deferred Musculoskeletal: Normal range of motion in all extremities. No lower extremity edema. Neurologic:  Normal speech and language. No gross focal neurologic deficits are appreciated.  Skin:  Skin is warm, dry and intact. No rash noted. Psychiatric: Mood and affect are normal. Speech and behavior are normal. Patient exhibits appropriate insight and judgment.  ____________________________________________    LABS (pertinent positives/negatives)  Lipase 35 CBC wbc 11.5, hgb 16.8, plt 249 CMP wnl except glu 113, bun 23, cr 1.46  ____________________________________________   EKG  None  ____________________________________________     RADIOLOGY  None  ____________________________________________   PROCEDURES  Procedures  ____________________________________________   INITIAL IMPRESSION / ASSESSMENT AND PLAN / ED COURSE  Pertinent labs & imaging results that were available during my care of the patient were reviewed by me and considered in my medical decision making (see chart for details).   Patient presented to the emergency department today because of concerns for left lower back and left lower abdominal pain.  By the time my exam the patient states that his pain had completely resolved.  Given the description of the pain and history of kidney stones I do think likely patient suffered another kidney stone.  I did however discussed with patient that I do not know for sure.  Did want to check a urine however patient stated he would rather just go home.  Discussed with patient my concern for possible infection.  Also discussed with patient possibility of obtaining CT imaging  to evaluate for kidney stone or other etiology.  At this time patient again stated he would defer.  I discussed with patient blood work finding of elevated creatinine.  Patient did receive fluids prior to my evaluation.  I discussed with patient that I would like him to follow-up with his primary care to repeat blood work. Discussed return precautions.   ____________________________________________   FINAL CLINICAL IMPRESSION(S) / ED DIAGNOSES  Final diagnoses:  Acute left-sided low back pain without sciatica  Left lower quadrant abdominal pain     Note: This dictation was prepared with Dragon dictation. Any transcriptional errors that result from this process are unintentional     Phineas Semen, MD 05/21/20 2210

## 2020-05-21 NOTE — ED Triage Notes (Addendum)
Pt comes via POV from home with c/o sudden onset of abdominal pain and back pain. Pt states it starts on the left side and radiates to his back. Pt states severe pain. Pt states he doesn't know if it is a kidney stone. Pt states no pain with urination.  Pt appears very uncomfortable. Pt states some nausea. Pt moaning and unable to sit still.

## 2020-05-21 NOTE — Discharge Instructions (Addendum)
As we discussed it is likely that this was a kidney stone, however without urine or imaging it is hard to say for sure. If you did develop fever, worsening pain, persistent vomiting or any other concerning symptoms please be sure to return.

## 2020-05-22 ENCOUNTER — Encounter: Payer: Self-pay | Admitting: Emergency Medicine

## 2020-05-22 ENCOUNTER — Emergency Department: Payer: Self-pay

## 2020-05-22 ENCOUNTER — Other Ambulatory Visit: Payer: Self-pay

## 2020-05-22 ENCOUNTER — Inpatient Hospital Stay
Admission: EM | Admit: 2020-05-22 | Discharge: 2020-06-01 | DRG: 660 | Disposition: A | Discharge status: Home / Self Care | Payer: Self-pay | Attending: Internal Medicine | Admitting: Internal Medicine

## 2020-05-22 DIAGNOSIS — N132 Hydronephrosis with renal and ureteral calculous obstruction: Principal | ICD-10-CM | POA: Diagnosis present

## 2020-05-22 DIAGNOSIS — Z91138 Patient's unintentional underdosing of medication regimen for other reason: Secondary | ICD-10-CM

## 2020-05-22 DIAGNOSIS — K219 Gastro-esophageal reflux disease without esophagitis: Secondary | ICD-10-CM | POA: Diagnosis present

## 2020-05-22 DIAGNOSIS — Z888 Allergy status to other drugs, medicaments and biological substances status: Secondary | ICD-10-CM

## 2020-05-22 DIAGNOSIS — R000 Tachycardia, unspecified: Secondary | ICD-10-CM | POA: Diagnosis not present

## 2020-05-22 DIAGNOSIS — D72829 Elevated white blood cell count, unspecified: Secondary | ICD-10-CM | POA: Diagnosis present

## 2020-05-22 DIAGNOSIS — K5900 Constipation, unspecified: Secondary | ICD-10-CM | POA: Diagnosis present

## 2020-05-22 DIAGNOSIS — N139 Obstructive and reflux uropathy, unspecified: Secondary | ICD-10-CM

## 2020-05-22 DIAGNOSIS — Z7982 Long term (current) use of aspirin: Secondary | ICD-10-CM

## 2020-05-22 DIAGNOSIS — R519 Headache, unspecified: Secondary | ICD-10-CM | POA: Diagnosis present

## 2020-05-22 DIAGNOSIS — N179 Acute kidney failure, unspecified: Secondary | ICD-10-CM | POA: Diagnosis present

## 2020-05-22 DIAGNOSIS — I10 Essential (primary) hypertension: Secondary | ICD-10-CM | POA: Diagnosis present

## 2020-05-22 DIAGNOSIS — Z79899 Other long term (current) drug therapy: Secondary | ICD-10-CM

## 2020-05-22 DIAGNOSIS — Z5900 Homelessness unspecified: Secondary | ICD-10-CM

## 2020-05-22 DIAGNOSIS — N201 Calculus of ureter: Secondary | ICD-10-CM

## 2020-05-22 DIAGNOSIS — Z87891 Personal history of nicotine dependence: Secondary | ICD-10-CM

## 2020-05-22 DIAGNOSIS — Z8249 Family history of ischemic heart disease and other diseases of the circulatory system: Secondary | ICD-10-CM

## 2020-05-22 DIAGNOSIS — R110 Nausea: Secondary | ICD-10-CM | POA: Diagnosis present

## 2020-05-22 DIAGNOSIS — N200 Calculus of kidney: Secondary | ICD-10-CM

## 2020-05-22 DIAGNOSIS — N529 Male erectile dysfunction, unspecified: Secondary | ICD-10-CM | POA: Diagnosis present

## 2020-05-22 DIAGNOSIS — Z9114 Patient's other noncompliance with medication regimen: Secondary | ICD-10-CM

## 2020-05-22 DIAGNOSIS — Z20822 Contact with and (suspected) exposure to covid-19: Secondary | ICD-10-CM | POA: Diagnosis present

## 2020-05-22 DIAGNOSIS — I4821 Permanent atrial fibrillation: Secondary | ICD-10-CM | POA: Diagnosis present

## 2020-05-22 DIAGNOSIS — T50996A Underdosing of other drugs, medicaments and biological substances, initial encounter: Secondary | ICD-10-CM | POA: Diagnosis present

## 2020-05-22 DIAGNOSIS — G4733 Obstructive sleep apnea (adult) (pediatric): Secondary | ICD-10-CM | POA: Diagnosis present

## 2020-05-22 DIAGNOSIS — Z8042 Family history of malignant neoplasm of prostate: Secondary | ICD-10-CM

## 2020-05-22 LAB — CBC
HCT: 45.2 % (ref 39.0–52.0)
Hemoglobin: 15.9 g/dL (ref 13.0–17.0)
MCH: 31.1 pg (ref 26.0–34.0)
MCHC: 35.2 g/dL (ref 30.0–36.0)
MCV: 88.3 fL (ref 80.0–100.0)
Platelets: 214 10*3/uL (ref 150–400)
RBC: 5.12 MIL/uL (ref 4.22–5.81)
RDW: 12.1 % (ref 11.5–15.5)
WBC: 14.9 10*3/uL — ABNORMAL HIGH (ref 4.0–10.5)
nRBC: 0 % (ref 0.0–0.2)

## 2020-05-22 LAB — COMPREHENSIVE METABOLIC PANEL
ALT: 34 U/L (ref 0–44)
AST: 27 U/L (ref 15–41)
Albumin: 4.5 g/dL (ref 3.5–5.0)
Alkaline Phosphatase: 64 U/L (ref 38–126)
Anion gap: 13 (ref 5–15)
BUN: 22 mg/dL — ABNORMAL HIGH (ref 6–20)
CO2: 22 mmol/L (ref 22–32)
Calcium: 10.6 mg/dL — ABNORMAL HIGH (ref 8.9–10.3)
Chloride: 106 mmol/L (ref 98–111)
Creatinine, Ser: 1.32 mg/dL — ABNORMAL HIGH (ref 0.61–1.24)
GFR, Estimated: >60 mL/min (ref >60)
Glucose, Bld: 118 mg/dL — ABNORMAL HIGH (ref 70–99)
Potassium: 3.6 mmol/L (ref 3.5–5.1)
Sodium: 141 mmol/L (ref 135–145)
Total Bilirubin: 1.2 mg/dL (ref 0.3–1.2)
Total Protein: 7.7 g/dL (ref 6.5–8.1)

## 2020-05-22 LAB — URINALYSIS, COMPLETE (UACMP) WITH MICROSCOPIC
Bacteria, UA: NONE SEEN
Bilirubin Urine: NEGATIVE
Glucose, UA: NEGATIVE mg/dL
Ketones, ur: NEGATIVE mg/dL
Leukocytes,Ua: NEGATIVE
Nitrite: NEGATIVE
Protein, ur: 30 mg/dL — AB
RBC / HPF: >50 RBC/hpf — ABNORMAL HIGH (ref 0–5)
Specific Gravity, Urine: 1.026 (ref 1.005–1.030)
pH: 5 (ref 5.0–8.0)

## 2020-05-22 LAB — RESP PANEL BY RT-PCR (FLU A&B, COVID) ARPGX2
Influenza A by PCR: NEGATIVE
Influenza B by PCR: NEGATIVE
SARS Coronavirus 2 by RT PCR: NEGATIVE

## 2020-05-22 LAB — LIPASE, BLOOD: Lipase: 32 U/L (ref 11–51)

## 2020-05-22 MED ORDER — ONDANSETRON HCL 4 MG/2ML IJ SOLN
4.0000 mg | Freq: Once | INTRAMUSCULAR | Status: AC
  Administered 2020-05-22: 4 mg via INTRAVENOUS

## 2020-05-22 MED ORDER — TAMSULOSIN HCL 0.4 MG PO CAPS: 0.4000 mg | ORAL_CAPSULE | Freq: Every day | ORAL | 0 refills | Status: DC

## 2020-05-22 MED ORDER — LACTATED RINGERS IV BOLUS
1000.0000 mL | Freq: Once | INTRAVENOUS | Status: AC
  Administered 2020-05-22: 1000 mL via INTRAVENOUS

## 2020-05-22 MED ORDER — ENOXAPARIN SODIUM 60 MG/0.6ML ~~LOC~~ SOLN
0.5000 mg/kg | SUBCUTANEOUS | Status: DC
  Administered 2020-05-23 – 2020-05-24 (×3): 50 mg via SUBCUTANEOUS
  Filled 2020-05-22 (×4): qty 0.6

## 2020-05-22 MED ORDER — TAMSULOSIN HCL 0.4 MG PO CAPS
0.4000 mg | ORAL_CAPSULE | Freq: Once | ORAL | Status: AC
  Administered 2020-05-22: 0.4 mg via ORAL
  Filled 2020-05-22: qty 1

## 2020-05-22 MED ORDER — MORPHINE SULFATE (PF) 4 MG/ML IV SOLN
6.0000 mg | Freq: Once | INTRAVENOUS | Status: AC
  Administered 2020-05-22: 6 mg via INTRAVENOUS
  Filled 2020-05-22: qty 2

## 2020-05-22 MED ORDER — SODIUM CHLORIDE 0.9 % IV SOLN: INTRAVENOUS | Status: DC

## 2020-05-22 MED ORDER — SENNOSIDES-DOCUSATE SODIUM 8.6-50 MG PO TABS
1.0000 | ORAL_TABLET | Freq: Every evening | ORAL | Status: DC | PRN
  Administered 2020-05-27: 1 via ORAL
  Filled 2020-05-22: qty 1

## 2020-05-22 MED ORDER — ACETAMINOPHEN 500 MG PO TABS
500.0000 mg | ORAL_TABLET | Freq: Four times a day (QID) | ORAL | Status: DC | PRN
  Administered 2020-05-25 (×3): 500 mg via ORAL
  Filled 2020-05-22 (×3): qty 1

## 2020-05-22 MED ORDER — ONDANSETRON HCL 4 MG PO TABS: 4.0000 mg | ORAL_TABLET | Freq: Four times a day (QID) | ORAL | Status: DC | PRN

## 2020-05-22 MED ORDER — HYDROMORPHONE HCL 1 MG/ML IJ SOLN
1.0000 mg | INTRAMUSCULAR | Status: AC
  Administered 2020-05-22: 1 mg via INTRAVENOUS
  Filled 2020-05-22: qty 1

## 2020-05-22 MED ORDER — KETOROLAC TROMETHAMINE 10 MG PO TABS: 10.0000 mg | ORAL_TABLET | Freq: Four times a day (QID) | ORAL | 0 refills | Status: DC | PRN

## 2020-05-22 MED ORDER — ONDANSETRON 4 MG PO TBDP: 4.0000 mg | ORAL_TABLET | Freq: Three times a day (TID) | ORAL | 0 refills | Status: DC | PRN

## 2020-05-22 MED ORDER — ONDANSETRON 4 MG PO TBDP: 4.0000 mg | ORAL_TABLET | Freq: Once | ORAL | Status: DC

## 2020-05-22 MED ORDER — HYDROMORPHONE HCL 1 MG/ML IJ SOLN
0.5000 mg | INTRAMUSCULAR | Status: DC | PRN
  Administered 2020-05-22: 1 mg via INTRAVENOUS
  Administered 2020-05-23 (×2): 0.5 mg via INTRAVENOUS
  Administered 2020-05-24 – 2020-05-27 (×3): 1 mg via INTRAVENOUS
  Filled 2020-05-22 (×6): qty 1

## 2020-05-22 MED ORDER — FENTANYL CITRATE (PF) 100 MCG/2ML IJ SOLN
50.0000 ug | Freq: Once | INTRAMUSCULAR | Status: AC
  Administered 2020-05-23: 50 ug via INTRAVENOUS
  Filled 2020-05-22: qty 2

## 2020-05-22 MED ORDER — KETOROLAC TROMETHAMINE 30 MG/ML IJ SOLN
15.0000 mg | INTRAMUSCULAR | Status: AC
  Administered 2020-05-22: 15 mg via INTRAVENOUS
  Filled 2020-05-22: qty 1

## 2020-05-22 MED ORDER — ONDANSETRON HCL 4 MG/2ML IJ SOLN
4.0000 mg | Freq: Once | INTRAMUSCULAR | Status: DC
  Filled 2020-05-22: qty 2

## 2020-05-22 MED ORDER — TRAMADOL HCL 50 MG PO TABS
50.0000 mg | ORAL_TABLET | Freq: Four times a day (QID) | ORAL | Status: DC | PRN
  Administered 2020-05-22: 50 mg via ORAL
  Filled 2020-05-22: qty 1

## 2020-05-22 MED ORDER — ONDANSETRON HCL 4 MG/2ML IJ SOLN
4.0000 mg | Freq: Four times a day (QID) | INTRAMUSCULAR | Status: DC | PRN
  Administered 2020-05-24: 4 mg via INTRAVENOUS
  Filled 2020-05-22: qty 2

## 2020-05-22 NOTE — ED Notes (Signed)
Pt reports no pain while sleeping, "but she woke me up and said I needed to wear this oxygen, now I hurt 7/10 but how do I get the sleep apnea stuff for my nose like this"?  Explained to pt that he has to f/u with PCP and sleep study for same pending insurance approval and he voiced understanding.  Pt given Tramadol po for 7/10 pain, oxygen in place while sleeping.  Currently even prior to po med administration pt sts "i'm ok, I don't need anything right now".  Pt medicated and given all information regarding sleep apnea that he sts "I've heard all this same stuff before".

## 2020-05-22 NOTE — ED Notes (Signed)
Covid swab obtained and sent to lab.

## 2020-05-22 NOTE — ED Notes (Signed)
Pt with oxygen sat at 79% awakes with verbal stimuli, pt sts has hx of sleep apnea.  Placed on 3 lpnc while sleeping.  No c/o currently

## 2020-05-22 NOTE — ED Triage Notes (Signed)
Pt reports sudden onset of pain to his left back and flank that now radiates into his left lower abd and groin. Pt also with NV. Pt nauseated in triage. Pt reports was seen here last night for the same.
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
## 2020-05-22 NOTE — Progress Notes (Signed)
PHARMACIST - PHYSICIAN COMMUNICATION  CONCERNING:  Enoxaparin (Lovenox) for DVT Prophylaxis    RECOMMENDATION: Patient was prescribed enoxaparin 40mg  q24 hours for VTE prophylaxis.   Filed Weights   05/22/20 1333  Weight: 99.8 kg (220 lb)    Body mass index is 31.57 kg/m.  Estimated Creatinine Clearance: 76.6 mL/min (A) (by C-G formula based on SCr of 1.32 mg/dL (H)).   Based on [facility] policy patient is candidate for enoxaparin 0.5mg /kg TBW SQ every 24 hours based on BMI being >30.  DESCRIPTION: Pharmacy has adjusted enoxaparin dose per [facility] policy.  Patient is now receiving enoxaparin 50 mg every 24 hours   [facility] 05/22/2020 7:09 PM

## 2020-05-22 NOTE — H&P (Signed)
Chief Complaint: Patient presented to the ER for the second time in 2 days on account of left flank pain HPI:   Rickey Lane is an 54 y.o. male with medical history significant for paroxysmal atrial fibrillation status post cardioversion in the past.  History of recurrent kidney stones in the past that required lithotripsy for treatment.  He presents to the emergency room for the second time in 2 days on account of left lower quadrant and groin pain.  Symptoms were consistent with his previous kidney stones flare.  Symptoms have been associated with nausea and vomiting.  He was seen in the ED yesterday and was discharged.  He returns to the ED on account of severe 10/10 left quadrant abdominal pain.  Work-up including CT did reveal multiple left sided kidney stones with largest stone measuring 9 mm.  There was some evidence of surrounding left-sided hydronephrosis.   Past Medical History:  Diagnosis Date  . Atrial fibrillation (HCC)    a. initially diagnosed in 09/2015. Reported a history of "irregular HR" for 3+ years  . Dysrhythmia   . GERD (gastroesophageal reflux disease)   . Headache   . Kidney stone   . Sleep apnea     Past Surgical History:  Procedure Laterality Date  . ELECTROPHYSIOLOGIC STUDY N/A 11/04/2015   Procedure: CARDIOVERSION;  Surgeon: Iran Ouch, MD;  Location: [facility] ORS;  Service: Cardiovascular;  Laterality: N/A;  . lithotripsy      Family History  Problem Relation Age of Onset  . Heart attack Father        Initial MI age 64, deceased in his 17's from a massive MI  . Heart attack Maternal Aunt 55  . Prostate cancer Maternal Grandfather   . Kidney cancer Neg Hx   . Bladder Cancer Neg Hx    Social History:  reports that he quit smoking about 2 years ago. His smoking use included cigarettes. He has a 20.00 pack-year smoking history. He has never used smokeless tobacco. He reports that he does not drink alcohol and does not use drugs.  Allergies:   Allergies  Allergen Reactions  . Nitroglycerin Other (See Comments)    "Shivering" and "decreased heart rate"    (Not in a hospital admission)   Results for orders placed or performed during the hospital encounter of 05/22/20 (from the past 48 hour(s))  Urinalysis, Complete w Microscopic Urine, Clean Catch     Status: Abnormal   Collection Time: 05/22/20  1:37 PM  Result Value Ref Range   Color, Urine YELLOW (A) YELLOW   APPearance CLEAR (A) CLEAR   Specific Gravity, Urine 1.026 1.005 - 1.030   pH 5.0 5.0 - 8.0   Glucose, UA NEGATIVE NEGATIVE mg/dL   Hgb urine dipstick LARGE (A) NEGATIVE   Bilirubin Urine NEGATIVE NEGATIVE   Ketones, ur NEGATIVE NEGATIVE mg/dL   Protein, ur 30 (A) NEGATIVE mg/dL   Nitrite NEGATIVE NEGATIVE   Leukocytes,Ua NEGATIVE NEGATIVE   RBC / HPF >50 (H) 0 - 5 RBC/hpf   WBC, UA 0-5 0 - 5 WBC/hpf   Bacteria, UA NONE SEEN NONE SEEN   Squamous Epithelial / LPF 0-5 0 - 5   Mucus PRESENT     Comment: Performed at [facility], [address] [place], [place] [address]  Lipase, blood     Status: None   Collection Time: 05/22/20  1:56 PM  Result Value Ref Range   Lipase 32 11 - 51 U/L  Comment: Performed at [facility], [address] [place], [place] [address]  Comprehensive metabolic panel     Status: Abnormal   Collection Time: 05/22/20  1:56 PM  Result Value Ref Range   Sodium 141 135 - 145 mmol/L   Potassium 3.6 3.5 - 5.1 mmol/L   Chloride 106 98 - 111 mmol/L   CO2 22 22 - 32 mmol/L   Glucose, Bld 118 (H) 70 - 99 mg/dL    Comment: Glucose reference range applies only to samples taken after fasting for at least 8 hours.   BUN 22 (H) 6 - 20 mg/dL   Creatinine, Ser 5.17 (H) 0.61 - 1.24 mg/dL   Calcium 00.1 (H) 8.9 - 10.3 mg/dL   Total Protein 7.7 6.5 - 8.1 g/dL   Albumin 4.5 3.5 - 5.0 g/dL   AST 27 15 - 41 U/L   ALT 34 0 - 44 U/L   Alkaline Phosphatase 64 38 - 126 U/L   Total Bilirubin 1.2 0.3 - 1.2 mg/dL   GFR,  Estimated >74 >94 mL/min    Comment: (NOTE) Calculated using the CKD-EPI Creatinine Equation (2021)    Anion gap 13 5 - 15    Comment: Performed at [facility], [address] [place], [place] [address]  CBC     Status: Abnormal   Collection Time: 05/22/20  1:56 PM  Result Value Ref Range   WBC 14.9 (H) 4.0 - 10.5 K/uL   RBC 5.12 4.22 - 5.81 MIL/uL   Hemoglobin 15.9 13.0 - 17.0 g/dL   HCT 91.6 39 - 52 %   MCV 88.3 80.0 - 100.0 fL   MCH 31.1 26.0 - 34.0 pg   MCHC 35.2 30.0 - 36.0 g/dL   RDW 38.4 66.5 - 99.3 %   Platelets 214 150 - 400 K/uL   nRBC 0.0 0.0 - 0.2 %    Comment: Performed at [facility], [address], [place], [place] [address]   CT Renal [place] Bras Study  Result Date: 05/22/2020 CLINICAL DATA:  Sudden onset of left back and flank pain. EXAM: CT ABDOMEN AND PELVIS WITHOUT CONTRAST TECHNIQUE: Multidetector CT imaging of the abdomen and pelvis was performed following the standard protocol without IV contrast. COMPARISON:  May 08, 2020 FINDINGS: Lower chest: No acute abnormality. Hepatobiliary: No focal liver abnormality is seen. No gallstones, gallbladder wall thickening, or biliary dilatation. Pancreas: Unremarkable. No pancreatic ductal dilatation or surrounding inflammatory changes. Spleen: Normal in size without focal abnormality. Adrenals/Urinary Tract: Normal adrenal glands. Tiny nonobstructive calculus, 1-2 mm in the midpolar region of right kidney. A collection of 213 ureteral stones in the proximal left ureter, slightly past the UPJ, the largest calculus measures 9 mm. Subsequent obstructive uropathy of the left kidney with moderate left hydronephrosis and proximal hydroureter. Additional nonobstructive 1-2 mm calculus in the upper pole of the left kidney. Normal appearance of the urinary bladder. Stomach/Bowel: Stomach is within normal limits. No evidence of appendicitis. No evidence of bowel wall thickening, distention, or inflammatory changes.  Vascular/Lymphatic: No significant vascular findings are present. No enlarged abdominal or pelvic lymph nodes. Reproductive: Prostate is unremarkable. Other: No abdominal wall hernia or abnormality. No abdominopelvic ascites. Musculoskeletal: Spondylosis of the thoracolumbar spine. IMPRESSION: 1. A collection of 2-3 ureteral stones in the proximal left ureter, slightly past the UPJ, with the largest calculus measuring 9 mm. Subsequent obstructive uropathy of the left kidney with moderate left hydronephrosis and proximal hydroureter. 2. Additional tiny nonobstructive bilateral renal calculi. Electronically  Signed   By: Ted Mcalpine M.D.   On: 05/22/2020 15:13    Review of Systems  Constitutional: Negative.   Eyes: Negative.   Respiratory: Negative.   Cardiovascular: Negative.   Gastrointestinal: Positive for abdominal pain.  Genitourinary: Positive for flank pain and frequency.  Neurological: Negative.   Psychiatric/Behavioral: Negative.     Blood pressure 135/81, pulse (!) 106, temperature 97.8 F (36.6 C), temperature source Oral, resp. rate 20, height 5\' 10"  (1.778 m), weight 99.8 kg, SpO2 93 %. Physical Exam Constitutional:      Appearance: Normal appearance.  HENT:     Head: Normocephalic.     Mouth/Throat:     Mouth: Mucous membranes are moist.  Cardiovascular:     Rate and Rhythm: Regular rhythm.     Pulses: Normal pulses.     Heart sounds: Normal heart sounds.  Abdominal:     Tenderness: There is left CVA tenderness.  Musculoskeletal:        General: Normal range of motion.     Cervical back: Neck supple.  Skin:    General: Skin is warm and dry.  Neurological:     General: No focal deficit present.     Mental Status: He is alert and oriented to person, place, and time. Mental status is at baseline.  Psychiatric:        Mood and Affect: Mood normal.      Assessment/Plan  54 year old Caucasian male with history of kidney stones in the past that was treated with  lithotripsy.  He presents for the second time in 2 days with acute onset left flank pain that is not relieved with p.o. analgesics.  #1.  Acute left flank pain secondary to multiple left kidney stones, largest measuring 9 mm with evidence of obstructive uropathy and left hydronephrosis.  Patient failed outpatient pain management.  Patient currently requiring IV medication for adequate pain control.  Urology was consulted from the emergency room, treatment with  IV fluids and Flomax were initiated.ED physician did discuss case with Urology Dr 40 who felt patient could be managed conservatively with follow up as out patient. Consider re-evaluation in a.m if pain persists on current  #2. Mild AKI: Likely prerenal + postobstructive in etiology.  IV fluids were offered.  Avoid nephrotoxic medication.  Urology consulted to evaluate.  #3.  Leukocytosis: Most likely reactive.  Urinalysis does not suggest evidence of UTI at this time.    Mena Goes, MD 05/22/2020, 7:09 PM

## 2020-05-22 NOTE — ED Provider Notes (Signed)
[facility] Emergency Department Provider Note  ____________________________________________  Time seen: Approximately 2:52 PM  I have reviewed the triage vital signs and the nursing notes.   HISTORY  Chief Complaint Flank Pain and Nausea    HPI Rickey Lane is a 54 y.o. male with a history of atrial fibrillation, GERD, kidney stones, smoking who comes ED complaining of left flank pain radiating to left lower quadrant and groin that started yesterday, waxing and waning/intermittent.  No aggravating or alleviating factors.  Currently severe.  No dysuria fevers chills chest pain or shortness of breath.  No vomiting.  He was seen in the ED yesterday, but at the time of evaluation his pain had resolved and so he refused additional work-up at that time.      Past Medical History:  Diagnosis Date  . Atrial fibrillation (HCC)    a. initially diagnosed in 09/2015. Reported a history of "irregular HR" for 3+ years  . Dysrhythmia   . GERD (gastroesophageal reflux disease)   . Headache   . Kidney stone   . Sleep apnea      Patient Active Problem List   Diagnosis Date Noted  . Essential hypertension 05/21/2017  . Erectile dysfunction 05/21/2017  . Benign prostatic hyperplasia with post-void dribbling 05/21/2017  . Left nephrolithiasis 05/21/2017  . Renal cyst, left 05/21/2017  . BMI 29.0-29.9,adult 05/21/2017  . Current every day smoker 05/21/2017  . Persistent atrial fibrillation [facility])      Past Surgical History:  Procedure Laterality Date  . ELECTROPHYSIOLOGIC STUDY N/A 11/04/2015   Procedure: CARDIOVERSION;  Surgeon: Iran Ouch, MD;  Location: [facility] ORS;  Service: Cardiovascular;  Laterality: N/A;  . lithotripsy       Prior to Admission medications   Medication Sig Start Date End Date Taking? Authorizing Provider  acetaminophen (TYLENOL) 500 MG tablet Take 500 mg by mouth every 6 (six) hours as needed.    [provider]   albuterol (VENTOLIN HFA) 108 (90 Base) MCG/ACT inhaler Inhale 2 puffs into the lungs every 6 (six) hours as needed for wheezing or shortness of breath. 05/08/20   Chesley Noon, MD  aspirin EC 325 MG tablet Take 325 mg by mouth every 6 (six) hours as needed.     [provider]  ketorolac (TORADOL) 10 MG tablet Take 1 tablet (10 mg total) by mouth every 6 (six) hours as needed for moderate pain. 05/22/20   Sharman Cheek, MD  Multiple Vitamin (MULTI-VITAMINS) TABS Take by mouth.    [provider]  naproxen (NAPROSYN) 500 MG tablet Take 1 tablet (500 mg total) by mouth 2 (two) times daily with a meal. 04/05/20   Bridget Hartshorn L, PA-C  ondansetron (ZOFRAN ODT) 4 MG disintegrating tablet Take 1 tablet (4 mg total) by mouth every 8 (eight) hours as needed for nausea or vomiting. 05/22/20   Sharman Cheek, MD  sildenafil (VIAGRA) 100 MG tablet Take 1 tablet (100 mg total) by mouth daily as needed for erectile dysfunction. Take two hours prior to intercourse on an empty stomach 07/22/19   McGowan, Carollee Herter A, PA-C  traMADol (ULTRAM) 50 MG tablet Take 1 tablet (50 mg total) by mouth every 6 (six) hours as needed. 04/05/20   Tommi Rumps, PA-C     Allergies Nitroglycerin   Family History  Problem Relation Age of Onset  . Heart attack Father        Initial MI age 86, deceased in his 35's from a massive MI  .  Heart attack Maternal Aunt 55  . Prostate cancer Maternal Grandfather   . Kidney cancer Neg Hx   . Bladder Cancer Neg Hx     Social History Social History   Tobacco Use  . Smoking status: Former Smoker    Packs/day: 1.00    Years: 20.00    Pack years: 20.00    Types: Cigarettes    Quit date: 01/24/2018    Years since quitting: 2.3  . Smokeless tobacco: Never Used  Vaping Use  . Vaping Use: Never used  Substance Use Topics  . Alcohol use: No  . Drug use: No    Review of Systems  Constitutional:   No fever or chills.  ENT:   No sore throat. No  rhinorrhea. Cardiovascular:   No chest pain or syncope. Respiratory:   No dyspnea or cough. Gastrointestinal:   Negative for abdominal pain, vomiting and diarrhea.  Musculoskeletal:   Positive flank pain All other systems reviewed and are negative except as documented above in ROS and HPI.  ____________________________________________   PHYSICAL EXAM:  VITAL SIGNS: ED Triage Vitals  Enc Vitals Group     BP 05/22/20 1332 (!) 141/98     Pulse Rate 05/22/20 1332 100     Resp 05/22/20 1332 20     Temp 05/22/20 1332 97.8 F (36.6 C)     Temp Source 05/22/20 1332 Oral     SpO2 05/22/20 1332 100 %     Weight 05/22/20 1333 220 lb (99.8 kg)     Height 05/22/20 1333 5\' 10"  (1.778 m)     Head Circumference --      Peak Flow --      Pain Score 05/22/20 1333 10     Pain Loc --      Pain Edu? --      Excl. in GC? --     Vital signs reviewed, nursing assessments reviewed.   Constitutional:   Alert and oriented. Non-toxic appearance. Eyes:   Conjunctivae are normal. EOMI. PERRL. ENT      Head:   Normocephalic and atraumatic.      Nose:   Wearing a mask.      Mouth/Throat:   Wearing a mask.      Neck:   No meningismus. Full ROM. Hematological/Lymphatic/Immunilogical:   No cervical lymphadenopathy. Cardiovascular:   RRR. Symmetric bilateral radial and DP pulses.  No murmurs. Cap refill less than 2 seconds. Respiratory:   Normal respiratory effort without tachypnea/retractions. Breath sounds are clear and equal bilaterally. No wheezes/rales/rhonchi. Gastrointestinal:   Soft and nontender. Non distended. There is no CVA tenderness.  No rebound, rigidity, or guarding.  Musculoskeletal:   Normal range of motion in all extremities. No joint effusions.  No lower extremity tenderness.  No edema. Neurologic:   Normal speech and language.  Motor grossly intact. No acute focal neurologic deficits are appreciated.  Skin:    Skin is warm, dry and intact. No rash noted.  No petechiae, purpura,  or bullae.  ____________________________________________    LABS (pertinent positives/negatives) (all labs ordered are listed, but only abnormal results are displayed) Labs Reviewed  URINALYSIS, COMPLETE (UACMP) WITH MICROSCOPIC - Abnormal; Notable for the following components:      Result Value   Color, Urine YELLOW (*)    APPearance CLEAR (*)    Hgb urine dipstick LARGE (*)    Protein, ur 30 (*)    RBC / HPF >50 (*)    All other components within normal limits  COMPREHENSIVE METABOLIC PANEL - Abnormal; Notable for the following components:   Glucose, Bld 118 (*)    BUN 22 (*)    Creatinine, Ser 1.32 (*)    Calcium 10.6 (*)    All other components within normal limits  CBC - Abnormal; Notable for the following components:   WBC 14.9 (*)    All other components within normal limits  LIPASE, BLOOD   ____________________________________________   EKG    ____________________________________________    RADIOLOGY  No results found.  ____________________________________________   PROCEDURES Procedures  ____________________________________________  DIFFERENTIAL DIAGNOSIS   Ureterolithiasis, diverticulitis, cystitis, musculoskeletal pain  CLINICAL IMPRESSION / ASSESSMENT AND PLAN / ED COURSE  Medications ordered in the ED: Medications  ondansetron (ZOFRAN) injection 4 mg (has no administration in time range)  ketorolac (TORADOL) 30 MG/ML injection 15 mg (15 mg Intravenous Given 05/22/20 1445)  ondansetron (ZOFRAN) injection 4 mg (4 mg Intravenous Given 05/22/20 1446)    Pertinent labs & imaging results that were available during my care of the patient were reviewed by me and considered in my medical decision making (see chart for details).  TYREIK DELAHOUSSAYE was evaluated in Emergency Department on 05/22/2020 for the symptoms described in the history of present illness. He was evaluated in the context of the global COVID-19 pandemic, which necessitated  consideration that the patient might be at risk for infection with the SARS-CoV-2 virus that causes COVID-19. Institutional protocols and algorithms that pertain to the evaluation of patients at risk for COVID-19 are in a state of rapid change based on information released by regulatory bodies including the CDC and federal and state organizations. These policies and algorithms were followed during the patient's care in the ED.   Patient presents with left flank pain, typical of renal colic.  He has microscopic hematuria on urinalysis without signs of infection.  Creatinine is well preserved.  Due to severity of pain, will obtain CT scan to ensure he does not have a large obstructing stone, plan for outpatient follow-up and symptom control.      ____________________________________________   FINAL CLINICAL IMPRESSION(S) / ED DIAGNOSES    Final diagnoses:  Ureterolithiasis     ED Discharge Orders         Ordered    ketorolac (TORADOL) 10 MG tablet  Every 6 hours PRN        05/22/20 1452    ondansetron (ZOFRAN ODT) 4 MG disintegrating tablet  Every 8 hours PRN        05/22/20 1452          Portions of this note were generated with dragon dictation software. Dictation errors may occur despite best attempts at proofreading.   Sharman Cheek, MD 05/22/20 1455

## 2020-05-23 ENCOUNTER — Encounter: Payer: Self-pay | Admitting: Internal Medicine

## 2020-05-23 LAB — BASIC METABOLIC PANEL
Anion gap: 7 (ref 5–15)
BUN: 20 mg/dL (ref 6–20)
CO2: 25 mmol/L (ref 22–32)
Calcium: 9.6 mg/dL (ref 8.9–10.3)
Chloride: 107 mmol/L (ref 98–111)
Creatinine, Ser: 1.07 mg/dL (ref 0.61–1.24)
GFR, Estimated: >60 mL/min (ref >60)
Glucose, Bld: 100 mg/dL — ABNORMAL HIGH (ref 70–99)
Potassium: 3.8 mmol/L (ref 3.5–5.1)
Sodium: 139 mmol/L (ref 135–145)

## 2020-05-23 MED ORDER — TAMSULOSIN HCL 0.4 MG PO CAPS
0.4000 mg | ORAL_CAPSULE | Freq: Every day | ORAL | Status: DC
  Administered 2020-05-23 – 2020-06-01 (×9): 0.4 mg via ORAL
  Filled 2020-05-23 (×9): qty 1

## 2020-05-23 MED ORDER — KETOROLAC TROMETHAMINE 30 MG/ML IJ SOLN
30.0000 mg | Freq: Four times a day (QID) | INTRAMUSCULAR | Status: DC | PRN
  Administered 2020-05-23 – 2020-05-24 (×3): 30 mg via INTRAVENOUS
  Filled 2020-05-23 (×3): qty 1

## 2020-05-23 MED ORDER — LABETALOL HCL 5 MG/ML IV SOLN
10.0000 mg | Freq: Once | INTRAVENOUS | Status: AC
  Administered 2020-05-23: 10 mg via INTRAVENOUS
  Filled 2020-05-23: qty 4

## 2020-05-23 NOTE — Plan of Care (Signed)
  Problem: Education: Goal: Knowledge of General Education information will improve Description: Including pain rating scale, medication(s)/side effects and non-pharmacologic comfort measures Outcome: Progressing   Problem: Clinical Measurements: Goal: Ability to maintain clinical measurements within normal limits will improve Outcome: Progressing Goal: Will remain free from infection Outcome: Progressing Goal: Diagnostic test results will improve Outcome: Progressing Goal: Respiratory complications will improve Outcome: Progressing Goal: Cardiovascular complication will be avoided Outcome: Progressing   Problem: Nutrition: Goal: Adequate nutrition will be maintained Outcome: Progressing   Problem: Coping: Goal: Level of anxiety will decrease Outcome: Progressing   Problem: Elimination: Goal: Will not experience complications related to bowel motility Outcome: Progressing Goal: Will not experience complications related to urinary retention Outcome: Progressing   

## 2020-05-23 NOTE — Progress Notes (Signed)
PROGRESS NOTE    Rickey Lane  MGQ:[id number] DOB: April 06, 1966 DOA: 05/22/2020 PCP: Margaretann Loveless, PA-C    Brief Narrative:  54 y.o. male with medical history significant for paroxysmal atrial fibrillation status post cardioversion in the past.  History of recurrent kidney stones in the past that required lithotripsy for treatment. Pt normally follows [facility], last seen by Michiel Cowboy, PA-C in 07/22/19. Pt was admitted for intractable pain requiring IV analgesic  Assessment & Plan:   Active Problems:   Kidney stones   #1.  Acute left flank pain secondary to multiple left kidney stones,  -CT personally reviewed. Pt with 2-3 stones noted in L kidney with associated hydronephrosis. Largest stone noted to be 32mm -Renal function remains normal -Overnight and this afternoon, patient is complaining of continued pain that is poorly controlled. Pt is continued on toradol and dilaudid for breakthrough pain -Will continue on flomax and hydration -Urology was initially consulted in ED with recommendations for conservative management with analgesia and hydration with no intervention planned -Pt with continued pain this afternoon requiring IV dilaudid -Will re-assess in AM. If pt remains symptomatic, would formally consult Urology while in hospital -Will repeat bmet in AM  #2. Mild AKI:  -Suspected prerenal + postobstructive in etiology.  -Continue hydration as tolerated -Repeat bmet in AM  #3.  Leukocytosis:  -Urinalysis not suggestive of UTI -Repeat CBC in AM   DVT prophylaxis: Lovenox subq Code Status: Full Family Communication: Pt in room, family not at bedside  Status is: Observation  The patient will require care spanning > 2 midnights and should be moved to inpatient because: Ongoing active pain requiring inpatient pain management  Dispo: The patient is from: Home              Anticipated d/c is to: Home              Anticipated d/c date is: 2  days              Patient currently is not medically stable to d/c.       Consultants:     Procedures:     Antimicrobials: Anti-infectives (From admission, onward)   None       Subjective: This AM and most of the day, pt reported no significant pain and ambulated well. In the afternoon, pt reported up to an 8/10 pain not improved with toradol and requiring IV dilaudid  Objective: Vitals:   05/23/20 0824 05/23/20 1008 05/23/20 1218 05/23/20 1515  BP: (!) 142/110 121/89 (!) 151/105 (!) 138/105  Pulse: 88 86 96 77  Resp: 17  19 16   Temp: 97.8 F (36.6 C) 97.9 F (36.6 C) 98.8 F (37.1 C) 98 F (36.7 C)  TempSrc: Oral Oral    SpO2: 98%  97% 100%  Weight:      Height:        Intake/Output Summary (Last 24 hours) at 05/23/2020 1625 Last data filed at 05/23/2020 1600 Gross per 24 hour  Intake 75 ml  Output 1550 ml  Net -1475 ml   Filed Weights   05/22/20 1333 05/23/20 0050  Weight: 99.8 kg 97.1 kg    Examination:  General exam: Appears calm and comfortable  Respiratory system: Clear to auscultation. Respiratory effort normal. Cardiovascular system: S1 & S2 heard, Regular Gastrointestinal system: Abdomen is nondistended, soft and nontender. No organomegaly or masses felt. Normal bowel sounds heard. Central nervous system: Alert and oriented. No focal neurological deficits. Extremities: Symmetric 5 x  5 power. Skin: No rashes, lesions  Psychiatry: Judgement and insight appear normal. Mood & affect appropriate.   Data Reviewed: I have personally reviewed following labs and imaging studies  CBC: Recent Labs  Lab 05/21/20 1605 05/22/20 1356  WBC 11.5* 14.9*  HGB 16.8 15.9  HCT 47.9 45.2  MCV 89.2 88.3  PLT 249 214   Basic Metabolic Panel: Recent Labs  Lab 05/21/20 1605 05/22/20 1356 05/23/20 0413  NA 136 141 139  K 3.9 3.6 3.8  CL 100 106 107  CO2 24 22 25   GLUCOSE 113* 118* 100*  BUN 23* 22* 20  CREATININE 1.46* 1.32* 1.07  CALCIUM 10.2  10.6* 9.6   GFR: Estimated Creatinine Clearance: 93.3 mL/min (by C-G formula based on SCr of 1.07 mg/dL). Liver Function Tests: Recent Labs  Lab 05/21/20 1605 05/22/20 1356  AST 29 27  ALT 36 34  ALKPHOS 68 64  BILITOT 1.2 1.2  PROT 8.1 7.7  ALBUMIN 4.8 4.5   Recent Labs  Lab 05/21/20 1605 05/22/20 1356  LIPASE 35 32   No results for input(s): AMMONIA in the last 168 hours. Coagulation Profile: No results for input(s): INR, PROTIME in the last 168 hours. Cardiac Enzymes: No results for input(s): CKTOTAL, CKMB, CKMBINDEX, TROPONINI in the last 168 hours. BNP (last 3 results) No results for input(s): PROBNP in the last 8760 hours. HbA1C: No results for input(s): HGBA1C in the last 72 hours. CBG: No results for input(s): GLUCAP in the last 168 hours. Lipid Profile: No results for input(s): CHOL, HDL, LDLCALC, TRIG, CHOLHDL, LDLDIRECT in the last 72 hours. Thyroid Function Tests: No results for input(s): TSH, T4TOTAL, FREET4, T3FREE, THYROIDAB in the last 72 hours. Anemia Panel: No results for input(s): VITAMINB12, FOLATE, FERRITIN, TIBC, IRON, RETICCTPCT in the last 72 hours. Sepsis Labs: No results for input(s): PROCALCITON, LATICACIDVEN in the last 168 hours.  Recent Results (from the past 240 hour(s))  Resp Panel by RT-PCR (Flu A&B, Covid) Nasopharyngeal Swab     Status: None   Collection Time: 05/22/20  9:33 PM   Specimen: Nasopharyngeal Swab; Nasopharyngeal(NP) swabs in vial transport medium  Result Value Ref Range Status   SARS Coronavirus 2 by RT PCR NEGATIVE NEGATIVE Final    Comment: (NOTE) SARS-CoV-2 target nucleic acids are NOT DETECTED.  The SARS-CoV-2 RNA is generally detectable in upper respiratory specimens during the acute phase of infection. The lowest concentration of SARS-CoV-2 viral copies this assay can detect is 138 copies/mL. A negative result does not preclude SARS-Cov-2 infection and should not be used as the sole basis for treatment  or other patient management decisions. A negative result may occur with  improper specimen collection/handling, submission of specimen other than nasopharyngeal swab, presence of viral mutation(s) within the areas targeted by this assay, and inadequate number of viral copies(<138 copies/mL). A negative result must be combined with clinical observations, patient history, and epidemiological information. The expected result is Negative.  Fact Sheet for Patients:  05/24/20  Fact Sheet for Healthcare Providers:  BloggerCourse.com  This test is no t yet approved or cleared by the [place] FDA and  has been authorized for detection and/or diagnosis of SARS-CoV-2 by FDA under an Emergency Use Authorization (EUA). This EUA will remain  in effect (meaning this test can be used) for the duration of the COVID-19 declaration under Section 564(b)(1) of the Act, 21 U.S.C.section 360bbb-3(b)(1), unless the authorization is terminated  or revoked sooner.       Influenza A by  PCR NEGATIVE NEGATIVE Final   Influenza B by PCR NEGATIVE NEGATIVE Final    Comment: (NOTE) The Xpert Xpress SARS-CoV-2/FLU/RSV plus assay is intended as an aid in the diagnosis of influenza from Nasopharyngeal swab specimens and should not be used as a sole basis for treatment. Nasal washings and aspirates are unacceptable for Xpert Xpress SARS-CoV-2/FLU/RSV testing.  Fact Sheet for Patients: BloggerCourse.com  Fact Sheet for Healthcare Providers: SeriousBroker.it  This test is not yet approved or cleared by the [place] FDA and has been authorized for detection and/or diagnosis of SARS-CoV-2 by FDA under an Emergency Use Authorization (EUA). This EUA will remain in effect (meaning this test can be used) for the duration of the COVID-19 declaration under Section 564(b)(1) of the Act, 21 U.S.C. section  360bbb-3(b)(1), unless the authorization is terminated or revoked.  Performed at [facility], [address], [place], [place] [address]      Radiology Studies: CT Renal Stone Study  Result Date: 05/22/2020 CLINICAL DATA:  Sudden onset of left back and flank pain. EXAM: CT ABDOMEN AND PELVIS WITHOUT CONTRAST TECHNIQUE: Multidetector CT imaging of the abdomen and pelvis was performed following the standard protocol without IV contrast. COMPARISON:  May 08, 2020 FINDINGS: Lower chest: No acute abnormality. Hepatobiliary: No focal liver abnormality is seen. No gallstones, gallbladder wall thickening, or biliary dilatation. Pancreas: Unremarkable. No pancreatic ductal dilatation or surrounding inflammatory changes. Spleen: Normal in size without focal abnormality. Adrenals/Urinary Tract: Normal adrenal glands. Tiny nonobstructive calculus, 1-2 mm in the midpolar region of right kidney. A collection of 213 ureteral stones in the proximal left ureter, slightly past the UPJ, the largest calculus measures 9 mm. Subsequent obstructive uropathy of the left kidney with moderate left hydronephrosis and proximal hydroureter. Additional nonobstructive 1-2 mm calculus in the upper pole of the left kidney. Normal appearance of the urinary bladder. Stomach/Bowel: Stomach is within normal limits. No evidence of appendicitis. No evidence of bowel wall thickening, distention, or inflammatory changes. Vascular/Lymphatic: No significant vascular findings are present. No enlarged abdominal or pelvic lymph nodes. Reproductive: Prostate is unremarkable. Other: No abdominal wall hernia or abnormality. No abdominopelvic ascites. Musculoskeletal: Spondylosis of the thoracolumbar spine. IMPRESSION: 1. A collection of 2-3 ureteral stones in the proximal left ureter, slightly past the UPJ, with the largest calculus measuring 9 mm. Subsequent obstructive uropathy of the left kidney with moderate left hydronephrosis and  proximal hydroureter. 2. Additional tiny nonobstructive bilateral renal calculi. Electronically Signed   By: Ted Mcalpine M.D.   On: 05/22/2020 15:13    Scheduled Meds: . enoxaparin (LOVENOX) injection  0.5 mg/kg Subcutaneous Q24H  . fentaNYL (SUBLIMAZE) injection  50 mcg Intravenous Once  . tamsulosin  0.4 mg Oral Daily   Continuous Infusions: . sodium chloride 75 mL/hr at 05/23/20 1316     LOS: 0 days   Rickey Barbara, MD [facility] Pager On Amion  If 7PM-7AM, please contact night-coverage 05/23/2020, 4:25 PM

## 2020-05-24 ENCOUNTER — Inpatient Hospital Stay: Payer: Self-pay

## 2020-05-24 DIAGNOSIS — N200 Calculus of kidney: Secondary | ICD-10-CM

## 2020-05-24 LAB — COMPREHENSIVE METABOLIC PANEL
ALT: 27 U/L (ref 0–44)
AST: 22 U/L (ref 15–41)
Albumin: 4 g/dL (ref 3.5–5.0)
Alkaline Phosphatase: 54 U/L (ref 38–126)
Anion gap: 8 (ref 5–15)
BUN: 16 mg/dL (ref 6–20)
CO2: 28 mmol/L (ref 22–32)
Calcium: 9.4 mg/dL (ref 8.9–10.3)
Chloride: 105 mmol/L (ref 98–111)
Creatinine, Ser: 0.84 mg/dL (ref 0.61–1.24)
GFR, Estimated: >60 mL/min (ref >60)
Glucose, Bld: 105 mg/dL — ABNORMAL HIGH (ref 70–99)
Potassium: 4 mmol/L (ref 3.5–5.1)
Sodium: 141 mmol/L (ref 135–145)
Total Bilirubin: 0.9 mg/dL (ref 0.3–1.2)
Total Protein: 7.1 g/dL (ref 6.5–8.1)

## 2020-05-24 LAB — CBC
HCT: 45.4 % (ref 39.0–52.0)
Hemoglobin: 15.3 g/dL (ref 13.0–17.0)
MCH: 30.9 pg (ref 26.0–34.0)
MCHC: 33.7 g/dL (ref 30.0–36.0)
MCV: 91.7 fL (ref 80.0–100.0)
Platelets: 183 10*3/uL (ref 150–400)
RBC: 4.95 MIL/uL (ref 4.22–5.81)
RDW: 12.3 % (ref 11.5–15.5)
WBC: 8 10*3/uL (ref 4.0–10.5)
nRBC: 0 % (ref 0.0–0.2)

## 2020-05-24 LAB — HIV ANTIBODY (ROUTINE TESTING W REFLEX): HIV Screen 4th Generation wRfx: NONREACTIVE

## 2020-05-24 MED ORDER — SODIUM CHLORIDE 0.9 % IV BOLUS
500.0000 mL | Freq: Once | INTRAVENOUS | Status: AC
  Administered 2020-05-24: 500 mL via INTRAVENOUS

## 2020-05-24 MED ORDER — FENTANYL CITRATE (PF) 100 MCG/2ML IJ SOLN
INTRAMUSCULAR | Status: AC
  Administered 2020-05-24: 50 ug
  Filled 2020-05-24: qty 2

## 2020-05-24 MED ORDER — TRAMADOL HCL 50 MG PO TABS
50.0000 mg | ORAL_TABLET | Freq: Four times a day (QID) | ORAL | Status: DC | PRN
  Administered 2020-05-24 – 2020-05-31 (×13): 50 mg via ORAL
  Filled 2020-05-24 (×13): qty 1

## 2020-05-24 MED ORDER — FENTANYL CITRATE (PF) 100 MCG/2ML IJ SOLN
50.0000 ug | Freq: Once | INTRAMUSCULAR | Status: AC
  Administered 2020-05-24: 50 ug via INTRAVENOUS

## 2020-05-24 MED ORDER — FENTANYL CITRATE (PF) 100 MCG/2ML IJ SOLN
50.0000 ug | Freq: Once | INTRAMUSCULAR | Status: AC
  Filled 2020-05-24: qty 2

## 2020-05-24 NOTE — Consult Note (Signed)
Urology Consult  I have been asked to see the patient by Dr. Rhona Leavens, for evaluation and management of left renal colic.  Chief Complaint: Left flank pain, nausea  History of Present Illness: Rickey Lane is a 54 y.o. year old male with PMH A. fib, OSA, and nephrolithiasis admitted on 05/22/2020 for management of renal colic secondary to 2-3 left proximal ureteral stones, largest measuring 9 mm.  He was also noted to have an AKI at the time of admission with creatinine 1.46.  He is well-known to our clinic, having previously been followed by Michiel Cowboy for a 4 mm left upper pole stone and BPH with LUTS.  Urology was initially consulted on this case, however elected to defer urgent intervention given adequate pain control.  We were reconsulted due to poorly controlled pain requiring IV Dilaudid.  Admission UA notable only for microscopic hematuria, no bacteria, nitrites, or leukocytes.  Creatinine has normalized today, 0.84.  Patient is comfortable appearing today.  He reports intermittent 8/10 left flank pain, improved to 5/10 in severity with pain medication. He states overall his pain is becoming more tolerable.  He denies fever, chills, nausea, vomiting, and gross hematuria today.  He states he previously underwent shockwave lithotripsy, year unknown, believed on the right side; he recalls tolerating this well.  Past Medical History:  Diagnosis Date  . Atrial fibrillation (HCC)    a. initially diagnosed in 09/2015. Reported a history of "irregular HR" for 3+ years  . Dysrhythmia   . GERD (gastroesophageal reflux disease)   . Headache   . Kidney stone   . Sleep apnea     Past Surgical History:  Procedure Laterality Date  . ELECTROPHYSIOLOGIC STUDY N/A 11/04/2015   Procedure: CARDIOVERSION;  Surgeon: Iran Ouch, MD;  Location: [facility] ORS;  Service: Cardiovascular;  Laterality: N/A;  . lithotripsy      Home Medications:  Current Meds  Medication Sig  .  acetaminophen (TYLENOL) 500 MG tablet Take 500-1,000 mg by mouth every 6 (six) hours as needed for mild pain or moderate pain.   . famotidine (PEPCID) 20 MG tablet Take 20 mg by mouth 2 (two) times daily as needed for heartburn or indigestion.  . Garlic 500 MG CAPS Take 1 capsule by mouth as directed.  . Ginger 500 MG CAPS Take 1 capsule by mouth daily.  . Glucosamine-Chondroitin (OSTEO BI-FLEX REGULAR STRENGTH) 250-200 MG TABS Take 1 tablet by mouth as directed.  . Multiple Vitamin (MULTI-VITAMINS) TABS Take 1 tablet by mouth daily.   . sildenafil (VIAGRA) 100 MG tablet Take 1 tablet (100 mg total) by mouth daily as needed for erectile dysfunction. Take two hours prior to intercourse on an empty stomach  . zinc sulfate 220 (50 Zn) MG capsule Take 220 mg by mouth daily.    Allergies:  Allergies  Allergen Reactions  . Nitroglycerin Other (See Comments)    "Shivering" and "decreased heart rate"    Family History  Problem Relation Age of Onset  . Heart attack Father        Initial MI age 38, deceased in his 33's from a massive MI  . Heart attack Maternal Aunt 55  . Prostate cancer Maternal Grandfather   . Kidney cancer Neg Hx   . Bladder Cancer Neg Hx     Social History:  reports that he quit smoking about 2 years ago. His smoking use included cigarettes. He has a 20.00 pack-year smoking history. He has never used smokeless  tobacco. He reports that he does not drink alcohol and does not use drugs.  ROS: A complete review of systems was performed.  All systems are negative except for pertinent findings as noted.  Physical Exam:  Vital signs in last 24 hours: Temp:  [97.6 F (36.4 C)-98.7 F (37.1 C)] 98.3 F (36.8 C) (11/29 1140) Pulse Rate:  [77-96] 94 (11/29 1140) Resp:  [16-20] 17 (11/29 1140) BP: (133-155)/(93-114) 155/114 (11/29 1140) SpO2:  [96 %-100 %] 100 % (11/29 1140) Constitutional:  Alert and oriented, no acute distress HEENT: [place] AT, moist mucus  membranes Cardiovascular: No clubbing, cyanosis, or edema Respiratory: Normal respiratory effort Skin: No rashes, bruises or suspicious lesions Neurologic: Grossly intact, no focal deficits, moving all 4 extremities Psychiatric: Normal mood and affect  Laboratory Data:  Recent Labs    05/21/20 1605 05/22/20 1356 05/24/20 0520  WBC 11.5* 14.9* 8.0  HGB 16.8 15.9 15.3  HCT 47.9 45.2 45.4   Recent Labs    05/22/20 1356 05/23/20 0413 05/24/20 0520  NA 141 139 141  K 3.6 3.8 4.0  CL 106 107 105  CO2 22 25 28   GLUCOSE 118* 100* 105*  BUN 22* 20 16  CREATININE 1.32* 1.07 0.84  CALCIUM 10.6* 9.6 9.4   Urinalysis    Component Value Date/Time   COLORURINE YELLOW (A) 05/22/2020 1337   APPEARANCEUR CLEAR (A) 05/22/2020 1337   APPEARANCEUR Clear 07/22/2019 1432   LABSPEC 1.026 05/22/2020 1337   LABSPEC 1.019 07/30/2013 2022   PHURINE 5.0 05/22/2020 1337   GLUCOSEU NEGATIVE 05/22/2020 1337   GLUCOSEU 150 mg/dL 14/78/295602/09/2013 21302022   HGBUR LARGE (A) 05/22/2020 1337   BILIRUBINUR NEGATIVE 05/22/2020 1337   BILIRUBINUR Negative 07/22/2019 1432   BILIRUBINUR Negative 07/30/2013 2022   KETONESUR NEGATIVE 05/22/2020 1337   PROTEINUR 30 (A) 05/22/2020 1337   NITRITE NEGATIVE 05/22/2020 1337   LEUKOCYTESUR NEGATIVE 05/22/2020 1337   LEUKOCYTESUR Negative 07/30/2013 2022   Results for orders placed or performed during the hospital encounter of 05/22/20  Resp Panel by RT-PCR (Flu A&B, Covid) Nasopharyngeal Swab     Status: None   Collection Time: 05/22/20  9:33 PM   Specimen: Nasopharyngeal Swab; Nasopharyngeal(NP) swabs in vial transport medium  Result Value Ref Range Status   SARS Coronavirus 2 by RT PCR NEGATIVE NEGATIVE Final    Comment: (NOTE) SARS-CoV-2 target nucleic acids are NOT DETECTED.  The SARS-CoV-2 RNA is generally detectable in upper respiratory specimens during the acute phase of infection. The lowest concentration of SARS-CoV-2 viral copies this assay can detect  is 138 copies/mL. A negative result does not preclude SARS-Cov-2 infection and should not be used as the sole basis for treatment or other patient management decisions. A negative result may occur with  improper specimen collection/handling, submission of specimen other than nasopharyngeal swab, presence of viral mutation(s) within the areas targeted by this assay, and inadequate number of viral copies(<138 copies/mL). A negative result must be combined with clinical observations, patient history, and epidemiological information. The expected result is Negative.  Fact Sheet for Patients:  BloggerCourse.comhttps://www.fda.gov/media/152166/download  Fact Sheet for Healthcare Providers:  SeriousBroker.ithttps://www.fda.gov/media/152162/download  This test is no t yet approved or cleared by the [place] FDA and  has been authorized for detection and/or diagnosis of SARS-CoV-2 by FDA under an Emergency Use Authorization (EUA). This EUA will remain  in effect (meaning this test can be used) for the duration of the COVID-19 declaration under Section 564(b)(1) of the Act, 21 U.S.C.section 360bbb-3(b)(1), unless the  authorization is terminated  or revoked sooner.       Influenza A by PCR NEGATIVE NEGATIVE Final   Influenza B by PCR NEGATIVE NEGATIVE Final    Comment: (NOTE) The Xpert Xpress SARS-CoV-2/FLU/RSV plus assay is intended as an aid in the diagnosis of influenza from Nasopharyngeal swab specimens and should not be used as a sole basis for treatment. Nasal washings and aspirates are unacceptable for Xpert Xpress SARS-CoV-2/FLU/RSV testing.  Fact Sheet for Patients: BloggerCourse.com  Fact Sheet for Healthcare Providers: SeriousBroker.it  This test is not yet approved or cleared by the [place] FDA and has been authorized for detection and/or diagnosis of SARS-CoV-2 by FDA under an Emergency Use Authorization (EUA). This EUA will remain in effect  (meaning this test can be used) for the duration of the COVID-19 declaration under Section 564(b)(1) of the Act, 21 U.S.C. section 360bbb-3(b)(1), unless the authorization is terminated or revoked.  Performed at [facility], [address], [place], [place] [address]     Radiologic Imaging: CT Renal Stone Study  Result Date: 05/22/2020 CLINICAL DATA:  Sudden onset of left back and flank pain. EXAM: CT ABDOMEN AND PELVIS WITHOUT CONTRAST TECHNIQUE: Multidetector CT imaging of the abdomen and pelvis was performed following the standard protocol without IV contrast. COMPARISON:  May 08, 2020 FINDINGS: Lower chest: No acute abnormality. Hepatobiliary: No focal liver abnormality is seen. No gallstones, gallbladder wall thickening, or biliary dilatation. Pancreas: Unremarkable. No pancreatic ductal dilatation or surrounding inflammatory changes. Spleen: Normal in size without focal abnormality. Adrenals/Urinary Tract: Normal adrenal glands. Tiny nonobstructive calculus, 1-2 mm in the midpolar region of right kidney. A collection of 213 ureteral stones in the proximal left ureter, slightly past the UPJ, the largest calculus measures 9 mm. Subsequent obstructive uropathy of the left kidney with moderate left hydronephrosis and proximal hydroureter. Additional nonobstructive 1-2 mm calculus in the upper pole of the left kidney. Normal appearance of the urinary bladder. Stomach/Bowel: Stomach is within normal limits. No evidence of appendicitis. No evidence of bowel wall thickening, distention, or inflammatory changes. Vascular/Lymphatic: No significant vascular findings are present. No enlarged abdominal or pelvic lymph nodes. Reproductive: Prostate is unremarkable. Other: No abdominal wall hernia or abnormality. No abdominopelvic ascites. Musculoskeletal: Spondylosis of the thoracolumbar spine. IMPRESSION: 1. A collection of 2-3 ureteral stones in the proximal left ureter, slightly past the  UPJ, with the largest calculus measuring 9 mm. Subsequent obstructive uropathy of the left kidney with moderate left hydronephrosis and proximal hydroureter. 2. Additional tiny nonobstructive bilateral renal calculi. Electronically Signed   By: Ted Mcalpine M.D.   On: 05/22/2020 15:13   Assessment & Plan:  54 year old male admitted with renal colic secondary to 2-3 proximal left ureteral stones with AKI, now resolved.  Hospitalization has been prolonged due to inadequate pain control, however pain is improving per patient report today.  With normalized creatinine and tolerable pain, no indication for urgent ureteral stenting.  Patient may have diet.  We discussed various treatment options for his stones including trial of passage vs. ESWL vs. ureteroscopy with laser lithotripsy and stent. We discussed the risks and benefits of each including bleeding, infection, damage to surrounding structures, efficacy with need for possible further intervention, and need for temporary ureteral stent.  I explained that he is likely a poor candidate for ESWL given the multiplicity of his stones.  Given this, I recommend ureteroscopy for definitive management.  Ideally, this could be performed on an outpatient basis, however if pain worsens and  he continues to require IV pain medication, we may need to add him on this week.  Case to be discussed further with Dr. Apolinar Junes regarding scheduling.  Thank you for involving me in this patient's care, I will continue to follow along.  Carman Ching, PA-C 05/24/2020 12:59 PM

## 2020-05-24 NOTE — H&P (View-Only) (Signed)
   Urology Consult  I have been asked to see the patient by Dr. Chiu, for evaluation and management of left renal colic.  Chief Complaint: Left flank pain, nausea  History of Present Illness: Rickey Lane is a 54 y.o. year old male with PMH A. fib, OSA, and nephrolithiasis admitted on 05/22/2020 for management of renal colic secondary to 2-3 left proximal ureteral stones, largest measuring 9 mm.  He was also noted to have an AKI at the time of admission with creatinine 1.46.  He is well-known to our clinic, having previously been followed by Shannon McGowan for a 4 mm left upper pole stone and BPH with LUTS.  Urology was initially consulted on this case, however elected to defer urgent intervention given adequate pain control.  We were reconsulted due to poorly controlled pain requiring IV Dilaudid.  Admission UA notable only for microscopic hematuria, no bacteria, nitrites, or leukocytes.  Creatinine has normalized today, 0.84.  Patient is comfortable appearing today.  He reports intermittent 8/10 left flank pain, improved to 5/10 in severity with pain medication. He states overall his pain is becoming more tolerable.  He denies fever, chills, nausea, vomiting, and gross hematuria today.  He states he previously underwent shockwave lithotripsy, year unknown, believed on the right side; he recalls tolerating this well.  Past Medical History:  Diagnosis Date  . Atrial fibrillation (HCC)    a. initially diagnosed in 09/2015. Reported a history of "irregular HR" for 3+ years  . Dysrhythmia   . GERD (gastroesophageal reflux disease)   . Headache   . Kidney stone   . Sleep apnea     Past Surgical History:  Procedure Laterality Date  . ELECTROPHYSIOLOGIC STUDY N/A 11/04/2015   Procedure: CARDIOVERSION;  Surgeon: Muhammad A Arida, MD;  Location: [facility] ORS;  Service: Cardiovascular;  Laterality: N/A;  . lithotripsy      Home Medications:  Current Meds  Medication Sig  .  acetaminophen (TYLENOL) 500 MG tablet Take 500-1,000 mg by mouth every 6 (six) hours as needed for mild pain or moderate pain.   . famotidine (PEPCID) 20 MG tablet Take 20 mg by mouth 2 (two) times daily as needed for heartburn or indigestion.  . Garlic 500 MG CAPS Take 1 capsule by mouth as directed.  . Ginger 500 MG CAPS Take 1 capsule by mouth daily.  . Glucosamine-Chondroitin (OSTEO BI-FLEX REGULAR STRENGTH) 250-200 MG TABS Take 1 tablet by mouth as directed.  . Multiple Vitamin (MULTI-VITAMINS) TABS Take 1 tablet by mouth daily.   . sildenafil (VIAGRA) 100 MG tablet Take 1 tablet (100 mg total) by mouth daily as needed for erectile dysfunction. Take two hours prior to intercourse on an empty stomach  . zinc sulfate 220 (50 Zn) MG capsule Take 220 mg by mouth daily.    Allergies:  Allergies  Allergen Reactions  . Nitroglycerin Other (See Comments)    "Shivering" and "decreased heart rate"    Family History  Problem Relation Age of Onset  . Heart attack Father        Initial MI age 30, deceased in his 60's from a massive MI  . Heart attack Maternal Aunt 55  . Prostate cancer Maternal Grandfather   . Kidney cancer Neg Hx   . Bladder Cancer Neg Hx     Social History:  reports that he quit smoking about 2 years ago. His smoking use included cigarettes. He has a 20.00 pack-year smoking history. He has never used smokeless   tobacco. He reports that he does not drink alcohol and does not use drugs.  ROS: A complete review of systems was performed.  All systems are negative except for pertinent findings as noted.  Physical Exam:  Vital signs in last 24 hours: Temp:  [97.6 F (36.4 C)-98.7 F (37.1 C)] 98.3 F (36.8 C) (11/29 1140) Pulse Rate:  [77-96] 94 (11/29 1140) Resp:  [16-20] 17 (11/29 1140) BP: (133-155)/(93-114) 155/114 (11/29 1140) SpO2:  [96 %-100 %] 100 % (11/29 1140) Constitutional:  Alert and oriented, no acute distress HEENT: [place] AT, moist mucus  membranes Cardiovascular: No clubbing, cyanosis, or edema Respiratory: Normal respiratory effort Skin: No rashes, bruises or suspicious lesions Neurologic: Grossly intact, no focal deficits, moving all 4 extremities Psychiatric: Normal mood and affect  Laboratory Data:  Recent Labs    05/21/20 1605 05/22/20 1356 05/24/20 0520  WBC 11.5* 14.9* 8.0  HGB 16.8 15.9 15.3  HCT 47.9 45.2 45.4   Recent Labs    05/22/20 1356 05/23/20 0413 05/24/20 0520  NA 141 139 141  K 3.6 3.8 4.0  CL 106 107 105  CO2 22 25 28  GLUCOSE 118* 100* 105*  BUN 22* 20 16  CREATININE 1.32* 1.07 0.84  CALCIUM 10.6* 9.6 9.4   Urinalysis    Component Value Date/Time   COLORURINE YELLOW (A) 05/22/2020 1337   APPEARANCEUR CLEAR (A) 05/22/2020 1337   APPEARANCEUR Clear 07/22/2019 1432   LABSPEC 1.026 05/22/2020 1337   LABSPEC 1.019 07/30/2013 2022   PHURINE 5.0 05/22/2020 1337   GLUCOSEU NEGATIVE 05/22/2020 1337   GLUCOSEU 150 mg/dL 07/30/2013 2022   HGBUR LARGE (A) 05/22/2020 1337   BILIRUBINUR NEGATIVE 05/22/2020 1337   BILIRUBINUR Negative 07/22/2019 1432   BILIRUBINUR Negative 07/30/2013 2022   KETONESUR NEGATIVE 05/22/2020 1337   PROTEINUR 30 (A) 05/22/2020 1337   NITRITE NEGATIVE 05/22/2020 1337   LEUKOCYTESUR NEGATIVE 05/22/2020 1337   LEUKOCYTESUR Negative 07/30/2013 2022   Results for orders placed or performed during the hospital encounter of 05/22/20  Resp Panel by RT-PCR (Flu A&B, Covid) Nasopharyngeal Swab     Status: None   Collection Time: 05/22/20  9:33 PM   Specimen: Nasopharyngeal Swab; Nasopharyngeal(NP) swabs in vial transport medium  Result Value Ref Range Status   SARS Coronavirus 2 by RT PCR NEGATIVE NEGATIVE Final    Comment: (NOTE) SARS-CoV-2 target nucleic acids are NOT DETECTED.  The SARS-CoV-2 RNA is generally detectable in upper respiratory specimens during the acute phase of infection. The lowest concentration of SARS-CoV-2 viral copies this assay can detect  is 138 copies/mL. A negative result does not preclude SARS-Cov-2 infection and should not be used as the sole basis for treatment or other patient management decisions. A negative result may occur with  improper specimen collection/handling, submission of specimen other than nasopharyngeal swab, presence of viral mutation(s) within the areas targeted by this assay, and inadequate number of viral copies(<138 copies/mL). A negative result must be combined with clinical observations, patient history, and epidemiological information. The expected result is Negative.  Fact Sheet for Patients:  https://www.fda.gov/media/152166/download  Fact Sheet for Healthcare Providers:  https://www.fda.gov/media/152162/download  This test is no t yet approved or cleared by the [place] FDA and  has been authorized for detection and/or diagnosis of SARS-CoV-2 by FDA under an Emergency Use Authorization (EUA). This EUA will remain  in effect (meaning this test can be used) for the duration of the COVID-19 declaration under Section 564(b)(1) of the Act, 21 U.S.C.section 360bbb-3(b)(1), unless the   authorization is terminated  or revoked sooner.       Influenza A by PCR NEGATIVE NEGATIVE Final   Influenza B by PCR NEGATIVE NEGATIVE Final    Comment: (NOTE) The Xpert Xpress SARS-CoV-2/FLU/RSV plus assay is intended as an aid in the diagnosis of influenza from Nasopharyngeal swab specimens and should not be used as a sole basis for treatment. Nasal washings and aspirates are unacceptable for Xpert Xpress SARS-CoV-2/FLU/RSV testing.  Fact Sheet for Patients: https://www.fda.gov/media/152166/download  Fact Sheet for Healthcare Providers: https://www.fda.gov/media/152162/download  This test is not yet approved or cleared by the [place] FDA and has been authorized for detection and/or diagnosis of SARS-CoV-2 by FDA under an Emergency Use Authorization (EUA). This EUA will remain in effect  (meaning this test can be used) for the duration of the COVID-19 declaration under Section 564(b)(1) of the Act, 21 U.S.C. section 360bbb-3(b)(1), unless the authorization is terminated or revoked.  Performed at [facility], [address] [place], [place] [address]     Radiologic Imaging: CT Renal Stone Study  Result Date: 05/22/2020 CLINICAL DATA:  Sudden onset of left back and flank pain. EXAM: CT ABDOMEN AND PELVIS WITHOUT CONTRAST TECHNIQUE: Multidetector CT imaging of the abdomen and pelvis was performed following the standard protocol without IV contrast. COMPARISON:  May 08, 2020 FINDINGS: Lower chest: No acute abnormality. Hepatobiliary: No focal liver abnormality is seen. No gallstones, gallbladder wall thickening, or biliary dilatation. Pancreas: Unremarkable. No pancreatic ductal dilatation or surrounding inflammatory changes. Spleen: Normal in size without focal abnormality. Adrenals/Urinary Tract: Normal adrenal glands. Tiny nonobstructive calculus, 1-2 mm in the midpolar region of right kidney. A collection of 213 ureteral stones in the proximal left ureter, slightly past the UPJ, the largest calculus measures 9 mm. Subsequent obstructive uropathy of the left kidney with moderate left hydronephrosis and proximal hydroureter. Additional nonobstructive 1-2 mm calculus in the upper pole of the left kidney. Normal appearance of the urinary bladder. Stomach/Bowel: Stomach is within normal limits. No evidence of appendicitis. No evidence of bowel wall thickening, distention, or inflammatory changes. Vascular/Lymphatic: No significant vascular findings are present. No enlarged abdominal or pelvic lymph nodes. Reproductive: Prostate is unremarkable. Other: No abdominal wall hernia or abnormality. No abdominopelvic ascites. Musculoskeletal: Spondylosis of the thoracolumbar spine. IMPRESSION: 1. A collection of 2-3 ureteral stones in the proximal left ureter, slightly past the  UPJ, with the largest calculus measuring 9 mm. Subsequent obstructive uropathy of the left kidney with moderate left hydronephrosis and proximal hydroureter. 2. Additional tiny nonobstructive bilateral renal calculi. Electronically Signed   By: Dobrinka  Dimitrova M.D.   On: 05/22/2020 15:13   Assessment & Plan:  54-year-old male admitted with renal colic secondary to 2-3 proximal left ureteral stones with AKI, now resolved.  Hospitalization has been prolonged due to inadequate pain control, however pain is improving per patient report today.  With normalized creatinine and tolerable pain, no indication for urgent ureteral stenting.  Patient may have diet.  We discussed various treatment options for his stones including trial of passage vs. ESWL vs. ureteroscopy with laser lithotripsy and stent. We discussed the risks and benefits of each including bleeding, infection, damage to surrounding structures, efficacy with need for possible further intervention, and need for temporary ureteral stent.  I explained that he is likely a poor candidate for ESWL given the multiplicity of his stones.  Given this, I recommend ureteroscopy for definitive management.  Ideally, this could be performed on an outpatient basis, however if pain worsens and   he continues to require IV pain medication, we may need to add him on this week.  Case to be discussed further with Dr. Brandon regarding scheduling.  Thank you for involving me in this patient's care, I will continue to follow along.  Chip Canepa, PA-C 05/24/2020 12:59 PM   

## 2020-05-24 NOTE — Progress Notes (Signed)
PROGRESS NOTE    Rickey Lane  XTG:[id number] DOB: 1966-03-05 DOA: 05/22/2020 PCP: Margaretann Loveless, PA-C    Brief Narrative:  54 y.o. male with medical history significant for paroxysmal atrial fibrillation status post cardioversion in the past.  History of recurrent kidney stones in the past that required lithotripsy for treatment. Pt normally follows [facility], last seen by Michiel Cowboy, PA-C in 07/22/19. Pt was admitted for intractable pain requiring IV analgesic  Assessment & Plan:   Active Problems:   Kidney stones   #1.  Acute left flank pain secondary to multiple left kidney stones,  -CT personally reviewed. Pt with 2-3 stones noted in L kidney with associated hydronephrosis. Largest stone noted to be 90mm -Renal function remains normal -Overnight and this afternoon, patient is complaining of continued pain that is poorly controlled. Pt is continued on toradol and dilaudid for breakthrough pain -Will continue on flomax and hydration -Urology was initially consulted in ED with recommendations for conservative management with analgesia and hydration with no intervention planned -Pt had recurrent pain overnight requiring IV dialudid -Formally consulted Urology, appreciate recs  #2. Mild AKI:  -Suspected prerenal + postobstructive in etiology.  -resolved with IVF hydration -Cont to follow renal function  #3.  Leukocytosis:  -Urinalysis not suggestive of UTI -WBC normalized   DVT prophylaxis: Lovenox subq Code Status: Full Family Communication: Pt in room, family not at bedside  Status is: Observation  The patient will require care spanning > 2 midnights and should be moved to inpatient because: Ongoing active pain requiring inpatient pain management  Dispo: The patient is from: Home              Anticipated d/c is to: Home              Anticipated d/c date is: 2 days              Patient currently is not medically stable to d/c.      Consultants:   Urology  Procedures:     Antimicrobials: Anti-infectives (From admission, onward)   None      Subjective: States still having mild persistent L flank pain.  Objective: Vitals:   05/24/20 0130 05/24/20 0352 05/24/20 0806 05/24/20 1140  BP: (!) 133/106 (!) 152/97 (!) 152/112 (!) 155/114  Pulse: 96 83 88 94  Resp: 20 17 18 17   Temp: 98.7 F (37.1 C) 97.6 F (36.4 C) 98.3 F (36.8 C) 98.3 F (36.8 C)  TempSrc:   Oral Oral  SpO2: 96% 96% 100% 100%  Weight:      Height:        Intake/Output Summary (Last 24 hours) at 05/24/2020 1325 Last data filed at 05/24/2020 05/26/2020 Gross per 24 hour  Intake 850.77 ml  Output 1825 ml  Net -974.23 ml   Filed Weights   05/22/20 1333 05/23/20 0050  Weight: 99.8 kg 97.1 kg    Examination: General exam: Awake, laying in bed, in nad Respiratory system: Normal respiratory effort, no wheezing Cardiovascular system: regular rate, s1, s2 Gastrointestinal system: Soft, nondistended, positive BS Central nervous system: CN2-12 grossly intact, strength intact Extremities: Perfused, no clubbing Skin: Normal skin turgor, no notable skin lesions seen Psychiatry: Mood normal // no visual hallucinations   Data Reviewed: I have personally reviewed following labs and imaging studies  CBC: Recent Labs  Lab 05/21/20 1605 05/22/20 1356 05/24/20 0520  WBC 11.5* 14.9* 8.0  HGB 16.8 15.9 15.3  HCT 47.9 45.2 45.4  MCV 89.2  88.3 91.7  PLT 249 214 183   Basic Metabolic Panel: Recent Labs  Lab 05/21/20 1605 05/22/20 1356 05/23/20 0413 05/24/20 0520  NA 136 141 139 141  K 3.9 3.6 3.8 4.0  CL 100 106 107 105  CO2 24 22 25 28   GLUCOSE 113* 118* 100* 105*  BUN 23* 22* 20 16  CREATININE 1.46* 1.32* 1.07 0.84  CALCIUM 10.2 10.6* 9.6 9.4   GFR: Estimated Creatinine Clearance: 118.8 mL/min (by C-G formula based on SCr of 0.84 mg/dL). Liver Function Tests: Recent Labs  Lab 05/21/20 1605 05/22/20 1356 05/24/20 0520   AST 29 27 22   ALT 36 34 27  ALKPHOS 68 64 54  BILITOT 1.2 1.2 0.9  PROT 8.1 7.7 7.1  ALBUMIN 4.8 4.5 4.0   Recent Labs  Lab 05/21/20 1605 05/22/20 1356  LIPASE 35 32   No results for input(s): AMMONIA in the last 168 hours. Coagulation Profile: No results for input(s): INR, PROTIME in the last 168 hours. Cardiac Enzymes: No results for input(s): CKTOTAL, CKMB, CKMBINDEX, TROPONINI in the last 168 hours. BNP (last 3 results) No results for input(s): PROBNP in the last 8760 hours. HbA1C: No results for input(s): HGBA1C in the last 72 hours. CBG: No results for input(s): GLUCAP in the last 168 hours. Lipid Profile: No results for input(s): CHOL, HDL, LDLCALC, TRIG, CHOLHDL, LDLDIRECT in the last 72 hours. Thyroid Function Tests: No results for input(s): TSH, T4TOTAL, FREET4, T3FREE, THYROIDAB in the last 72 hours. Anemia Panel: No results for input(s): VITAMINB12, FOLATE, FERRITIN, TIBC, IRON, RETICCTPCT in the last 72 hours. Sepsis Labs: No results for input(s): PROCALCITON, LATICACIDVEN in the last 168 hours.  Recent Results (from the past 240 hour(s))  Resp Panel by RT-PCR (Flu A&B, Covid) Nasopharyngeal Swab     Status: None   Collection Time: 05/22/20  9:33 PM   Specimen: Nasopharyngeal Swab; Nasopharyngeal(NP) swabs in vial transport medium  Result Value Ref Range Status   SARS Coronavirus 2 by RT PCR NEGATIVE NEGATIVE Final    Comment: (NOTE) SARS-CoV-2 target nucleic acids are NOT DETECTED.  The SARS-CoV-2 RNA is generally detectable in upper respiratory specimens during the acute phase of infection. The lowest concentration of SARS-CoV-2 viral copies this assay can detect is 138 copies/mL. A negative result does not preclude SARS-Cov-2 infection and should not be used as the sole basis for treatment or other patient management decisions. A negative result may occur with  improper specimen collection/handling, submission of specimen other than nasopharyngeal  swab, presence of viral mutation(s) within the areas targeted by this assay, and inadequate number of viral copies(<138 copies/mL). A negative result must be combined with clinical observations, patient history, and epidemiological information. The expected result is Negative.  Fact Sheet for Patients:  05/24/20  Fact Sheet for Healthcare Providers:  05/24/20  This test is no t yet approved or cleared by the [employer] FDA and  has been authorized for detection and/or diagnosis of SARS-CoV-2 by FDA under an Emergency Use Authorization (EUA). This EUA will remain  in effect (meaning this test can be used) for the duration of the COVID-19 declaration under Section 564(b)(1) of the Act, 21 U.S.C.section 360bbb-3(b)(1), unless the authorization is terminated  or revoked sooner.       Influenza A by PCR NEGATIVE NEGATIVE Final   Influenza B by PCR NEGATIVE NEGATIVE Final    Comment: (NOTE) The Xpert Xpress SARS-CoV-2/FLU/RSV plus assay is intended as an aid in the diagnosis of influenza from  Nasopharyngeal swab specimens and should not be used as a sole basis for treatment. Nasal washings and aspirates are unacceptable for Xpert Xpress SARS-CoV-2/FLU/RSV testing.  Fact Sheet for Patients: [employer]  Fact Sheet for Healthcare Providers: [employer]it  This test is not yet approved or cleared by the [place] FDA and has been authorized for detection and/or diagnosis of SARS-CoV-2 by FDA under an Emergency Use Authorization (EUA). This EUA will remain in effect (meaning this test can be used) for the duration of the COVID-19 declaration under Section 564(b)(1) of the Act, 21 U.S.C. section 360bbb-3(b)(1), unless the authorization is terminated or revoked.  Performed at [facility], [address] [place], [place] [address]       Radiology Studies: CT Renal Stone Study  Result Date: 05/22/2020 CLINICAL DATA:  Sudden onset of left back and flank pain. EXAM: CT ABDOMEN AND PELVIS WITHOUT CONTRAST TECHNIQUE: Multidetector CT imaging of the abdomen and pelvis was performed following the standard protocol without IV contrast. COMPARISON:  May 08, 2020 FINDINGS: Lower chest: No acute abnormality. Hepatobiliary: No focal liver abnormality is seen. No gallstones, gallbladder wall thickening, or biliary dilatation. Pancreas: Unremarkable. No pancreatic ductal dilatation or surrounding inflammatory changes. Spleen: Normal in size without focal abnormality. Adrenals/Urinary Tract: Normal adrenal glands. Tiny nonobstructive calculus, 1-2 mm in the midpolar region of right kidney. A collection of 213 ureteral stones in the proximal left ureter, slightly past the UPJ, the largest calculus measures 9 mm. Subsequent obstructive uropathy of the left kidney with moderate left hydronephrosis and proximal hydroureter. Additional nonobstructive 1-2 mm calculus in the upper pole of the left kidney. Normal appearance of the urinary bladder. Stomach/Bowel: Stomach is within normal limits. No evidence of appendicitis. No evidence of bowel wall thickening, distention, or inflammatory changes. Vascular/Lymphatic: No significant vascular findings are present. No enlarged abdominal or pelvic lymph nodes. Reproductive: Prostate is unremarkable. Other: No abdominal wall hernia or abnormality. No abdominopelvic ascites. Musculoskeletal: Spondylosis of the thoracolumbar spine. IMPRESSION: 1. A collection of 2-3 ureteral stones in the proximal left ureter, slightly past the UPJ, with the largest calculus measuring 9 mm. Subsequent obstructive uropathy of the left kidney with moderate left hydronephrosis and proximal hydroureter. 2. Additional tiny nonobstructive bilateral renal calculi. Electronically Signed   By: Ted Mcalpine M.D.   On: 05/22/2020 15:13     Scheduled Meds: . enoxaparin (LOVENOX) injection  0.5 mg/kg Subcutaneous Q24H  . tamsulosin  0.4 mg Oral Daily   Continuous Infusions: . sodium chloride 75 mL/hr at 05/24/20 0300     LOS: 1 day   Rickey Barbara, MD [facility] Pager On Amion  If 7PM-7AM, please contact night-coverage 05/24/2020, 1:25 PM

## 2020-05-25 ENCOUNTER — Other Ambulatory Visit: Payer: Self-pay | Admitting: Radiology

## 2020-05-25 ENCOUNTER — Encounter
Admission: EM | Disposition: A | Discharge status: Home / Self Care | Payer: Self-pay | Source: Home / Self Care | Attending: Internal Medicine

## 2020-05-25 DIAGNOSIS — N200 Calculus of kidney: Secondary | ICD-10-CM

## 2020-05-25 LAB — COMPREHENSIVE METABOLIC PANEL
ALT: 26 U/L (ref 0–44)
AST: 20 U/L (ref 15–41)
Albumin: 3.8 g/dL (ref 3.5–5.0)
Alkaline Phosphatase: 57 U/L (ref 38–126)
Anion gap: 9 (ref 5–15)
BUN: 17 mg/dL (ref 6–20)
CO2: 24 mmol/L (ref 22–32)
Calcium: 9.6 mg/dL (ref 8.9–10.3)
Chloride: 103 mmol/L (ref 98–111)
Creatinine, Ser: 0.87 mg/dL (ref 0.61–1.24)
GFR, Estimated: >60 mL/min (ref >60)
Glucose, Bld: 114 mg/dL — ABNORMAL HIGH (ref 70–99)
Potassium: 4.2 mmol/L (ref 3.5–5.1)
Sodium: 136 mmol/L (ref 135–145)
Total Bilirubin: 1.1 mg/dL (ref 0.3–1.2)
Total Protein: 6.9 g/dL (ref 6.5–8.1)

## 2020-05-25 SURGERY — URETEROSCOPY, WITH LITHOTRIPSY USING HOLMIUM LASER
Anesthesia: Choice | Laterality: Left

## 2020-05-25 MED ORDER — HYDRALAZINE HCL 20 MG/ML IJ SOLN
10.0000 mg | INTRAMUSCULAR | Status: DC | PRN
  Administered 2020-05-25: 10 mg via INTRAVENOUS
  Filled 2020-05-25: qty 1

## 2020-05-25 MED ORDER — HYDROXYZINE HCL 10 MG PO TABS
10.0000 mg | ORAL_TABLET | ORAL | Status: DC | PRN
  Administered 2020-05-25: 10 mg via ORAL
  Filled 2020-05-25 (×3): qty 1

## 2020-05-25 NOTE — Progress Notes (Signed)
PROGRESS NOTE    Rickey Lane  QBH:[id number] DOB: 1966-01-15 DOA: 05/22/2020 PCP: Margaretann Loveless, PA-C    Brief Narrative:  54 y.o. male with medical history significant for paroxysmal atrial fibrillation status post cardioversion in the past.  History of recurrent kidney stones in the past that required lithotripsy for treatment. Pt normally follows [facility], last seen by Michiel Cowboy, PA-C in 07/22/19. Pt was admitted for intractable pain requiring IV analgesic  Assessment & Plan:   Active Problems:   Kidney stones  #1.  Acute left flank pain secondary to multiple left kidney stones,  -CT personally reviewed. Pt with 2-3 stones noted in L kidney with associated hydronephrosis. Largest stone noted to be 29mm -Renal function remains normal -Overnight and this afternoon, patient is complaining of continued pain that is poorly controlled. Pt is continued on toradol and dilaudid for breakthrough pain -Will continue on flomax and hydration -Urology is following. Plan for shockwave lithotripsy on Wed per Urology. Prophylactic lovenox and toradol have since been discontinued in anticipation for procedure -Will repeat bmet in AM  #2. Mild AKI:  -Suspected prerenal + postobstructive in etiology.  -resolved with IVF hydration -Recheck bmet in AM  #3.  Leukocytosis:  -Urinalysis not suggestive of UTI -WBC normalized, likely reactive at presentation  DVT prophylaxis: Lovenox subq Code Status: Full Family Communication: Pt in room, family not at bedside  Status is: Observation  The patient will require care spanning > 2 midnights and should be moved to inpatient because: Ongoing active pain requiring inpatient pain management  Dispo: The patient is from: Home              Anticipated d/c is to: Home              Anticipated d/c date is: 2 days              Patient currently is not medically stable to d/c.    Consultants:   Urology  Procedures:      Antimicrobials: Anti-infectives (From admission, onward)   None      Subjective: Reports feeling somewhat anxious this AM  Objective: Vitals:   05/25/20 0743 05/25/20 0744 05/25/20 0830 05/25/20 1118  BP: (!) 168/106 (!) 168/106 (!) 158/127 (!) 163/118  Pulse: (!) 101 90 (!) 101 91  Resp: 16 17  17   Temp: 98 F (36.7 C) 98 F (36.7 C)  98.2 F (36.8 C)  TempSrc: Oral Oral    SpO2: 95% 96%  100%  Weight:      Height:        Intake/Output Summary (Last 24 hours) at 05/25/2020 1326 Last data filed at 05/25/2020 1008 Gross per 24 hour  Intake 2512.7 ml  Output 2300 ml  Net 212.7 ml   Filed Weights   05/22/20 1333 05/23/20 0050  Weight: 99.8 kg 97.1 kg    Examination: General exam: Conversant, in no acute distress Respiratory system: normal chest rise, clear, no audible wheezing Cardiovascular system: regular rhythm, s1-s2 Gastrointestinal system: Nondistended, nontender, pos BS Central nervous system: No seizures, no tremors Extremities: No cyanosis, no joint deformities Skin: No rashes, no pallor Psychiatry: Affect normal // no auditory hallucinations   Data Reviewed: I have personally reviewed following labs and imaging studies  CBC: Recent Labs  Lab 05/21/20 1605 05/22/20 1356 05/24/20 0520  WBC 11.5* 14.9* 8.0  HGB 16.8 15.9 15.3  HCT 47.9 45.2 45.4  MCV 89.2 88.3 91.7  PLT 249 214 183   Basic  Metabolic Panel: Recent Labs  Lab 05/21/20 1605 05/22/20 1356 05/23/20 0413 05/24/20 0520 05/25/20 0647  NA 136 141 139 141 136  K 3.9 3.6 3.8 4.0 4.2  CL 100 106 107 105 103  CO2 24 22 25 28 24   GLUCOSE 113* 118* 100* 105* 114*  BUN 23* 22* 20 16 17   CREATININE 1.46* 1.32* 1.07 0.84 0.87  CALCIUM 10.2 10.6* 9.6 9.4 9.6   GFR: Estimated Creatinine Clearance: 114.7 mL/min (by C-G formula based on SCr of 0.87 mg/dL). Liver Function Tests: Recent Labs  Lab 05/21/20 1605 05/22/20 1356 05/24/20 0520 05/25/20 0647  AST 29 27 22 20   ALT 36  34 27 26  ALKPHOS 68 64 54 57  BILITOT 1.2 1.2 0.9 1.1  PROT 8.1 7.7 7.1 6.9  ALBUMIN 4.8 4.5 4.0 3.8   Recent Labs  Lab 05/21/20 1605 05/22/20 1356  LIPASE 35 32   No results for input(s): AMMONIA in the last 168 hours. Coagulation Profile: No results for input(s): INR, PROTIME in the last 168 hours. Cardiac Enzymes: No results for input(s): CKTOTAL, CKMB, CKMBINDEX, TROPONINI in the last 168 hours. BNP (last 3 results) No results for input(s): PROBNP in the last 8760 hours. HbA1C: No results for input(s): HGBA1C in the last 72 hours. CBG: No results for input(s): GLUCAP in the last 168 hours. Lipid Profile: No results for input(s): CHOL, HDL, LDLCALC, TRIG, CHOLHDL, LDLDIRECT in the last 72 hours. Thyroid Function Tests: No results for input(s): TSH, T4TOTAL, FREET4, T3FREE, THYROIDAB in the last 72 hours. Anemia Panel: No results for input(s): VITAMINB12, FOLATE, FERRITIN, TIBC, IRON, RETICCTPCT in the last 72 hours. Sepsis Labs: No results for input(s): PROCALCITON, LATICACIDVEN in the last 168 hours.  Recent Results (from the past 240 hour(s))  Resp Panel by RT-PCR (Flu A&B, Covid) Nasopharyngeal Swab     Status: None   Collection Time: 05/22/20  9:33 PM   Specimen: Nasopharyngeal Swab; Nasopharyngeal(NP) swabs in vial transport medium  Result Value Ref Range Status   SARS Coronavirus 2 by RT PCR NEGATIVE NEGATIVE Final    Comment: (NOTE) SARS-CoV-2 target nucleic acids are NOT DETECTED.  The SARS-CoV-2 RNA is generally detectable in upper respiratory specimens during the acute phase of infection. The lowest concentration of SARS-CoV-2 viral copies this assay can detect is 138 copies/mL. A negative result does not preclude SARS-Cov-2 infection and should not be used as the sole basis for treatment or other patient management decisions. A negative result may occur with  improper specimen collection/handling, submission of specimen other than nasopharyngeal swab,  presence of viral mutation(s) within the areas targeted by this assay, and inadequate number of viral copies(<138 copies/mL). A negative result must be combined with clinical observations, patient history, and epidemiological information. The expected result is Negative.  Fact Sheet for Patients:  05/23/20  Fact Sheet for Healthcare Providers:  05/24/20  This test is no t yet approved or cleared by the 05/24/20 FDA and  has been authorized for detection and/or diagnosis of SARS-CoV-2 by FDA under an Emergency Use Authorization (EUA). This EUA will remain  in effect (meaning this test can be used) for the duration of the COVID-19 declaration under Section 564(b)(1) of the Act, 21 U.S.C.section 360bbb-3(b)(1), unless the authorization is terminated  or revoked sooner.       Influenza A by PCR NEGATIVE NEGATIVE Final   Influenza B by PCR NEGATIVE NEGATIVE Final    Comment: (NOTE) The Xpert Xpress SARS-CoV-2/FLU/RSV plus assay is intended as  an aid in the diagnosis of influenza from Nasopharyngeal swab specimens and should not be used as a sole basis for treatment. Nasal washings and aspirates are unacceptable for Xpert Xpress SARS-CoV-2/FLU/RSV testing.  Fact Sheet for Patients: BloggerCourse.com  Fact Sheet for Healthcare Providers: SeriousBroker.it  This test is not yet approved or cleared by the [place] FDA and has been authorized for detection and/or diagnosis of SARS-CoV-2 by FDA under an Emergency Use Authorization (EUA). This EUA will remain in effect (meaning this test can be used) for the duration of the COVID-19 declaration under Section 564(b)(1) of the Act, 21 U.S.C. section 360bbb-3(b)(1), unless the authorization is terminated or revoked.  Performed at [facility], [address], [place], [place] [address]      Radiology  Studies: DG Abd 1 View  Result Date: 05/24/2020 CLINICAL DATA:  Follow-up left ureteral calculus. EXAM: ABDOMEN - 1 VIEW COMPARISON:  CT scan 05/22/2020 FINDINGS: Persistent left ureteral calculus noted at the level of the L3 transverse process. No definite renal or bladder calculi. The lung bases are clear. The bowel gas pattern is unremarkable. The bony structures are intact IMPRESSION: Persistent left ureteral calculus. Electronically Signed   By: Rudie Meyer M.D.   On: 05/24/2020 14:55    Scheduled Meds: . tamsulosin  0.4 mg Oral Daily   Continuous Infusions: . sodium chloride 75 mL/hr at 05/25/20 0656     LOS: 2 days   Rickey Barbara, MD [facility] Pager On Amion  If 7PM-7AM, please contact night-coverage 05/25/2020, 1:26 PM

## 2020-05-25 NOTE — Progress Notes (Signed)
Reached out to MD to address BPs

## 2020-05-26 ENCOUNTER — Other Ambulatory Visit: Payer: Self-pay | Admitting: Radiology

## 2020-05-26 LAB — COMPREHENSIVE METABOLIC PANEL
ALT: 52 U/L — ABNORMAL HIGH (ref 0–44)
AST: 41 U/L (ref 15–41)
Albumin: 3.9 g/dL (ref 3.5–5.0)
Alkaline Phosphatase: 50 U/L (ref 38–126)
Anion gap: 11 (ref 5–15)
BUN: 12 mg/dL (ref 6–20)
CO2: 24 mmol/L (ref 22–32)
Calcium: 9.6 mg/dL (ref 8.9–10.3)
Chloride: 103 mmol/L (ref 98–111)
Creatinine, Ser: 0.74 mg/dL (ref 0.61–1.24)
GFR, Estimated: >60 mL/min (ref >60)
Glucose, Bld: 94 mg/dL (ref 70–99)
Potassium: 4 mmol/L (ref 3.5–5.1)
Sodium: 138 mmol/L (ref 135–145)
Total Bilirubin: 1.4 mg/dL — ABNORMAL HIGH (ref 0.3–1.2)
Total Protein: 6.9 g/dL (ref 6.5–8.1)

## 2020-05-26 MED ORDER — LISINOPRIL 5 MG PO TABS
5.0000 mg | ORAL_TABLET | Freq: Every day | ORAL | Status: DC
  Administered 2020-05-26: 5 mg via ORAL
  Filled 2020-05-26: qty 1

## 2020-05-26 MED ORDER — HYDRALAZINE HCL 10 MG PO TABS
10.0000 mg | ORAL_TABLET | Freq: Four times a day (QID) | ORAL | Status: DC
  Administered 2020-05-26 (×2): 10 mg via ORAL
  Filled 2020-05-26 (×5): qty 1

## 2020-05-26 MED ORDER — SODIUM CHLORIDE 0.9 % IV SOLN: INTRAVENOUS | Status: DC

## 2020-05-26 MED ORDER — DIPHENHYDRAMINE HCL 25 MG PO CAPS: 25.0000 mg | ORAL_CAPSULE | ORAL | Status: DC

## 2020-05-26 MED ORDER — DIAZEPAM 5 MG PO TABS: 10.0000 mg | ORAL_TABLET | ORAL | Status: DC

## 2020-05-26 MED ORDER — CEPHALEXIN 500 MG PO CAPS: 500.0000 mg | ORAL_CAPSULE | ORAL | Status: DC

## 2020-05-26 NOTE — Progress Notes (Signed)
PROGRESS NOTE    Rickey Lane  LOV:[id number] DOB: 1965/09/13 DOA: 05/22/2020 PCP: Margaretann Loveless, PA-C    Brief Narrative:  54 y.o. male with medical history significant for paroxysmal atrial fibrillation status post cardioversion in the past.  History of recurrent kidney stones in the past that required lithotripsy for treatment. Pt normally follows [facility], last seen by Michiel Cowboy, PA-C in 07/22/19. Pt was admitted for intractable pain requiring IV analgesic  Assessment & Plan:   Active Problems:   Kidney stones  #1.  Acute left flank pain secondary to multiple left kidney stones,  -CT personally reviewed. Pt with 2-3 stones noted in L kidney with associated hydronephrosis. Largest stone noted to be 87mm -Renal function remains normal -Pain meds as needed -Will continue on flomax and hydration -Urology is following. Plan for shockwave lithotripsy tomorrow on 12/2 per Urology.   #2. Mild AKI:  -Suspected prerenal + postobstructive in etiology.  -resolved with IVF hydration  #3.  Leukocytosis:  -Urinalysis not suggestive of UTI -WBC normalized, likely reactive at presentation  #4 Uncontrolled hypertension Start lisinopril 5 mg p.o. daily and hydralazine 10 mg p.o. every 6 hours  DVT prophylaxis: Lovenox subq Code Status: Full Family Communication: Pt in room, family not at bedside  Status is: Inpatient  The patient will require care spanning > 2 midnights and should be moved to inpatient because: Ongoing active pain requiring inpatient pain management  Dispo: The patient is from: Home              Anticipated d/c is to: Home              Anticipated d/c date is: 2 days              Patient currently is not medically stable to d/c.  Awaiting ESWL tomorrow on 12/2    Consultants:   Urology  Procedures:   ESWL planned for 12/2  Antimicrobials: Anti-infectives (From admission, onward)   None      Subjective: No complaints.   Waiting for procedure tomorrow  Objective: Vitals:   05/25/20 1951 05/26/20 0029 05/26/20 0830 05/26/20 1623  BP: (!) 148/107 (!) 143/109 (!) 148/111 (!) 165/114  Pulse: 97 95 91 96  Resp:   17 15  Temp:  97.6 F (36.4 C) 98.6 F (37 C) 98.2 F (36.8 C)  TempSrc:   Oral Oral  SpO2: 100% 98% 98% 100%  Weight:      Height:        Intake/Output Summary (Last 24 hours) at 05/26/2020 1731 Last data filed at 05/26/2020 1300 Gross per 24 hour  Intake 1551.46 ml  Output --  Net 1551.46 ml   Filed Weights   05/22/20 1333 05/23/20 0050  Weight: 99.8 kg 97.1 kg    Examination: General exam: Conversant, in no acute distress Respiratory system: normal chest rise, clear, no audible wheezing Cardiovascular system: regular rhythm, s1-s2 Gastrointestinal system: Nondistended, nontender, pos BS Central nervous system: No seizures, no tremors Extremities: No cyanosis, no joint deformities Skin: No rashes, no pallor Psychiatry: Affect normal // no auditory hallucinations   Data Reviewed: I have personally reviewed following labs and imaging studies  CBC: Recent Labs  Lab 05/21/20 1605 05/22/20 1356 05/24/20 0520  WBC 11.5* 14.9* 8.0  HGB 16.8 15.9 15.3  HCT 47.9 45.2 45.4  MCV 89.2 88.3 91.7  PLT 249 214 183   Basic Metabolic Panel: Recent Labs  Lab 05/22/20 1356 05/23/20 0413 05/24/20 0520 05/25/20 8841  05/26/20 0502  NA 141 139 141 136 138  K 3.6 3.8 4.0 4.2 4.0  CL 106 107 105 103 103  CO2 22 25 28 24 24   GLUCOSE 118* 100* 105* 114* 94  BUN 22* 20 16 17 12   CREATININE 1.32* 1.07 0.84 0.87 0.74  CALCIUM 10.6* 9.6 9.4 9.6 9.6   GFR: Estimated Creatinine Clearance: 124.8 mL/min (by C-G formula based on SCr of 0.74 mg/dL). Liver Function Tests: Recent Labs  Lab 05/21/20 1605 05/22/20 1356 05/24/20 0520 05/25/20 0647 05/26/20 0502  AST 29 27 22 20  41  ALT 36 34 27 26 52*  ALKPHOS 68 64 54 57 50  BILITOT 1.2 1.2 0.9 1.1 1.4*  PROT 8.1 7.7 7.1 6.9 6.9   ALBUMIN 4.8 4.5 4.0 3.8 3.9   Recent Labs  Lab 05/21/20 1605 05/22/20 1356  LIPASE 35 32   No results for input(s): AMMONIA in the last 168 hours. Coagulation Profile: No results for input(s): INR, PROTIME in the last 168 hours. Cardiac Enzymes: No results for input(s): CKTOTAL, CKMB, CKMBINDEX, TROPONINI in the last 168 hours. BNP (last 3 results) No results for input(s): PROBNP in the last 8760 hours. HbA1C: No results for input(s): HGBA1C in the last 72 hours. CBG: No results for input(s): GLUCAP in the last 168 hours. Lipid Profile: No results for input(s): CHOL, HDL, LDLCALC, TRIG, CHOLHDL, LDLDIRECT in the last 72 hours. Thyroid Function Tests: No results for input(s): TSH, T4TOTAL, FREET4, T3FREE, THYROIDAB in the last 72 hours. Anemia Panel: No results for input(s): VITAMINB12, FOLATE, FERRITIN, TIBC, IRON, RETICCTPCT in the last 72 hours. Sepsis Labs: No results for input(s): PROCALCITON, LATICACIDVEN in the last 168 hours.  Recent Results (from the past 240 hour(s))  Resp Panel by RT-PCR (Flu A&B, Covid) Nasopharyngeal Swab     Status: None   Collection Time: 05/22/20  9:33 PM   Specimen: Nasopharyngeal Swab; Nasopharyngeal(NP) swabs in vial transport medium  Result Value Ref Range Status   SARS Coronavirus 2 by RT PCR NEGATIVE NEGATIVE Final    Comment: (NOTE) SARS-CoV-2 target nucleic acids are NOT DETECTED.  The SARS-CoV-2 RNA is generally detectable in upper respiratory specimens during the acute phase of infection. The lowest concentration of SARS-CoV-2 viral copies this assay can detect is 138 copies/mL. A negative result does not preclude SARS-Cov-2 infection and should not be used as the sole basis for treatment or other patient management decisions. A negative result may occur with  improper specimen collection/handling, submission of specimen other than nasopharyngeal swab, presence of viral mutation(s) within the areas targeted by this assay, and  inadequate number of viral copies(<138 copies/mL). A negative result must be combined with clinical observations, patient history, and epidemiological information. The expected result is Negative.  Fact Sheet for Patients:  05/23/20  Fact Sheet for Healthcare Providers:  05/24/20  This test is no t yet approved or cleared by the 05/24/20 FDA and  has been authorized for detection and/or diagnosis of SARS-CoV-2 by FDA under an Emergency Use Authorization (EUA). This EUA will remain  in effect (meaning this test can be used) for the duration of the COVID-19 declaration under Section 564(b)(1) of the Act, 21 U.S.C.section 360bbb-3(b)(1), unless the authorization is terminated  or revoked sooner.       Influenza A by PCR NEGATIVE NEGATIVE Final   Influenza B by PCR NEGATIVE NEGATIVE Final    Comment: (NOTE) The Xpert Xpress SARS-CoV-2/FLU/RSV plus assay is intended as an aid in the diagnosis of  influenza from Nasopharyngeal swab specimens and should not be used as a sole basis for treatment. Nasal washings and aspirates are unacceptable for Xpert Xpress SARS-CoV-2/FLU/RSV testing.  Fact Sheet for Patients: BloggerCourse.com  Fact Sheet for Healthcare Providers: SeriousBroker.it  This test is not yet approved or cleared by the [place] FDA and has been authorized for detection and/or diagnosis of SARS-CoV-2 by FDA under an Emergency Use Authorization (EUA). This EUA will remain in effect (meaning this test can be used) for the duration of the COVID-19 declaration under Section 564(b)(1) of the Act, 21 U.S.C. section 360bbb-3(b)(1), unless the authorization is terminated or revoked.  Performed at [facility], [address], [place], [place] [address]      Radiology Studies: No results found.  Scheduled Meds: . hydrALAZINE  10 mg Oral  Q6H  . lisinopril  5 mg Oral Daily  . tamsulosin  0.4 mg Oral Daily   Continuous Infusions: . sodium chloride 75 mL/hr at 05/26/20 1012     LOS: 3 days   Delfino Lovett, MD [facility] Pager On Amion  If 7PM-7AM, please contact night-coverage 05/26/2020, 5:31 PM

## 2020-05-26 NOTE — Progress Notes (Addendum)
Brief Pharmacy Note:    Patient was started on lisinopril 5 mg daily today (remote h/o taking medication) due to elevated BP.   Per chart review and discussion with patient, he was previously taking Pradaxa for afib (he was on Eliquis but switched to Pradaxa d/t drug availability at [facility]), metoprolol (afib ), lisinopril ( HTN). However, patient stated he stopped taking the above medications because he "felt they were not working". I explained the purpose of each medication and mechanism of action (he would not be able to feel they worked but compliance ensures they work). I also informed him that lisinopril was started inpatient to help control his blood pressure while he was here and that I am not sure if lisinopril will be continued when discharged. Patient verbalized understanding.   Cephus Shelling, PharmD Clinical Pharmacist

## 2020-05-27 ENCOUNTER — Encounter: Payer: Self-pay | Admitting: Internal Medicine

## 2020-05-27 ENCOUNTER — Inpatient Hospital Stay (HOSPITAL_COMMUNITY)
Admit: 2020-05-27 | Discharge: 2020-05-27 | Disposition: A | Discharge status: Home / Self Care | Payer: Self-pay | Attending: Cardiovascular Disease | Admitting: Cardiovascular Disease

## 2020-05-27 ENCOUNTER — Ambulatory Visit: Admission: RE | Admit: 2020-05-27 | Payer: Self-pay | Source: Home / Self Care | Admitting: Urology

## 2020-05-27 ENCOUNTER — Encounter
Admission: EM | Disposition: A | Discharge status: Home / Self Care | Payer: Self-pay | Source: Home / Self Care | Attending: Internal Medicine

## 2020-05-27 ENCOUNTER — Inpatient Hospital Stay: Payer: Self-pay

## 2020-05-27 DIAGNOSIS — I10 Essential (primary) hypertension: Secondary | ICD-10-CM

## 2020-05-27 DIAGNOSIS — I4891 Unspecified atrial fibrillation: Secondary | ICD-10-CM

## 2020-05-27 DIAGNOSIS — N201 Calculus of ureter: Secondary | ICD-10-CM

## 2020-05-27 DIAGNOSIS — I4811 Longstanding persistent atrial fibrillation: Secondary | ICD-10-CM

## 2020-05-27 DIAGNOSIS — N200 Calculus of kidney: Secondary | ICD-10-CM

## 2020-05-27 HISTORY — PX: EXTRACORPOREAL SHOCK WAVE LITHOTRIPSY: SHX1557

## 2020-05-27 LAB — CBC
HCT: 46.3 % (ref 39.0–52.0)
Hemoglobin: 16.1 g/dL (ref 13.0–17.0)
MCH: 31.3 pg (ref 26.0–34.0)
MCHC: 34.8 g/dL (ref 30.0–36.0)
MCV: 90.1 fL (ref 80.0–100.0)
Platelets: 192 10*3/uL (ref 150–400)
RBC: 5.14 MIL/uL (ref 4.22–5.81)
RDW: 11.9 % (ref 11.5–15.5)
WBC: 7.6 10*3/uL (ref 4.0–10.5)
nRBC: 0 % (ref 0.0–0.2)

## 2020-05-27 LAB — ECHOCARDIOGRAM COMPLETE
AR max vel: 2.28 cm2
AV Area VTI: 2.32 cm2
AV Area mean vel: 2.29 cm2
AV Mean grad: 4.5 mmHg
AV Peak grad: 8.6 mmHg
Ao pk vel: 1.47 m/s
Area-P 1/2: 4.49 cm2
Height: 70 in
S' Lateral: 2.83 cm
Weight: 3520 oz

## 2020-05-27 LAB — BASIC METABOLIC PANEL
Anion gap: 9 (ref 5–15)
BUN: 14 mg/dL (ref 6–20)
CO2: 25 mmol/L (ref 22–32)
Calcium: 10 mg/dL (ref 8.9–10.3)
Chloride: 103 mmol/L (ref 98–111)
Creatinine, Ser: 0.73 mg/dL (ref 0.61–1.24)
GFR, Estimated: >60 mL/min (ref >60)
Glucose, Bld: 87 mg/dL (ref 70–99)
Potassium: 4 mmol/L (ref 3.5–5.1)
Sodium: 137 mmol/L (ref 135–145)

## 2020-05-27 SURGERY — LITHOTRIPSY, ESWL
Anesthesia: Moderate Sedation | Laterality: Left

## 2020-05-27 MED ORDER — PANTOPRAZOLE SODIUM 40 MG IV SOLR: 40.0000 mg | Freq: Two times a day (BID) | INTRAVENOUS | Status: DC

## 2020-05-27 MED ORDER — METOPROLOL TARTRATE 50 MG PO TABS
50.0000 mg | ORAL_TABLET | Freq: Two times a day (BID) | ORAL | Status: DC
  Administered 2020-05-27 – 2020-06-01 (×9): 50 mg via ORAL
  Filled 2020-05-27 (×11): qty 1

## 2020-05-27 MED ORDER — PANTOPRAZOLE SODIUM 40 MG IV SOLR
40.0000 mg | Freq: Two times a day (BID) | INTRAVENOUS | Status: DC
  Administered 2020-05-27 – 2020-05-29 (×3): 40 mg via INTRAVENOUS
  Filled 2020-05-27 (×3): qty 40

## 2020-05-27 MED ORDER — DIAZEPAM 5 MG PO TABS
ORAL_TABLET | ORAL | Status: AC
  Administered 2020-05-27: 10 mg
  Filled 2020-05-27: qty 2

## 2020-05-27 MED ORDER — ACETAMINOPHEN 500 MG PO TABS
500.0000 mg | ORAL_TABLET | Freq: Four times a day (QID) | ORAL | Status: DC | PRN
  Administered 2020-05-27 – 2020-05-29 (×2): 500 mg via ORAL
  Filled 2020-05-27 (×2): qty 1

## 2020-05-27 MED ORDER — HYDRALAZINE HCL 20 MG/ML IJ SOLN
5.0000 mg | Freq: Once | INTRAMUSCULAR | Status: AC
  Administered 2020-05-27: 5 mg via INTRAVENOUS
  Filled 2020-05-27: qty 1

## 2020-05-27 MED ORDER — DIPHENHYDRAMINE HCL 25 MG PO CAPS
ORAL_CAPSULE | ORAL | Status: AC
  Administered 2020-05-27: 25 mg
  Filled 2020-05-27: qty 1

## 2020-05-27 MED ORDER — CEPHALEXIN 500 MG PO CAPS
ORAL_CAPSULE | ORAL | Status: AC
  Administered 2020-05-27: 500 mg
  Filled 2020-05-27: qty 1

## 2020-05-27 NOTE — Interval H&P Note (Signed)
History and Physical Interval Note:  05/27/2020 11:43 AM  Rickey Lane  has presented today for surgery, with the diagnosis of kidney stone.  The various methods of treatment have been discussed with the patient and family. After consideration of risks, benefits and other options for treatment, the patient has consented to  Procedure(s): EXTRACORPOREAL SHOCK WAVE LITHOTRIPSY (ESWL) (Left) as a surgical intervention.  The patient's history has been reviewed, patient examined, no change in status, stable for surgery.  I have reviewed the patient's chart and labs.  Questions were answered to the patient's satisfaction.    RRR cTAB  [name]

## 2020-05-27 NOTE — Plan of Care (Signed)
  Problem: Education: Goal: Knowledge of General Education information will improve Description Including pain rating scale, medication(s)/side effects and non-pharmacologic comfort measures Outcome: Progressing   

## 2020-05-27 NOTE — Progress Notes (Signed)
   05/27/20 1515  Clinical Encounter Type  Visited With Patient  Visit Type Initial;Psychological support;Spiritual support  Referral From Nurse  Consult/Referral To Chaplain  Ch responded to OR for prayer. When I arrived at the room the Pt was alert and happy to see me. Pt wanted me to pray for his health. Ch prayed for Pt health and will follow-up later.

## 2020-05-27 NOTE — Progress Notes (Signed)
05/27/20  Subjective:  continues to have intermittent pain from obstructing stone requiring narcotics and ongoing admission.  He is brought down to the lithotripter machine this morning after receiving premeds including Zofran, Valium and Benadryl.  Upon being hooked up to the monitor, he was noted to have heart rates ranging anywhere from 1 15-1 35 and one rhythm strip, consistent with new onset A. fib.  He did endorse that he has been diagnosed with this for 5 years ago but stopped taking meds.  Exam: Mild left CVA tenderness  Assessment and plan:  1.  New onset A. Fib-tachycardia with evidence of new onset A. fib.  Recommend cardiology consult, communicated this recommendation with Dr. Shah.  Unable to treat with ESWL today as result of his tachyarrhythmia.  2.  Left ureteral calculus-refractory pain with large obstructing stone.  As previously, strongly recommend left ureteroscopy, laser lithotripsy and stent placement.  We will arrange for this tomorrow once has been cleared from a cardiac standpoint.  Please make him n.p.o. midnight if clear.  Okay to anticoagulate.  Risk-benefit is discussed.  All questions answered.  I also discussed this case with my partner, Dr. Sninsky who is agreeable to perform the procedure tomorrow.  Zamarah Ullmer, MD   

## 2020-05-27 NOTE — Plan of Care (Signed)

## 2020-05-27 NOTE — Progress Notes (Signed)
PROGRESS NOTE    Rickey Lane  DVV:[id number] DOB: [birth year] DOA: 05/22/2020 PCP: Margaretann Loveless, PA-C    Brief Narrative:  54 y.o. male with medical history significant for paroxysmal atrial fibrillation status post cardioversion in the past.  History of recurrent kidney stones in the past that required lithotripsy for treatment. Pt normally follows [facility], last seen by Michiel Cowboy, PA-C in 07/22/19. Pt was admitted for intractable pain requiring IV analgesic  Assessment & Plan:   Active Problems:   Kidney stones   * Permanent A.fib - While he was down for ESWL today - went in rapid a.fib and procedure canceled - Dr Apolinar Junes has rescheduled the procedure for tomorrow. - Cardio Dr Lewie Loron seen and cleared for procedure - started metoprolol for rate control. No need for anticoagulation per cardio for now - he admits being non-compliant in past   #1.  Acute left flank pain secondary to multiple left kidney stones,  -CT personally reviewed. Pt with 2-3 stones noted in L kidney with associated hydronephrosis. Largest stone noted to be 55mm -Renal function remains normal -Pain meds as needed -Will continue on flomax and hydration -Urology is following. Plan for shockwave lithotripsy tomorrow on 12/3 per Urology.   #2. Mild AKI:  -Suspected prerenal + postobstructive in etiology.  -resolved with IVF hydration  #3.  Leukocytosis:  -Urinalysis not suggestive of UTI -WBC normalized, likely reactive at presentation  #4 Uncontrolled hypertension - improved continue lisinopril 5 mg p.o. daily and metoprolol 50 mg po bid. D/C hydralazine  Poor social situation - reports no home, job, insurance, poor finances... - c/s TOC  DVT prophylaxis: Lovenox subq Code Status: Full Family Communication: Pt in room, family not at bedside  Status is: Inpatient  The patient will require care spanning > 2 midnights and should be moved to inpatient because: Ongoing  active pain requiring inpatient pain management  Dispo: The patient is from: Home              Anticipated d/c is to: Home              Anticipated d/c date is: 2 days              Patient currently is not medically stable to d/c.  Awaiting ESWL tomorrow on 12/3    Consultants:   Urology  Cardio  Procedures:   ESWL planned for 12/3  Antimicrobials: Anti-infectives (From admission, onward)   Start     Dose/Rate Route Frequency Ordered Stop   05/27/20 0845  cephALEXin (KEFLEX) 500 MG capsule       Note to Pharmacy: [name]   : cabinet override      05/27/20 0845 05/27/20 0857      Subjective: Seems upset but settling down. Wants to talk to someone about his homelessness, finances, meds help. Denies any other complaints  Objective: Vitals:   05/27/20 0735 05/27/20 0900 05/27/20 1119 05/27/20 1611  BP: 134/87 135/81 140/85 111/71  Pulse: (!) 103 86 (!) 57 90  Resp: 18 18 17 17   Temp: 97.6 F (36.4 C) (!) 96.4 F (35.8 C) 98.3 F (36.8 C) 98 F (36.7 C)  TempSrc: Oral  Oral   SpO2: 100% 100% 100% 97%  Weight:  99.8 kg    Height:  5\' 10"  (1.778 m)      Intake/Output Summary (Last 24 hours) at 05/27/2020 1743 Last data filed at 05/27/2020 1427 Gross per 24 hour  Intake 240 ml  Output --  Net 240 ml   Filed Weights   05/22/20 1333 05/23/20 0050 05/27/20 0900  Weight: 99.8 kg 97.1 kg 99.8 kg    Examination: General exam: Conversant, in no acute distress Respiratory system: normal chest rise, clear, no audible wheezing Cardiovascular system: regular rhythm, s1-s2 Gastrointestinal system: Nondistended, nontender, pos BS Central nervous system: No seizures, no tremors Extremities: No cyanosis, no joint deformities Skin: No rashes, no pallor Psychiatry: Affect normal // no auditory hallucinations   Data Reviewed: I have personally reviewed following labs and imaging studies  CBC: Recent Labs  Lab 05/21/20 1605 05/22/20 1356 05/24/20 0520  05/27/20 0450  WBC 11.5* 14.9* 8.0 7.6  HGB 16.8 15.9 15.3 16.1  HCT 47.9 45.2 45.4 46.3  MCV 89.2 88.3 91.7 90.1  PLT 249 214 183 192   Basic Metabolic Panel: Recent Labs  Lab 05/23/20 0413 05/24/20 0520 05/25/20 0647 05/26/20 0502 05/27/20 0450  NA 139 141 136 138 137  K 3.8 4.0 4.2 4.0 4.0  CL 107 105 103 103 103  CO2 25 28 24 24 25   GLUCOSE 100* 105* 114* 94 87  BUN 20 16 17 12 14   CREATININE 1.07 0.84 0.87 0.74 0.73  CALCIUM 9.6 9.4 9.6 9.6 10.0   GFR: Estimated Creatinine Clearance: 126.4 mL/min (by C-G formula based on SCr of 0.73 mg/dL). Liver Function Tests: Recent Labs  Lab 05/21/20 1605 05/22/20 1356 05/24/20 0520 05/25/20 0647 05/26/20 0502  AST 29 27 22 20  41  ALT 36 34 27 26 52*  ALKPHOS 68 64 54 57 50  BILITOT 1.2 1.2 0.9 1.1 1.4*  PROT 8.1 7.7 7.1 6.9 6.9  ALBUMIN 4.8 4.5 4.0 3.8 3.9   Recent Labs  Lab 05/21/20 1605 05/22/20 1356  LIPASE 35 32   No results for input(s): AMMONIA in the last 168 hours. Coagulation Profile: No results for input(s): INR, PROTIME in the last 168 hours. Cardiac Enzymes: No results for input(s): CKTOTAL, CKMB, CKMBINDEX, TROPONINI in the last 168 hours. BNP (last 3 results) No results for input(s): PROBNP in the last 8760 hours. HbA1C: No results for input(s): HGBA1C in the last 72 hours. CBG: No results for input(s): GLUCAP in the last 168 hours. Lipid Profile: No results for input(s): CHOL, HDL, LDLCALC, TRIG, CHOLHDL, LDLDIRECT in the last 72 hours. Thyroid Function Tests: No results for input(s): TSH, T4TOTAL, FREET4, T3FREE, THYROIDAB in the last 72 hours. Anemia Panel: No results for input(s): VITAMINB12, FOLATE, FERRITIN, TIBC, IRON, RETICCTPCT in the last 72 hours. Sepsis Labs: No results for input(s): PROCALCITON, LATICACIDVEN in the last 168 hours.  Recent Results (from the past 240 hour(s))  Resp Panel by RT-PCR (Flu A&B, Covid) Nasopharyngeal Swab     Status: None   Collection Time: 05/22/20   9:33 PM   Specimen: Nasopharyngeal Swab; Nasopharyngeal(NP) swabs in vial transport medium  Result Value Ref Range Status   SARS Coronavirus 2 by RT PCR NEGATIVE NEGATIVE Final    Comment: (NOTE) SARS-CoV-2 target nucleic acids are NOT DETECTED.  The SARS-CoV-2 RNA is generally detectable in upper respiratory specimens during the acute phase of infection. The lowest concentration of SARS-CoV-2 viral copies this assay can detect is 138 copies/mL. A negative result does not preclude SARS-Cov-2 infection and should not be used as the sole basis for treatment or other patient management decisions. A negative result may occur with  improper specimen collection/handling, submission of specimen other than nasopharyngeal swab, presence of viral mutation(s) within the areas targeted by this assay, and inadequate  number of viral copies(<138 copies/mL). A negative result must be combined with clinical observations, patient history, and epidemiological information. The expected result is Negative.  Fact Sheet for Patients:  BloggerCourse.com  Fact Sheet for Healthcare Providers:  SeriousBroker.it  This test is no t yet approved or cleared by the [place] FDA and  has been authorized for detection and/or diagnosis of SARS-CoV-2 by FDA under an Emergency Use Authorization (EUA). This EUA will remain  in effect (meaning this test can be used) for the duration of the COVID-19 declaration under Section 564(b)(1) of the Act, 21 U.S.C.section 360bbb-3(b)(1), unless the authorization is terminated  or revoked sooner.       Influenza A by PCR NEGATIVE NEGATIVE Final   Influenza B by PCR NEGATIVE NEGATIVE Final    Comment: (NOTE) The Xpert Xpress SARS-CoV-2/FLU/RSV plus assay is intended as an aid in the diagnosis of influenza from Nasopharyngeal swab specimens and should not be used as a sole basis for treatment. Nasal washings and aspirates  are unacceptable for Xpert Xpress SARS-CoV-2/FLU/RSV testing.  Fact Sheet for Patients: BloggerCourse.com  Fact Sheet for Healthcare Providers: SeriousBroker.it  This test is not yet approved or cleared by the [place] FDA and has been authorized for detection and/or diagnosis of SARS-CoV-2 by FDA under an Emergency Use Authorization (EUA). This EUA will remain in effect (meaning this test can be used) for the duration of the COVID-19 declaration under Section 564(b)(1) of the Act, 21 U.S.C. section 360bbb-3(b)(1), unless the authorization is terminated or revoked.  Performed at [facility], [address] [place], [place] [address]      Radiology Studies: DG Abd 1 View  Result Date: 05/27/2020 CLINICAL DATA:  Left-sided ureteral calculus EXAM: ABDOMEN - 1 VIEW COMPARISON:  May 24, 2020 FINDINGS: Calcification again noted to the left of L3-4 measuring 0.9 x 0.9 cm. Tiny presumed phleboliths noted in the pelvis, stable. Moderate stool noted in colon. No bowel dilatation or air-fluid level to suggest bowel obstruction. No free air. Lung bases clear. IMPRESSION: Persistent 0.9 x 0.9 cm apparent ureteral calculus at the L3-4 level. No new calcifications. No bowel obstruction or free air. Electronically Signed   By: Bretta Bang III M.D.   On: 05/27/2020 08:56   ECHOCARDIOGRAM COMPLETE  Result Date: 05/27/2020    ECHOCARDIOGRAM REPORT   Patient Name:   ANTHONEY SHEPPARD Date of Exam: 05/27/2020 Medical Rec #:  [id number]           Height:       70.0 in Accession #:    [id number]          Weight:       220.0 lb Date of Birth:  1965/11/07          BSA:          2.174 m Patient Age:    54 years            BP:           140/85 mmHg Patient Gender: M                   HR:           57 bpm. Exam Location:  [facility] Procedure: 2D Echo, Cardiac Doppler and Color Doppler Indications:     Atrial Fibrillation 427.31  History:          Patient has prior history of Echocardiogram examinations, most  recent 10/01/2015. Arrythmias:Atrial Fibrillation. Headache.  Sonographer:     Cristela BlueJerry Hege RDCS (AE) Referring Phys:  [id number] Antonieta IbaIMOTHY J GOLLAN Diagnosing Phys: Julien Nordmannimothy Gollan MD  Sonographer Comments: Suboptimal apical window. IMPRESSIONS  1. Left ventricular ejection fraction, by estimation, is 55 %. The left ventricle has normal function. The left ventricle has no regional wall motion abnormalities. There is moderate left ventricular hypertrophy.  2. Right ventricular systolic function is normal. The right ventricular size is normal.  3. The mitral valve is normal in structure. No evidence of mitral valve regurgitation. No evidence of mitral stenosis.  4. Left atrial size was mildly dilated.  5. Rhythm is atrial fibrillation/flutter FINDINGS  Left Ventricle: Left ventricular ejection fraction, by estimation, is 55 to 60%. The left ventricle has normal function. The left ventricle has no regional wall motion abnormalities. The left ventricular internal cavity size was normal in size. There is  moderate left ventricular hypertrophy. Left ventricular diastolic parameters are indeterminate. Right Ventricle: The right ventricular size is normal. No increase in right ventricular wall thickness. Right ventricular systolic function is normal. Left Atrium: Left atrial size was mildly dilated. Right Atrium: Right atrial size was normal in size. Pericardium: There is no evidence of pericardial effusion. Mitral Valve: The mitral valve is normal in structure. No evidence of mitral valve regurgitation. No evidence of mitral valve stenosis. Tricuspid Valve: The tricuspid valve is normal in structure. Tricuspid valve regurgitation is not demonstrated. No evidence of tricuspid stenosis. Aortic Valve: The aortic valve is normal in structure. Aortic valve regurgitation is not visualized. No aortic stenosis is present. Aortic valve mean gradient measures 4.5  mmHg. Aortic valve peak gradient measures 8.6 mmHg. Aortic valve area, by VTI measures 2.32 cm. Pulmonic Valve: The pulmonic valve was normal in structure. Pulmonic valve regurgitation is not visualized. No evidence of pulmonic stenosis. Aorta: The aortic root is normal in size and structure. Venous: The inferior vena cava is normal in size with greater than 50% respiratory variability, suggesting right atrial pressure of 3 mmHg. IAS/Shunts: No atrial level shunt detected by color flow Doppler.  LEFT VENTRICLE PLAX 2D LVIDd:         4.59 cm LVIDs:         2.83 cm LV PW:         1.41 cm LV IVS:        1.27 cm LVOT diam:     2.10 cm LV SV:         42 LV SV Index:   19 LVOT Area:     3.46 cm  RIGHT VENTRICLE RV Basal diam:  3.57 cm RV S prime:     20.50 cm/s TAPSE (M-mode): 5.2 cm LEFT ATRIUM             Index       RIGHT ATRIUM           Index LA diam:        4.60 cm 2.12 cm/m  RA Area:     18.60 cm LA Vol (A2C):   66.6 ml 30.64 ml/m RA Volume:   47.40 ml  21.81 ml/m LA Vol (A4C):   56.5 ml 25.99 ml/m LA Biplane Vol: 63.6 ml 29.26 ml/m  AORTIC VALVE                   PULMONIC VALVE AV Area (Vmax):    2.28 cm    PV Vmax:        0.73 m/s AV Area (  Vmean):   2.29 cm    PV Peak grad:   2.1 mmHg AV Area (VTI):     2.32 cm    RVOT Peak grad: 3 mmHg AV Vmax:           147.00 cm/s AV Vmean:          91.350 cm/s AV VTI:            0.180 m AV Peak Grad:      8.6 mmHg AV Mean Grad:      4.5 mmHg LVOT Vmax:         96.80 cm/s LVOT Vmean:        60.400 cm/s LVOT VTI:          0.120 m LVOT/AV VTI ratio: 0.67  AORTA Ao Root diam: 3.20 cm MITRAL VALVE               TRICUSPID VALVE MV Area (PHT): 4.49 cm    TR Peak grad:   6.6 mmHg MV Decel Time: 169 msec    TR Vmax:        128.00 cm/s MV E velocity: 88.80 cm/s                            SHUNTS                            Systemic VTI:  0.12 m                            Systemic Diam: 2.10 cm Julien Nordmann MD Electronically signed by Julien Nordmann MD Signature Date/Time:  05/27/2020/12:59:50 PM    Final     Scheduled Meds: . lisinopril  5 mg Oral Daily  . metoprolol tartrate  50 mg Oral BID  . tamsulosin  0.4 mg Oral Daily   Continuous Infusions: . sodium chloride 75 mL/hr at 05/27/20 0032     LOS: 4 days   Delfino Lovett, MD [facility] Pager On Amion  If 7PM-7AM, please contact night-coverage 05/27/2020, 5:43 PM

## 2020-05-27 NOTE — Progress Notes (Signed)
*  PRELIMINARY RESULTS* Echocardiogram 2D Echocardiogram has been performed.  Rickey Lane 05/27/2020, 12:41 PM

## 2020-05-27 NOTE — Consult Note (Signed)
Cardiology Consultation:   Patient ID: Rickey Lane MRN: [id number]; DOB: [birth year]  Admit date: 05/22/2020 Date of Consult: 05/27/2020  Primary Care Provider: Margaretann Loveless, PA-C [facility] Cardiologist: [facility] Reason for consult: Atrial fibrillation with RVR Physician requesting consult: Dr. Sherryll Burger  Patient Profile:   Rickey Lane is a 54 y.o. male with a hx of permanent atrial fibrillation, sleep apnea, last seen by cardiology 2017, medication noncompliant, reports he is homeless, presents with abdominal pain, back pain, kidney stone noted to be in atrial fibrillation with RVR  History of Present Illness:   [name] reports he takes several over-the-counter supplements but does not take anticoagulation medication for atrial fibrillation or his metoprolol for rate control. He has noticed persistent shortness of breath.  This is a longstanding feature.  He has been told in the past 2 get back on his rate control medications but he has declined to do so. " I avoid doctors, because of doctors like Dr. Karen Kays"  He denies significant lower extremity edema, at times with some PND orthopnea  On arrival to the hospital noted to have significant hypertension He was seen in the hospital the day earlier but has this pain had resolved he refused additional work-up and was discharged  CT scan documenting multiple kidney stones, 9 mm proximal kidney stone on the left, some evidence of surrounding hydronephrosis Started on pain control and hydration Admitted to the hospital as he reported he was homeless, did not know if he had enough money to pay for his narcotic medication Living out of his car  Today was taking for lithotripsy, noted to have atrial fibrillation rate 115 up to 135 bpm, procedure was canceled   Past Medical History:  Diagnosis Date  . Atrial fibrillation (HCC)    a. initially diagnosed in 09/2015. Reported a history of "irregular HR" for 3+  years  . Dysrhythmia   . GERD (gastroesophageal reflux disease)   . Headache   . Kidney stone   . Sleep apnea     Past Surgical History:  Procedure Laterality Date  . ELECTROPHYSIOLOGIC STUDY N/A 11/04/2015   Procedure: CARDIOVERSION;  Surgeon: Iran Ouch, MD;  Location: [facility] ORS;  Service: Cardiovascular;  Laterality: N/A;  . lithotripsy       Home Medications:  Prior to Admission medications   Medication Sig Start Date End Date Taking? Authorizing Provider  acetaminophen (TYLENOL) 500 MG tablet Take 500-1,000 mg by mouth every 6 (six) hours as needed for mild pain or moderate pain.    Yes [provider]  famotidine (PEPCID) 20 MG tablet Take 20 mg by mouth 2 (two) times daily as needed for heartburn or indigestion.   Yes [provider]  Garlic 500 MG CAPS Take 1 capsule by mouth as directed.   Yes [provider]  Ginger 500 MG CAPS Take 1 capsule by mouth daily.   Yes [provider]  Glucosamine-Chondroitin (OSTEO BI-FLEX REGULAR STRENGTH) 250-200 MG TABS Take 1 tablet by mouth as directed.   Yes [provider]  Multiple Vitamin (MULTI-VITAMINS) TABS Take 1 tablet by mouth daily.    Yes [provider]  sildenafil (VIAGRA) 100 MG tablet Take 1 tablet (100 mg total) by mouth daily as needed for erectile dysfunction. Take two hours prior to intercourse on an empty stomach 07/22/19  Yes McGowan, Carollee Herter A, PA-C  zinc sulfate 220 (50 Zn) MG capsule Take 220 mg by mouth daily.   Yes [provider]  ketorolac (TORADOL) 10 MG tablet Take 1 tablet (10 mg total) by mouth every 6 (six) hours as needed for moderate pain. 05/22/20   Sharman Cheek, MD  ondansetron (ZOFRAN ODT) 4 MG disintegrating tablet Take 1 tablet (4 mg total) by mouth every 8 (eight) hours as needed for nausea or vomiting. 05/22/20   Sharman Cheek, MD  tamsulosin (FLOMAX) 0.4 MG CAPS capsule Take 1 capsule (0.4 mg total) by mouth daily for 10 days.  Discontinue after symptoms improve 05/22/20 06/01/20  Sharman Cheek, MD    Inpatient Medications: Scheduled Meds: . lisinopril  5 mg Oral Daily  . metoprolol tartrate  50 mg Oral BID  . tamsulosin  0.4 mg Oral Daily   Continuous Infusions: . sodium chloride 75 mL/hr at 05/27/20 0032   PRN Meds: acetaminophen, HYDROmorphone (DILAUDID) injection, hydrOXYzine, ondansetron **OR** ondansetron (ZOFRAN) IV, senna-docusate, traMADol  Allergies:    Allergies  Allergen Reactions  . Nitroglycerin Other (See Comments)    "Shivering" and "decreased heart rate"    Social History:   Social History   Socioeconomic History  . Marital status: Single    Spouse name: Not on file  . Number of children: Not on file  . Years of education: Not on file  . Highest education level: Not on file  Occupational History  . Not on file  Tobacco Use  . Smoking status: Former Smoker    Packs/day: 1.00    Years: 20.00    Pack years: 20.00    Types: Cigarettes    Quit date: 01/24/2018    Years since quitting: 2.3  . Smokeless tobacco: Never Used  Vaping Use  . Vaping Use: Never used  Substance and Sexual Activity  . Alcohol use: No  . Drug use: No  . Sexual activity: Not on file  Other Topics Concern  . Not on file  Social History Narrative  . Not on file   Social Determinants of Health   Financial Resource Strain:   . Difficulty of Paying Living Expenses: Not on file  Food Insecurity:   . Worried About Programme researcher, broadcasting/film/video in the Last Year: Not on file  . Ran Out of Food in the Last Year: Not on file  Transportation Needs:   . Lack of Transportation (Medical): Not on file  . Lack of Transportation (Non-Medical): Not on file  Physical Activity:   . Days of Exercise per Week: Not on file  . Minutes of Exercise per Session: Not on file  Stress:   . Feeling of Stress : Not on file  Social Connections:   . Frequency of Communication with Friends and Family: Not on file  . Frequency of  Social Gatherings with Friends and Family: Not on file  . Attends Religious Services: Not on file  . Active Member of Clubs or Organizations: Not on file  . Attends Banker Meetings: Not on file  . Marital Status: Not on file  Intimate Partner Violence:   . Fear of Current or Ex-Partner: Not on file  . Emotionally Abused: Not on file  . Physically Abused: Not on file  . Sexually Abused: Not on file    Family History:    Family History  Problem Relation Age of Onset  . Heart attack Father        Initial MI age 15, deceased in his 85's from a massive MI  . Heart attack Maternal Aunt 55  . Prostate cancer Maternal Grandfather   . Kidney cancer  Neg Hx   . Bladder Cancer Neg Hx      ROS:  Please see the history of present illness.  Review of Systems  Constitutional: Negative.   HENT: Negative.   Respiratory: Positive for shortness of breath.   Cardiovascular: Negative.   Gastrointestinal: Negative.   Musculoskeletal: Negative.        Flank pain, back pain  Neurological: Negative.   Psychiatric/Behavioral: Negative.   All other systems reviewed and are negative.   Physical Exam/Data:   Vitals:   05/27/20 0456 05/27/20 0735 05/27/20 0900 05/27/20 1119  BP: (!) 137/91 134/87 135/81 140/85  Pulse: 93 (!) 103 86 (!) 57  Resp: 16 18 18 17   Temp: 98.1 F (36.7 C) 97.6 F (36.4 C) (!) 96.4 F (35.8 C) 98.3 F (36.8 C)  TempSrc: Oral Oral  Oral  SpO2: 100% 100% 100% 100%  Weight:   99.8 kg   Height:   5\' 10"  (1.778 m)     Intake/Output Summary (Last 24 hours) at 05/27/2020 1531 Last data filed at 05/27/2020 1427 Gross per 24 hour  Intake 240 ml  Output --  Net 240 ml   Last 3 Weights 05/27/2020 05/23/2020 05/22/2020  Weight (lbs) 220 lb 214 lb 1.1 oz 220 lb  Weight (kg) 99.791 kg 97.1 kg 99.791 kg     Body mass index is 31.57 kg/m.  General:  Well nourished, well developed, in no acute distress HEENT: normal Lymph: no adenopathy Neck: no  JVD Endocrine:  No thryomegaly Vascular: No carotid bruits; FA pulses 2+ bilaterally without bruits  Cardiac: Rapid, irregularly irregular  no murmur  Lungs:  clear to auscultation bilaterally, no wheezing, rhonchi or rales  Abd: soft, nontender, no hepatomegaly  Ext: no edema Musculoskeletal:  No deformities, BUE and BLE strength normal and equal Skin: warm and dry  Neuro:  CNs 2-12 intact, no focal abnormalities noted Psych:  Normal affect   EKG:  The EKG was personally reviewed and demonstrates:   Atrial fibrillation rate 102 bpm Telemetry:  Telemetry was personally reviewed and demonstrates:   Atrial fibrillation rate 135 bpm  Relevant CV Studies: Echocardiogram Ejection fraction 55% No significant valve disease  Laboratory Data:  High Sensitivity Troponin:   Recent Labs  Lab 05/07/20 2149  TROPONINIHS 11     Chemistry Recent Labs  Lab 05/25/20 0647 05/26/20 0502 05/27/20 0450  NA 136 138 137  K 4.2 4.0 4.0  CL 103 103 103  CO2 24 24 25   GLUCOSE 114* 94 87  BUN 17 12 14   CREATININE 0.87 0.74 0.73  CALCIUM 9.6 9.6 10.0  GFRNONAA >60 >60 >60  ANIONGAP 9 11 9     Recent Labs  Lab 05/24/20 0520 05/25/20 0647 05/26/20 0502  PROT 7.1 6.9 6.9  ALBUMIN 4.0 3.8 3.9  AST 22 20 41  ALT 27 26 52*  ALKPHOS 54 57 50  BILITOT 0.9 1.1 1.4*   Hematology Recent Labs  Lab 05/22/20 1356 05/24/20 0520 05/27/20 0450  WBC 14.9* 8.0 7.6  RBC 5.12 4.95 5.14  HGB 15.9 15.3 16.1  HCT 45.2 45.4 46.3  MCV 88.3 91.7 90.1  MCH 31.1 30.9 31.3  MCHC 35.2 33.7 34.8  RDW 12.1 12.3 11.9  PLT 214 183 192   BNPNo results for input(s): BNP, PROBNP in the last 168 hours.  DDimer No results for input(s): DDIMER in the last 168 hours.   Radiology/Studies:  DG Abd 1 View  Result Date: 05/27/2020 CLINICAL DATA:  Left-sided ureteral  calculus EXAM: ABDOMEN - 1 VIEW COMPARISON:  May 24, 2020 FINDINGS: Calcification again noted to the left of L3-4 measuring 0.9 x 0.9 cm.  Tiny presumed phleboliths noted in the pelvis, stable. Moderate stool noted in colon. No bowel dilatation or air-fluid level to suggest bowel obstruction. No free air. Lung bases clear. IMPRESSION: Persistent 0.9 x 0.9 cm apparent ureteral calculus at the L3-4 level. No new calcifications. No bowel obstruction or free air. Electronically Signed   By: Bretta Bang III M.D.   On: 05/27/2020 08:56   DG Abd 1 View  Result Date: 05/24/2020 CLINICAL DATA:  Follow-up left ureteral calculus. EXAM: ABDOMEN - 1 VIEW COMPARISON:  CT scan 05/22/2020 FINDINGS: Persistent left ureteral calculus noted at the level of the L3 transverse process. No definite renal or bladder calculi. The lung bases are clear. The bowel gas pattern is unremarkable. The bony structures are intact IMPRESSION: Persistent left ureteral calculus. Electronically Signed   By: Rudie Meyer M.D.   On: 05/24/2020 14:55   ECHOCARDIOGRAM COMPLETE  Result Date: 05/27/2020    ECHOCARDIOGRAM REPORT   Patient Name:   LAMOYNE HESSEL Date of Exam: 05/27/2020 Medical Rec #:  [id number]           Height:       70.0 in Accession #:    [id number]          Weight:       220.0 lb Date of Birth:  [birth year]          BSA:          2.174 m Patient Age:    54 years            BP:           140/85 mmHg Patient Gender: M                   HR:           57 bpm. Exam Location:  [facility] Procedure: 2D Echo, Cardiac Doppler and Color Doppler Indications:     Atrial Fibrillation 427.31  History:         Patient has prior history of Echocardiogram examinations, most                  recent 10/01/2015. Arrythmias:Atrial Fibrillation. Headache.  Sonographer:     Cristela Blue RDCS (AE) Referring Phys:  [id number] Antonieta Iba Diagnosing Phys: Julien Nordmann MD  Sonographer Comments: Suboptimal apical window. IMPRESSIONS  1. Left ventricular ejection fraction, by estimation, is 55 %. The left ventricle has normal function. The left ventricle has no regional wall motion  abnormalities. There is moderate left ventricular hypertrophy.  2. Right ventricular systolic function is normal. The right ventricular size is normal.  3. The mitral valve is normal in structure. No evidence of mitral valve regurgitation. No evidence of mitral stenosis.  4. Left atrial size was mildly dilated.  5. Rhythm is atrial fibrillation/flutter FINDINGS  Left Ventricle: Left ventricular ejection fraction, by estimation, is 55 to 60%. The left ventricle has normal function. The left ventricle has no regional wall motion abnormalities. The left ventricular internal cavity size was normal in size. There is  moderate left ventricular hypertrophy. Left ventricular diastolic parameters are indeterminate. Right Ventricle: The right ventricular size is normal. No increase in right ventricular wall thickness. Right ventricular systolic function is normal. Left Atrium: Left atrial size was mildly dilated. Right Atrium: Right atrial size was normal in size. Pericardium:  There is no evidence of pericardial effusion. Mitral Valve: The mitral valve is normal in structure. No evidence of mitral valve regurgitation. No evidence of mitral valve stenosis. Tricuspid Valve: The tricuspid valve is normal in structure. Tricuspid valve regurgitation is not demonstrated. No evidence of tricuspid stenosis. Aortic Valve: The aortic valve is normal in structure. Aortic valve regurgitation is not visualized. No aortic stenosis is present. Aortic valve mean gradient measures 4.5 mmHg. Aortic valve peak gradient measures 8.6 mmHg. Aortic valve area, by VTI measures 2.32 cm. Pulmonic Valve: The pulmonic valve was normal in structure. Pulmonic valve regurgitation is not visualized. No evidence of pulmonic stenosis. Aorta: The aortic root is normal in size and structure. Venous: The inferior vena cava is normal in size with greater than 50% respiratory variability, suggesting right atrial pressure of 3 mmHg. IAS/Shunts: No atrial level  shunt detected by color flow Doppler.  LEFT VENTRICLE PLAX 2D LVIDd:         4.59 cm LVIDs:         2.83 cm LV PW:         1.41 cm LV IVS:        1.27 cm LVOT diam:     2.10 cm LV SV:         42 LV SV Index:   19 LVOT Area:     3.46 cm  RIGHT VENTRICLE RV Basal diam:  3.57 cm RV S prime:     20.50 cm/s TAPSE (M-mode): 5.2 cm LEFT ATRIUM             Index       RIGHT ATRIUM           Index LA diam:        4.60 cm 2.12 cm/m  RA Area:     18.60 cm LA Vol (A2C):   66.6 ml 30.64 ml/m RA Volume:   47.40 ml  21.81 ml/m LA Vol (A4C):   56.5 ml 25.99 ml/m LA Biplane Vol: 63.6 ml 29.26 ml/m  AORTIC VALVE                   PULMONIC VALVE AV Area (Vmax):    2.28 cm    PV Vmax:        0.73 m/s AV Area (Vmean):   2.29 cm    PV Peak grad:   2.1 mmHg AV Area (VTI):     2.32 cm    RVOT Peak grad: 3 mmHg AV Vmax:           147.00 cm/s AV Vmean:          91.350 cm/s AV VTI:            0.180 m AV Peak Grad:      8.6 mmHg AV Mean Grad:      4.5 mmHg LVOT Vmax:         96.80 cm/s LVOT Vmean:        60.400 cm/s LVOT VTI:          0.120 m LVOT/AV VTI ratio: 0.67  AORTA Ao Root diam: 3.20 cm MITRAL VALVE               TRICUSPID VALVE MV Area (PHT): 4.49 cm    TR Peak grad:   6.6 mmHg MV Decel Time: 169 msec    TR Vmax:        128.00 cm/s MV E velocity: 88.80 cm/s  SHUNTS                            Systemic VTI:  0.12 m                            Systemic Diam: 2.10 cm Julien Nordmann MD Electronically signed by Julien Nordmann MD Signature Date/Time: 05/27/2020/12:59:50 PM    Final      Assessment and Plan:   Atrial fibrillation with RVR Medication noncompliance, Long history of atrial fibrillation, likely now permanent Dating back to 2017, noncompliance with medication and with doctor visits -For rate control recommend we restart metoprolol tartrate 50 twice daily -Order placed to put him on telemetry -Echo with normal ejection fraction -We will hold off on starting anticoagulation. CHADS VASC  1,   He has indicated he does not want to take anticoagulation   Essential hypertension We will hold hydralazine for now Restart metoprolol tartrate 50 twice daily  Left ureteral calculus-refractory pain with large obstructing stone.   Lithotripsy canceled today Followed by urology Recommended to proceed with left ureteroscopy, laser lithotripsy and stent placement -Procedure discussed with him in detail   Long discussion with him concerning prior atrial fibrillation history, reluctance to see physicians, reluctance to take medications Discussed symptoms of shortness of breath, role of atrial fibrillation his symptoms  Total encounter time more than 110 minutes  Greater than 50% was spent in counseling and coordination of care with the patient    For questions or updates, please contact [facility] Please consult [email or web address] for contact info under    Signed, Julien Nordmann, MD  05/27/2020 3:31 PM

## 2020-05-27 NOTE — H&P (View-Only) (Signed)
05/27/20  Subjective:  continues to have intermittent pain from obstructing stone requiring narcotics and ongoing admission.  He is brought down to the lithotripter machine this morning after receiving premeds including Zofran, Valium and Benadryl.  Upon being hooked up to the monitor, he was noted to have heart rates ranging anywhere from 1 15-1 35 and one rhythm strip, consistent with new onset A. fib.  He did endorse that he has been diagnosed with this for 5 years ago but stopped taking meds.  Exam: Mild left CVA tenderness  Assessment and plan:  1.  New onset A. Fib-tachycardia with evidence of new onset A. fib.  Recommend cardiology consult, communicated this recommendation with Dr. Sherryll Burger.  Unable to treat with ESWL today as result of his tachyarrhythmia.  2.  Left ureteral calculus-refractory pain with large obstructing stone.  As previously, strongly recommend left ureteroscopy, laser lithotripsy and stent placement.  We will arrange for this tomorrow once has been cleared from a cardiac standpoint.  Please make him n.p.o. midnight if clear.  Okay to anticoagulate.  Risk-benefit is discussed.  All questions answered.  I also discussed this case with my partner, Dr. Richardo Hanks who is agreeable to perform the procedure tomorrow.  [name], MD

## 2020-05-28 ENCOUNTER — Inpatient Hospital Stay: Payer: Self-pay | Admitting: Certified Registered"

## 2020-05-28 ENCOUNTER — Encounter
Admission: EM | Disposition: A | Discharge status: Home / Self Care | Payer: Self-pay | Source: Home / Self Care | Attending: Internal Medicine

## 2020-05-28 ENCOUNTER — Ambulatory Visit: Admission: RE | Admit: 2020-05-28 | Payer: Self-pay | Source: Home / Self Care | Admitting: Urology

## 2020-05-28 ENCOUNTER — Inpatient Hospital Stay: Payer: Self-pay

## 2020-05-28 ENCOUNTER — Encounter: Payer: Self-pay | Admitting: Internal Medicine

## 2020-05-28 ENCOUNTER — Telehealth: Payer: Self-pay | Admitting: Urology

## 2020-05-28 DIAGNOSIS — I4821 Permanent atrial fibrillation: Secondary | ICD-10-CM

## 2020-05-28 DIAGNOSIS — Z9114 Patient's other noncompliance with medication regimen: Secondary | ICD-10-CM

## 2020-05-28 DIAGNOSIS — R0602 Shortness of breath: Secondary | ICD-10-CM

## 2020-05-28 DIAGNOSIS — N202 Calculus of kidney with calculus of ureter: Secondary | ICD-10-CM

## 2020-05-28 HISTORY — PX: URETEROSCOPY WITH HOLMIUM LASER LITHOTRIPSY: SHX6645

## 2020-05-28 LAB — CBC
HCT: 47.1 % (ref 39.0–52.0)
Hemoglobin: 16 g/dL (ref 13.0–17.0)
MCH: 30.9 pg (ref 26.0–34.0)
MCHC: 34 g/dL (ref 30.0–36.0)
MCV: 91.1 fL (ref 80.0–100.0)
Platelets: 198 10*3/uL (ref 150–400)
RBC: 5.17 MIL/uL (ref 4.22–5.81)
RDW: 12 % (ref 11.5–15.5)
WBC: 8.5 10*3/uL (ref 4.0–10.5)
nRBC: 0 % (ref 0.0–0.2)

## 2020-05-28 LAB — BASIC METABOLIC PANEL
Anion gap: 11 (ref 5–15)
BUN: 19 mg/dL (ref 6–20)
CO2: 19 mmol/L — ABNORMAL LOW (ref 22–32)
Calcium: 9.9 mg/dL (ref 8.9–10.3)
Chloride: 106 mmol/L (ref 98–111)
Creatinine, Ser: 0.83 mg/dL (ref 0.61–1.24)
GFR, Estimated: >60 mL/min (ref >60)
Glucose, Bld: 87 mg/dL (ref 70–99)
Potassium: 4.5 mmol/L (ref 3.5–5.1)
Sodium: 136 mmol/L (ref 135–145)

## 2020-05-28 LAB — GLUCOSE, CAPILLARY
Glucose-Capillary: 84 mg/dL (ref 70–99)
Glucose-Capillary: 130 mg/dL — ABNORMAL HIGH (ref 70–99)

## 2020-05-28 SURGERY — URETEROSCOPY, WITH LITHOTRIPSY USING HOLMIUM LASER
Anesthesia: General | Laterality: Left

## 2020-05-28 MED ORDER — MEPERIDINE HCL 50 MG/ML IJ SOLN: 6.2500 mg | INTRAMUSCULAR | Status: DC | PRN

## 2020-05-28 MED ORDER — ALBUMIN HUMAN 5 % IV SOLN
INTRAVENOUS | Status: AC
  Filled 2020-05-28: qty 500

## 2020-05-28 MED ORDER — FENTANYL CITRATE (PF) 100 MCG/2ML IJ SOLN
INTRAMUSCULAR | Status: DC | PRN
  Administered 2020-05-28 (×2): 50 ug via INTRAVENOUS

## 2020-05-28 MED ORDER — HYDROMORPHONE HCL 1 MG/ML IJ SOLN
INTRAMUSCULAR | Status: AC
  Administered 2020-05-28: 0.5 mg via INTRAVENOUS
  Filled 2020-05-28: qty 1

## 2020-05-28 MED ORDER — LORAZEPAM 2 MG/ML IJ SOLN: 1.0000 mg | Freq: Once | INTRAMUSCULAR | Status: DC | PRN

## 2020-05-28 MED ORDER — LACTATED RINGERS IV SOLN: INTRAVENOUS | Status: DC | PRN

## 2020-05-28 MED ORDER — BELLADONNA ALKALOIDS-OPIUM 16.2-60 MG RE SUPP
RECTAL | Status: AC
  Filled 2020-05-28: qty 1

## 2020-05-28 MED ORDER — SUGAMMADEX SODIUM 200 MG/2ML IV SOLN
INTRAVENOUS | Status: DC | PRN
  Administered 2020-05-28: 200 mg via INTRAVENOUS

## 2020-05-28 MED ORDER — CEFAZOLIN SODIUM-DEXTROSE 2-3 GM-%(50ML) IV SOLR
INTRAVENOUS | Status: DC | PRN
  Administered 2020-05-28: 2 g via INTRAVENOUS

## 2020-05-28 MED ORDER — FAMOTIDINE 20 MG PO TABS
ORAL_TABLET | ORAL | Status: AC
  Filled 2020-05-28: qty 1

## 2020-05-28 MED ORDER — OXYCODONE HCL 5 MG/5ML PO SOLN: 5.0000 mg | Freq: Once | ORAL | Status: DC | PRN

## 2020-05-28 MED ORDER — KETOROLAC TROMETHAMINE 15 MG/ML IJ SOLN
INTRAMUSCULAR | Status: DC | PRN
  Administered 2020-05-28: 15 mg via INTRAVENOUS

## 2020-05-28 MED ORDER — VASOPRESSIN 20 UNIT/ML IV SOLN
INTRAVENOUS | Status: DC | PRN
  Administered 2020-05-28: 1 [IU] via INTRAVENOUS
  Administered 2020-05-28: 2 [IU] via INTRAVENOUS
  Administered 2020-05-28: 1 [IU] via INTRAVENOUS

## 2020-05-28 MED ORDER — DEXAMETHASONE SODIUM PHOSPHATE 10 MG/ML IJ SOLN
INTRAMUSCULAR | Status: DC | PRN
  Administered 2020-05-28: 5 mg via INTRAVENOUS

## 2020-05-28 MED ORDER — PROPOFOL 10 MG/ML IV BOLUS
INTRAVENOUS | Status: DC | PRN
  Administered 2020-05-28: 180 mg via INTRAVENOUS

## 2020-05-28 MED ORDER — EPHEDRINE SULFATE 50 MG/ML IJ SOLN
INTRAMUSCULAR | Status: DC | PRN
  Administered 2020-05-28: 10 mg via INTRAVENOUS

## 2020-05-28 MED ORDER — BELLADONNA ALKALOIDS-OPIUM 16.2-60 MG RE SUPP
RECTAL | Status: DC | PRN
  Administered 2020-05-28: 1 via RECTAL

## 2020-05-28 MED ORDER — ROCURONIUM BROMIDE 100 MG/10ML IV SOLN
INTRAVENOUS | Status: DC | PRN
  Administered 2020-05-28: 50 mg via INTRAVENOUS

## 2020-05-28 MED ORDER — IOTHALAMATE MEGLUMINE 43 % IV SOLN
INTRAVENOUS | Status: DC | PRN
  Administered 2020-05-28: 10 mL

## 2020-05-28 MED ORDER — OXYCODONE HCL 5 MG PO TABS: 5.0000 mg | ORAL_TABLET | Freq: Once | ORAL | Status: DC | PRN

## 2020-05-28 MED ORDER — LIDOCAINE HCL (CARDIAC) PF 100 MG/5ML IV SOSY
PREFILLED_SYRINGE | INTRAVENOUS | Status: DC | PRN
  Administered 2020-05-28: 100 mg via INTRAVENOUS

## 2020-05-28 MED ORDER — HYDROMORPHONE HCL 1 MG/ML IJ SOLN
0.2500 mg | INTRAMUSCULAR | Status: DC | PRN
  Administered 2020-05-28 (×2): 0.25 mg via INTRAVENOUS

## 2020-05-28 MED ORDER — SIMETHICONE 80 MG PO CHEW
80.0000 mg | CHEWABLE_TABLET | Freq: Once | ORAL | Status: AC
  Administered 2020-05-28: 80 mg via ORAL
  Filled 2020-05-28 (×3): qty 1

## 2020-05-28 MED ORDER — FAMOTIDINE 20 MG PO TABS
20.0000 mg | ORAL_TABLET | Freq: Once | ORAL | Status: AC
  Administered 2020-05-28: 20 mg via ORAL

## 2020-05-28 MED ORDER — OXYBUTYNIN CHLORIDE ER 5 MG PO TB24
10.0000 mg | ORAL_TABLET | Freq: Every day | ORAL | Status: DC
  Administered 2020-05-28 – 2020-05-31 (×4): 10 mg via ORAL
  Filled 2020-05-28 (×4): qty 2

## 2020-05-28 MED ORDER — ONDANSETRON HCL 4 MG/2ML IJ SOLN
INTRAMUSCULAR | Status: DC | PRN
  Administered 2020-05-28: 4 mg via INTRAVENOUS

## 2020-05-28 MED ORDER — SENNOSIDES-DOCUSATE SODIUM 8.6-50 MG PO TABS
2.0000 | ORAL_TABLET | Freq: Two times a day (BID) | ORAL | Status: DC
  Administered 2020-05-28 – 2020-06-01 (×8): 2 via ORAL
  Filled 2020-05-28 (×8): qty 2

## 2020-05-28 MED ORDER — PROMETHAZINE HCL 25 MG/ML IJ SOLN: 6.2500 mg | INTRAMUSCULAR | Status: DC | PRN

## 2020-05-28 MED ORDER — DROPERIDOL 2.5 MG/ML IJ SOLN
0.6250 mg | Freq: Once | INTRAMUSCULAR | Status: DC | PRN
  Filled 2020-05-28: qty 2

## 2020-05-28 MED ORDER — PHENYLEPHRINE HCL (PRESSORS) 10 MG/ML IV SOLN
INTRAVENOUS | Status: DC | PRN
  Administered 2020-05-28: 200 ug via INTRAVENOUS
  Administered 2020-05-28: 100 ug via INTRAVENOUS
  Administered 2020-05-28 (×2): 200 ug via INTRAVENOUS

## 2020-05-28 MED ORDER — MIDAZOLAM HCL 2 MG/2ML IJ SOLN
INTRAMUSCULAR | Status: DC | PRN
  Administered 2020-05-28: 2 mg via INTRAVENOUS

## 2020-05-28 MED ORDER — OXYBUTYNIN CHLORIDE ER 5 MG PO TB24: 5.0000 mg | ORAL_TABLET | Freq: Every day | ORAL | Status: DC

## 2020-05-28 SURGICAL SUPPLY — 35 items
BAG DRAIN CYSTO-URO LG1000N (MISCELLANEOUS) ×3 IMPLANT
BASKET ZERO TIP 1.9FR (BASKET) IMPLANT
BULB IRRIG PATHFIND (MISCELLANEOUS) IMPLANT
CATH URET FLEX-TIP 2 LUMEN 10F (CATHETERS) ×3 IMPLANT
CATH URETL 5X70 OPEN END (CATHETERS) ×3 IMPLANT
CNTNR SPEC 2.5X3XGRAD LEK (MISCELLANEOUS)
CONRAY 43 FOR UROLOGY 50M (MISCELLANEOUS) ×3 IMPLANT
CONT SPEC 4OZ STER OR WHT (MISCELLANEOUS)
CONT SPEC 4OZ STRL OR WHT (MISCELLANEOUS)
CONTAINER SPEC 2.5X3XGRAD LEK (MISCELLANEOUS) IMPLANT
DRAPE UTILITY 15X26 TOWEL STRL (DRAPES) ×3 IMPLANT
FIBER LASER TRAC TIP (UROLOGICAL SUPPLIES) IMPLANT
GLOVE BIOGEL PI IND STRL 7.5 (GLOVE) ×1 IMPLANT
GLOVE BIOGEL PI INDICATOR 7.5 (GLOVE) ×2
GOWN STRL REUS W/ TWL LRG LVL3 (GOWN DISPOSABLE) ×1 IMPLANT
GOWN STRL REUS W/ TWL XL LVL3 (GOWN DISPOSABLE) ×1 IMPLANT
GOWN STRL REUS W/TWL LRG LVL3 (GOWN DISPOSABLE) ×3
GOWN STRL REUS W/TWL XL LVL3 (GOWN DISPOSABLE) ×3
GUIDEWIRE GREEN .038 145CM (MISCELLANEOUS) ×3 IMPLANT
GUIDEWIRE STR DUAL SENSOR (WIRE) ×3 IMPLANT
INFUSOR MANOMETER BAG 3000ML (MISCELLANEOUS) ×3 IMPLANT
INTRODUCER DILATOR DOUBLE (INTRODUCER) IMPLANT
KIT TURNOVER CYSTO (KITS) ×3 IMPLANT
MANIFOLD NEPTUNE II (INSTRUMENTS) ×3 IMPLANT
PACK CYSTO AR (MISCELLANEOUS) ×3 IMPLANT
SET CYSTO W/LG BORE CLAMP LF (SET/KITS/TRAYS/PACK) ×3 IMPLANT
SHEATH URETERAL 12FRX35CM (MISCELLANEOUS) IMPLANT
SOL .9 NS 3000ML IRR  AL (IV SOLUTION) ×3
SOL .9 NS 3000ML IRR UROMATIC (IV SOLUTION) ×1 IMPLANT
STENT URET 6FRX28 CONTOUR (STENTS) ×3 IMPLANT
SURGILUBE 2OZ TUBE FLIPTOP (MISCELLANEOUS) ×3 IMPLANT
TRACTIP FLEXIVA PULSE ID 200 (Laser) ×3 IMPLANT
TUBING ART PRESS 48 MALE/FEM (TUBING) IMPLANT
VALVE UROSEAL ADJ ENDO (VALVE) IMPLANT
WATER STERILE IRR 1000ML POUR (IV SOLUTION) ×3 IMPLANT

## 2020-05-28 NOTE — Progress Notes (Signed)
CSW informed by MD that patient is homeless and needs resources. CSW spoke to patient. 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 CSW providing [facility] [employer]. Will make referral to [facility] for PCP/medication needs as patient was agreeable to this.   Informed patient of [facility] homeless shelter in [place]. Patient went on about "that place is corrupt" and reasons why he needs someone to call for him. CSW informed patient that typically patients have to call themselves and do an application before going to this shelter. Patient reported they will not believe him. CSW called [employer] and left a voicemail for [employer] (extension [phone]) per patient's request, informed Emelia Salisbury patient is at the hospital and plan for discharge tomorrow. Informed her that patient reports he is homeless and will need to come to the shelter at discharge tomorrow if possible. CSW updated patient and asked him to call [facility] to complete application. Patient verbalized understanding and reported he will call.  Alfonso Ramus, [place] [phone]

## 2020-05-28 NOTE — Telephone Encounter (Signed)
Follow up for stent removal made for pt.  He also wants to know when he can go back to work.

## 2020-05-28 NOTE — Transfer of Care (Signed)
Immediate Anesthesia Transfer of Care Note  Patient: Rickey Lane  Procedure(s) Performed: URETEROSCOPY WITH HOLMIUM LASER LITHOTRIPSY (Left )  Patient Location: PACU  Anesthesia Type:General  Level of Consciousness: awake  Airway & Oxygen Therapy: Patient Spontanous Breathing and Patient connected to face mask oxygen  Post-op Assessment: Report given to RN and Post -op Vital signs reviewed and stable  Post vital signs: Reviewed and stable  Last Vitals:  Vitals Value Taken Time  BP    Temp    Pulse    Resp    SpO2      Last Pain:  Vitals:   05/28/20 0906  TempSrc: Tympanic  PainSc: 0-No pain      Patients Stated Pain Goal: 5 (05/24/20 2105)  Complications: No complications documented.

## 2020-05-28 NOTE — Progress Notes (Signed)
Patient has not had pain improve despite pain medications and describes as lower abdomen pain and that it feels like gas pains and he needs to pass gas. Discussed with dr tunnel and new medication ordered.

## 2020-05-28 NOTE — Anesthesia Postprocedure Evaluation (Signed)
Anesthesia Post Note  Patient: Rickey Lane  Procedure(s) Performed: URETEROSCOPY WITH HOLMIUM LASER LITHOTRIPSY (Left )  Patient location during evaluation: PACU Anesthesia Type: General Level of consciousness: awake Pain management: pain level controlled Vital Signs Assessment: post-procedure vital signs reviewed and stable Respiratory status: spontaneous breathing Cardiovascular status: blood pressure returned to baseline Postop Assessment: no apparent nausea or vomiting Anesthetic complications: no   No complications documented.   Last Vitals:  Vitals:   05/28/20 1406 05/28/20 1418  BP: (!) 117/96 (!) 129/93  Pulse: 80 77  Resp: (!) 24 17  Temp: 36.6 C 36.6 C  SpO2: 98% 99%    Last Pain:  Vitals:   05/28/20 1406  TempSrc:   PainSc: 7                  Emilio Math

## 2020-05-28 NOTE — Op Note (Signed)
Date of procedure: 05/28/20  Preoperative diagnosis:  1. Left proximal ureteral stone 2. Small left renal stones  Postoperative diagnosis:  1. Same  Procedure: 1. Left ureteroscopy, laser lithotripsy, retrograde pyelogram with intraoperative interpretation, left ureteral stent placement  Surgeon: Nickolas Madrid, MD  Anesthesia: General  Complications: None  Intraoperative findings:  1.  High bladder neck, normal cystoscopy 2.  Left sided stones dusted, and stent placement  EBL: Minimal  Specimens: None  Drains: Left 6 French by 28 cm ureteral stent  Indication: Rickey Lane is a 54 y.o. patient with 1 cm left ureteral stone and poorly controlled renal colic.  He was not a candidate for shockwave lithotripsy, and opted for ureteroscopy.  After reviewing the management options for treatment, they elected to proceed with the above surgical procedure(s). We have discussed the potential benefits and risks of the procedure, side effects of the proposed treatment, the likelihood of the patient achieving the goals of the procedure, and any potential problems that might occur during the procedure or recuperation. Informed consent has been obtained.  Description of procedure:  The patient was taken to the operating room and general anesthesia was induced. SCDs were placed for DVT prophylaxis. The patient was placed in the dorsal lithotomy position, prepped and draped in the usual sterile fashion, and preoperative antibiotics(Ancef) were administered. A preoperative time-out was performed.   A 21 French rigid cystoscope was used to intubate the urethra and a normal-appearing urethra was followed proximally into the bladder.  There is a high bladder neck, and thorough cystoscopy was performed and the bladder was grossly normal.  A sensor wire advanced easily into the left ureteral orifice and passed alongside the proximal stone up into the kidney under fluoroscopic vision.  I attempted to  add a second safety wire using a dual-lumen ureteral access catheter, but the catheter met resistance at the ureteral orifice.  I attempted to pass the single channel digital flexible ureteroscope, but again met resistance at the ureteral orifice.  A 5 French access catheter was used to exchange the sensor wire for a Super Stiff wire, and the single channel digital flexible ureteroscope was then able to advance over the wire to the level of the stone and it was pushed into the renal pelvis.  Thorough pyeloscopy revealed a 1 cm stone in the renal pelvis, and two smaller 3 mm stones in the midpole and lower pole.  All stones were fragmented to dust using a 200 m laser fiber on settings of 0.5 J and 40 Hz.  Thorough pyeloscopy revealed no other fragments.  I was unable to completely access the upper pole secondary to the tightness of the proximal ureter and the angle, but there did not appear to be any residual fragments.  Careful pullback ureteroscopy showed a very tight and narrow proximal ureter, but no residual fragments.  A retrograde pyelogram was performed which showed no filling defects or extravasation.  A sensor wire was replaced through the scope into the renal pelvis, and the scope removed.  The rigid cystoscope was backloaded over the wire and a 6 [place] by 28 cm ureteral stent was uneventfully placed with a curl in the renal pelvis, as well as under direct vision the bladder.  Urine was seen to drain through the side ports of the stent.  The bladder was drained and a belladonna suppository was placed.  Disposition: Stable to PACU  Plan: Stent removal in 10 to 14 days Okay to discharge from urology perspective Flomax  and oxybutynin daily for stent pain  Nickolas Madrid, MD

## 2020-05-28 NOTE — Anesthesia Procedure Notes (Signed)
Procedure Name: Intubation Date/Time: 05/28/2020 12:08 PM Performed by: Clyde Lundborg, CRNA Pre-anesthesia Checklist: Patient identified, Emergency Drugs available, Suction available and Patient being monitored Patient Re-evaluated:Patient Re-evaluated prior to induction Oxygen Delivery Method: Circle system utilized Preoxygenation: Pre-oxygenation with 100% oxygen Induction Type: IV induction Ventilation: Mask ventilation without difficulty Laryngoscope Size: McGraph and 4 Grade View: Grade I Tube type: Oral Number of attempts: 1 Airway Equipment and Method: Stylet and Video-laryngoscopy Placement Confirmation: ETT inserted through vocal cords under direct vision,  positive ETCO2,  breath sounds checked- equal and bilateral and CO2 detector Secured at: 21 cm Tube secured with: Tape Dental Injury: Teeth and Oropharynx as per pre-operative assessment

## 2020-05-28 NOTE — Progress Notes (Signed)
   05/28/20 1302  Clinical Encounter Type  Visited With Patient  Visit Type Initial;Spiritual support  Referral From Nurse  Consult/Referral To Chaplain  Chaplain responded to an OR for prayer. When chaplain arrived at room, Pt expressed what he was concerned about and chaplain prayed accordingly. After praying chaplain told Pt chaplains availability.

## 2020-05-28 NOTE — Anesthesia Preprocedure Evaluation (Signed)
Anesthesia Evaluation  Patient identified by MRN, date of birth, ID band Patient awake    Reviewed: Allergy & Precautions, NPO status , Patient's Chart, lab work & pertinent test results  Airway Mallampati: II       Dental no notable dental hx.    Pulmonary sleep apnea , former smoker,    Pulmonary exam normal breath sounds clear to auscultation       Cardiovascular hypertension, Normal cardiovascular exam Rhythm:Irregular Rate:Normal     Neuro/Psych  Headaches, negative psych ROS   GI/Hepatic Neg liver ROS, GERD  ,  Endo/Other  negative endocrine ROS  Renal/GU Renal disease  negative genitourinary   Musculoskeletal negative musculoskeletal ROS (+)   Abdominal   Peds negative pediatric ROS (+)  Hematology negative hematology ROS (+)   Anesthesia Other Findings Past Medical History: No date: Atrial fibrillation (HCC)     Comment:  a. initially diagnosed in 09/2015. Reported a history of              "irregular HR" for 3+ years No date: GERD (gastroesophageal reflux disease) No date: Headache No date: Kidney stone No date: Sleep apnea   Reproductive/Obstetrics negative OB ROS                             Anesthesia Physical Anesthesia Plan  ASA: III  Anesthesia Plan: General   Post-op Pain Management:    Induction: Intravenous  PONV Risk Score and Plan: 2 and Ondansetron and Dexamethasone  Airway Management Planned: Oral ETT  Additional Equipment:   Intra-op Plan:   Post-operative Plan: Extubation in OR  Informed Consent: I have reviewed the patients History and Physical, chart, labs and discussed the procedure including the risks, benefits and alternatives for the proposed anesthesia with the patient or authorized representative who has indicated his/her understanding and acceptance.     Dental advisory given  Plan Discussed with: CRNA, Anesthesiologist and  Surgeon  Anesthesia Plan Comments: (Patient has GERD, controlled with meds.  Did not have medication this AM, ordered Famotidine 20mg  IV.)        Anesthesia Quick Evaluation

## 2020-05-28 NOTE — Progress Notes (Signed)
Progress Note  Patient Name: Rickey Lane Date of Encounter: 05/28/2020  Primary Cardiologist: Kirke Corin  Subjective   No chest or dyspnea. He remains in Afib with controlled ventricular response.   Inpatient Medications    Scheduled Meds: . metoprolol tartrate  50 mg Oral BID  . pantoprazole (PROTONIX) IV  40 mg Intravenous Q12H  . tamsulosin  0.4 mg Oral Daily   Continuous Infusions: . sodium chloride 75 mL/hr at 05/28/20 0135   PRN Meds: acetaminophen, HYDROmorphone (DILAUDID) injection, hydrOXYzine, ondansetron **OR** ondansetron (ZOFRAN) IV, senna-docusate, traMADol   Vital Signs    Vitals:   05/27/20 1611 05/27/20 2050 05/28/20 0352 05/28/20 0742  BP: 111/71 111/68 116/66 120/88  Pulse: 90 88 78 86  Resp: 17 16 16 16   Temp: 98 F (36.7 C) 98.2 F (36.8 C) 98 F (36.7 C) 98.4 F (36.9 C)  TempSrc:   Oral   SpO2: 97% 99% 100% 100%  Weight:      Height:        Intake/Output Summary (Last 24 hours) at 05/28/2020 0826 Last data filed at 05/27/2020 1427 Gross per 24 hour  Intake 240 ml  Output --  Net 240 ml   Filed Weights   05/22/20 1333 05/23/20 0050 05/27/20 0900  Weight: 99.8 kg 97.1 kg 99.8 kg    Telemetry    Afib, 80s bpm - Personally Reviewed  ECG    No new tracings - Personally Reviewed  Physical Exam   GEN: No acute distress.   Neck: No JVD. Cardiac: IRIR, no murmurs, rubs, or gallops.  Respiratory: Clear to auscultation bilaterally.  GI: Soft, nontender, non-distended.   MS: No edema; No deformity. Neuro:  Alert and oriented x 3; Nonfocal.  Psych: Normal affect.  Labs    Chemistry Recent Labs  Lab 05/24/20 0520 05/24/20 0520 05/25/20 05/27/20 05/25/20 0647 05/26/20 0502 05/27/20 0450 05/28/20 0529  NA 141   < > 136   < > 138 137 136  K 4.0   < > 4.2   < > 4.0 4.0 4.5  CL 105   < > 103   < > 103 103 106  CO2 28   < > 24   < > 24 25 19*  GLUCOSE 105*   < > 114*   < > 94 87 87  BUN 16   < > 17   < > 12 14 19    CREATININE 0.84   < > 0.87   < > 0.74 0.73 0.83  CALCIUM 9.4   < > 9.6   < > 9.6 10.0 9.9  PROT 7.1  --  6.9  --  6.9  --   --   ALBUMIN 4.0  --  3.8  --  3.9  --   --   AST 22  --  20  --  41  --   --   ALT 27  --  26  --  52*  --   --   ALKPHOS 54  --  57  --  50  --   --   BILITOT 0.9  --  1.1  --  1.4*  --   --   GFRNONAA >60   < > >60   < > >60 >60 >60  ANIONGAP 8   < > 9   < > 11 9 11    < > = values in this interval not displayed.     Hematology Recent Labs  Lab 05/24/20  6195 05/27/20 0450 05/28/20 0529  WBC 8.0 7.6 8.5  RBC 4.95 5.14 5.17  HGB 15.3 16.1 16.0  HCT 45.4 46.3 47.1  MCV 91.7 90.1 91.1  MCH 30.9 31.3 30.9  MCHC 33.7 34.8 34.0  RDW 12.3 11.9 12.0  PLT 183 192 198    Cardiac EnzymesNo results for input(s): TROPONINI in the last 168 hours. No results for input(s): TROPIPOC in the last 168 hours.   BNPNo results for input(s): BNP, PROBNP in the last 168 hours.   DDimer No results for input(s): DDIMER in the last 168 hours.   Radiology    DG Abd 1 View  Result Date: 05/27/2020 IMPRESSION: Persistent 0.9 x 0.9 cm apparent ureteral calculus at the L3-4 level. No new calcifications. No bowel obstruction or free air. Electronically Signed   By: Bretta Bang III M.D.   On: 05/27/2020 08:56    Cardiac Studies   2D echo 05/27/2020: 1. Left ventricular ejection fraction, by estimation, is 55 %. The left  ventricle has normal function. The left ventricle has no regional wall  motion abnormalities. There is moderate left ventricular hypertrophy.  2. Right ventricular systolic function is normal. The right ventricular  size is normal.  3. The mitral valve is normal in structure. No evidence of mitral valve  regurgitation. No evidence of mitral stenosis.  4. Left atrial size was mildly dilated.  5. Rhythm is atrial fibrillation/flutte1. Left ventricular ejection fraction, by estimation, is 55 %. The left  ventricle has normal function. The left  ventricle has no regional wall  motion abnormalities. There is moderate left ventricular hypertrophy.  2. Right ventricular systolic function is normal. The right ventricular  size is normal.  3. The mitral valve is normal in structure. No evidence of mitral valve  regurgitation. No evidence of mitral stenosis.  4. Left atrial size was mildly dilated.  5. Rhythm is atrial fibrillation/flutter  Patient Profile     54 y.o. male with history of permanent Afib not on OAC by his choice, OSA, medication nonadherence who was last seen in 2017 is admitted for nephrolithiasis and we are seeing for permanent Afib with RVR.  Assessment & Plan    1. Permanent Afib: -Ventricular rates are well controlled -Continue metoprolol 50 mg bid for rate control -CHADS2VASc 1 (HTN) -Historically, he has refused OAC, he would like to think on this moving forward -Hold off on starting on OAC in the perioperative timeframe -With prior medication noncompliance and duration of his Afib, no plans for rhythm control strategy   2. Preprocedure cardiac risk stratification: -He is without symptoms concerning for angina and does not appear decompensated  -Echo with low normal LVSF as above -RCRI is low risk for noncardiac procedure -[facility] activity status index > 4 METs without cardiac limitation  -He may proceed with noncardiac procedure without further testing or intervention at an overall low risk  3. HTN: -Blood pressure is well controlled -Continue metoprolol   4. Medication noncompliance: -Compliance advised   For questions or updates, please contact [facility] Please consult [email or web address] for contact info under Cardiology/STEMI.    Signed, Eula Listen, PA-C [facility] Pager: [phone] 05/28/2020, 8:26 AM

## 2020-05-28 NOTE — Interval H&P Note (Signed)
UROLOGY H&P UPDATE  Agree with prior H&P dated 05/27/20 by Dr. Apolinar Junes.  1 cm left proximal ureteral stone with hydronephrosis, noninfected urinalysis.  Unable to perform shockwave lithotripsy yesterday secondary to A. fib and tachycardia.  Cardiac: RRR Lungs: CTA bilaterally  Laterality: Left Procedure: Left ureteroscopy, laser lithotripsy, stent placement  Urine: Urinalysis 05/22/2020 greater than 50 RBCs, 0 WBCs, no bacteria, nitrite negative, no leukocytes  We specifically discussed the risks ureteroscopy including bleeding, infection/sepsis, stent related symptoms including flank pain/urgency/frequency/incontinence/dysuria, ureteral injury, inability to access stone, or need for staged or additional procedures.   Sondra Come, MD 05/28/2020

## 2020-05-28 NOTE — Progress Notes (Signed)
PROGRESS NOTE    Rickey Lane  HGD:[id number] DOB: September 12, 1965 DOA: 05/22/2020 PCP: Rickey Loveless, PA-C    Brief Narrative:  54 y.o. male with medical history significant for paroxysmal atrial fibrillation status post cardioversion in the past.  History of recurrent kidney stones in the past that required lithotripsy for treatment. Pt normally follows [facility], last seen by Rickey Cowboy, PA-C in 07/22/19. Pt was admitted for intractable pain requiring IV analgesic  Assessment & Plan:   Active Problems:   Kidney stones   * Permanent A.fib -Rate control with oral metoprolol.  No need of anticoagulation. -Appreciate cardiology input  #1.  Acute left flank pain secondary to multiple left kidney stones,  -Seen on CT scan -Status post Left ureteroscopy, laser lithotripsy, retrograde pyelogram with intraoperative interpretation, left ureteral stent placement on 12/3 by urology -Pain is much better control.  Will need outpatient follow-up with urology for stent removal in 10 to 14 days -Continue Flomax and oxybutynin daily for stent pain  #2. Mild AKI:  -Suspected prerenal + postobstructive in etiology.  -resolved with IVF hydration  #3.  Leukocytosis:  -Urinalysis not suggestive of UTI -WBC normalized, likely reactive at presentation  #4 Uncontrolled hypertension - improved continue lisinopril 5 mg p.o. daily and metoprolol 50 mg po bid.   Poor social situation - reports no home, job, insurance, poor finances... - c/s TOC -they are aware to touch base with patient as patient has been requesting for last 2 days  DVT prophylaxis: Lovenox subq Code Status: Full Family Communication: Pt in room, family not at bedside  Status is: Inpatient  The patient will require care spanning > 2 midnights and should be moved to inpatient because: Ongoing active pain requiring inpatient pain management  Dispo: The patient is from: Home              Anticipated  d/c is to: Home              Anticipated d/c date is: 1 day              Patient currently is not medically stable to d/c.  Overnight observation status post ESWL for pain control.  Patient also does not have any place to go.  He claims he is homeless.  TOC team aware    Consultants:   Urology  Cardio  Procedures:   ESWL on 12/3  Antimicrobials: Anti-infectives (From admission, onward)   Start     Dose/Rate Route Frequency Ordered Stop   05/27/20 0845  cephALEXin (KEFLEX) 500 MG capsule       Note to Pharmacy: Rickey Lane   : cabinet override      05/27/20 0845 05/27/20 0857      Subjective: Requesting to talk to TOC.  Also requesting chaplain.  Asked to help him as he is homeless  Objective: Vitals:   05/28/20 1325 05/28/20 1335 05/28/20 1406 05/28/20 1418  BP: (!) 131/95 140/88 (!) 117/96 (!) 129/93  Pulse: 67 84 80 77  Resp: 17 14 (!) 24 17  Temp:   97.8 F (36.6 C) 97.8 F (36.6 C)  TempSrc:      SpO2: 95% 95% 98% 99%  Weight:      Height:        Intake/Output Summary (Last 24 hours) at 05/28/2020 1503 Last data filed at 05/28/2020 1351 Gross per 24 hour  Intake 290 ml  Output --  Net 290 ml   Filed Weights   05/23/20 0050  05/27/20 0900 05/28/20 0906  Weight: 97.1 kg 99.8 kg 97.5 kg    Examination: General exam: Conversant, in no acute distress Respiratory system: normal chest rise, clear, no audible wheezing Cardiovascular system: regular rhythm, s1-s2 Gastrointestinal system: Nondistended, nontender, pos BS Central nervous system: No seizures, no tremors Extremities: No cyanosis, no joint deformities Skin: No rashes, no pallor Psychiatry: Affect normal // no auditory hallucinations   Data Reviewed: I have personally reviewed following labs and imaging studies  CBC: Recent Labs  Lab 05/21/20 1605 05/22/20 1356 05/24/20 0520 05/27/20 0450 05/28/20 0529  WBC 11.5* 14.9* 8.0 7.6 8.5  HGB 16.8 15.9 15.3 16.1 16.0  HCT 47.9 45.2 45.4 46.3  47.1  MCV 89.2 88.3 91.7 90.1 91.1  PLT 249 214 183 192 198   Basic Metabolic Panel: Recent Labs  Lab 05/24/20 0520 05/25/20 0647 05/26/20 0502 05/27/20 0450 05/28/20 0529  NA 141 136 138 137 136  K 4.0 4.2 4.0 4.0 4.5  CL 105 103 103 103 106  CO2 28 24 24 25  19*  GLUCOSE 105* 114* 94 87 87  BUN 16 17 12 14 19   CREATININE 0.84 0.87 0.74 0.73 0.83  CALCIUM 9.4 9.6 9.6 10.0 9.9   GFR: Estimated Creatinine Clearance: 120.5 mL/min (by C-G formula based on SCr of 0.83 mg/dL). Liver Function Tests: Recent Labs  Lab 05/21/20 1605 05/22/20 1356 05/24/20 0520 05/25/20 0647 05/26/20 0502  AST 29 27 22 20  41  ALT 36 34 27 26 52*  ALKPHOS 68 64 54 57 50  BILITOT 1.2 1.2 0.9 1.1 1.4*  PROT 8.1 7.7 7.1 6.9 6.9  ALBUMIN 4.8 4.5 4.0 3.8 3.9   Recent Labs  Lab 05/21/20 1605 05/22/20 1356  LIPASE 35 32   CBG: Recent Labs  Lab 05/28/20 0740  GLUCAP 84     Recent Results (from the past 240 hour(s))  Resp Panel by RT-PCR (Flu A&B, Covid) Nasopharyngeal Swab     Status: None   Collection Time: 05/22/20  9:33 PM   Specimen: Nasopharyngeal Swab; Nasopharyngeal(NP) swabs in vial transport medium  Result Value Ref Range Status   SARS Coronavirus 2 by RT PCR NEGATIVE NEGATIVE Final    Comment: (NOTE) SARS-CoV-2 target nucleic acids are NOT DETECTED.  The SARS-CoV-2 RNA is generally detectable in upper respiratory specimens during the acute phase of infection. The lowest concentration of SARS-CoV-2 viral copies this assay can detect is 138 copies/mL. A negative result does not preclude SARS-Cov-2 infection and should not be used as the sole basis for treatment or other patient management decisions. A negative result may occur with  improper specimen collection/handling, submission of specimen other than nasopharyngeal swab, presence of viral mutation(s) within the areas targeted by this assay, and inadequate number of viral copies(<138 copies/mL). A negative result must be  combined with clinical observations, patient history, and epidemiological information. The expected result is Negative.  Fact Sheet for Patients:  BloggerCourse.comhttps://www.fda.gov/media/152166/download  Fact Sheet for Healthcare Providers:  SeriousBroker.ithttps://www.fda.gov/media/152162/download  This test is no t yet approved or cleared by the [place] FDA and  has been authorized for detection and/or diagnosis of SARS-CoV-2 by FDA under an Emergency Use Authorization (EUA). This EUA will remain  in effect (meaning this test can be used) for the duration of the COVID-19 declaration under Section 564(b)(1) of the Act, 21 U.S.C.section 360bbb-3(b)(1), unless the authorization is terminated  or revoked sooner.       Influenza A by PCR NEGATIVE NEGATIVE Final   Influenza B by  PCR NEGATIVE NEGATIVE Final    Comment: (NOTE) The Xpert Xpress SARS-CoV-2/FLU/RSV plus assay is intended as an aid in the diagnosis of influenza from Nasopharyngeal swab specimens and should not be used as a sole basis for treatment. Nasal washings and aspirates are unacceptable for Xpert Xpress SARS-CoV-2/FLU/RSV testing.  Fact Sheet for Patients: BloggerCourse.com  Fact Sheet for Healthcare Providers: SeriousBroker.it  This test is not yet approved or cleared by the [place] FDA and has been authorized for detection and/or diagnosis of SARS-CoV-2 by FDA under an Emergency Use Authorization (EUA). This EUA will remain in effect (meaning this test can be used) for the duration of the COVID-19 declaration under Section 564(b)(1) of the Act, 21 U.S.C. section 360bbb-3(b)(1), unless the authorization is terminated or revoked.  Performed at [facility], [address], [place], [place] [address]      Radiology Studies: DG Abd 1 View  Result Date: 05/27/2020 CLINICAL DATA:  Left-sided ureteral calculus EXAM: ABDOMEN - 1 VIEW COMPARISON:  May 24, 2020  FINDINGS: Calcification again noted to the left of L3-4 measuring 0.9 x 0.9 cm. Tiny presumed phleboliths noted in the pelvis, stable. Moderate stool noted in colon. No bowel dilatation or air-fluid level to suggest bowel obstruction. No free air. Lung bases clear. IMPRESSION: Persistent 0.9 x 0.9 cm apparent ureteral calculus at the L3-4 level. No new calcifications. No bowel obstruction or free air. Electronically Signed   By: Bretta Bang III M.D.   On: 05/27/2020 08:56   DG OR UROLOGY CYSTO IMAGE ([facility] ONLY)  Result Date: 05/28/2020 There is no interpretation for this exam.  This order is for images obtained during a surgical procedure.  Please See "Surgeries" Tab for more information regarding the procedure.   ECHOCARDIOGRAM COMPLETE  Result Date: 05/27/2020    ECHOCARDIOGRAM REPORT   Patient Name:   Rickey Lane Date of Exam: 05/27/2020 Medical Rec #:  [id number]           Height:       70.0 in Accession #:    [id number]          Weight:       220.0 lb Date of Birth:  [birth year]          BSA:          2.174 m Patient Age:    54 years            BP:           140/85 mmHg Patient Gender: M                   HR:           57 bpm. Exam Location:  [facility] Procedure: 2D Echo, Cardiac Doppler and Color Doppler Indications:     Atrial Fibrillation 427.31  History:         Patient has prior history of Echocardiogram examinations, most                  recent 10/01/2015. Arrythmias:Atrial Fibrillation. Headache.  Sonographer:     Cristela Blue RDCS (AE) Referring Phys:  [id number] Antonieta Iba Diagnosing Phys: Julien Nordmann MD  Sonographer Comments: Suboptimal apical window. IMPRESSIONS  1. Left ventricular ejection fraction, by estimation, is 55 %. The left ventricle has normal function. The left ventricle has no regional wall motion abnormalities. There is moderate left ventricular hypertrophy.  2. Right ventricular systolic function is normal. The right ventricular size is normal.  3. The mitral valve is  normal in structure. No evidence of mitral valve regurgitation. No evidence of mitral stenosis.  4. Left atrial size was mildly dilated.  5. Rhythm is atrial fibrillation/flutter FINDINGS  Left Ventricle: Left ventricular ejection fraction, by estimation, is 55 to 60%. The left ventricle has normal function. The left ventricle has no regional wall motion abnormalities. The left ventricular internal cavity size was normal in size. There is  moderate left ventricular hypertrophy. Left ventricular diastolic parameters are indeterminate. Right Ventricle: The right ventricular size is normal. No increase in right ventricular wall thickness. Right ventricular systolic function is normal. Left Atrium: Left atrial size was mildly dilated. Right Atrium: Right atrial size was normal in size. Pericardium: There is no evidence of pericardial effusion. Mitral Valve: The mitral valve is normal in structure. No evidence of mitral valve regurgitation. No evidence of mitral valve stenosis. Tricuspid Valve: The tricuspid valve is normal in structure. Tricuspid valve regurgitation is not demonstrated. No evidence of tricuspid stenosis. Aortic Valve: The aortic valve is normal in structure. Aortic valve regurgitation is not visualized. No aortic stenosis is present. Aortic valve mean gradient measures 4.5 mmHg. Aortic valve peak gradient measures 8.6 mmHg. Aortic valve area, by VTI measures 2.32 cm. Pulmonic Valve: The pulmonic valve was normal in structure. Pulmonic valve regurgitation is not visualized. No evidence of pulmonic stenosis. Aorta: The aortic root is normal in size and structure. Venous: The inferior vena cava is normal in size with greater than 50% respiratory variability, suggesting right atrial pressure of 3 mmHg. IAS/Shunts: No atrial level shunt detected by color flow Doppler.  LEFT VENTRICLE PLAX 2D LVIDd:         4.59 cm LVIDs:         2.83 cm LV PW:         1.41 cm LV IVS:        1.27 cm LVOT diam:     2.10 cm LV  SV:         42 LV SV Index:   19 LVOT Area:     3.46 cm  RIGHT VENTRICLE RV Basal diam:  3.57 cm RV S prime:     20.50 cm/s TAPSE (M-mode): 5.2 cm LEFT ATRIUM             Index       RIGHT ATRIUM           Index LA diam:        4.60 cm 2.12 cm/m  RA Area:     18.60 cm LA Vol (A2C):   66.6 ml 30.64 ml/m RA Volume:   47.40 ml  21.81 ml/m LA Vol (A4C):   56.5 ml 25.99 ml/m LA Biplane Vol: 63.6 ml 29.26 ml/m  AORTIC VALVE                   PULMONIC VALVE AV Area (Vmax):    2.28 cm    PV Vmax:        0.73 m/s AV Area (Vmean):   2.29 cm    PV Peak grad:   2.1 mmHg AV Area (VTI):     2.32 cm    RVOT Peak grad: 3 mmHg AV Vmax:           147.00 cm/s AV Vmean:          91.350 cm/s AV VTI:            0.180 m AV Peak Grad:  8.6 mmHg AV Mean Grad:      4.5 mmHg LVOT Vmax:         96.80 cm/s LVOT Vmean:        60.400 cm/s LVOT VTI:          0.120 m LVOT/AV VTI ratio: 0.67  AORTA Ao Root diam: 3.20 cm MITRAL VALVE               TRICUSPID VALVE MV Area (PHT): 4.49 cm    TR Peak grad:   6.6 mmHg MV Decel Time: 169 msec    TR Vmax:        128.00 cm/s MV E velocity: 88.80 cm/s                            SHUNTS                            Systemic VTI:  0.12 m                            Systemic Diam: 2.10 cm Julien Nordmann MD Electronically signed by Julien Nordmann MD Signature Date/Time: 05/27/2020/12:59:50 PM    Final     Scheduled Meds: . famotidine      . metoprolol tartrate  50 mg Oral BID  . pantoprazole (PROTONIX) IV  40 mg Intravenous Q12H  . tamsulosin  0.4 mg Oral Daily   Continuous Infusions: . sodium chloride 75 mL/hr at 05/28/20 0135     LOS: 5 days   Delfino Lovett, MD [facility] Pager On Amion  If 7PM-7AM, please contact night-coverage 05/28/2020, 3:03 PM

## 2020-05-29 ENCOUNTER — Encounter: Payer: Self-pay | Admitting: Urology

## 2020-05-29 DIAGNOSIS — K5900 Constipation, unspecified: Secondary | ICD-10-CM

## 2020-05-29 DIAGNOSIS — Z659 Problem related to unspecified psychosocial circumstances: Secondary | ICD-10-CM

## 2020-05-29 LAB — BASIC METABOLIC PANEL
Anion gap: 11 (ref 5–15)
BUN: 22 mg/dL — ABNORMAL HIGH (ref 6–20)
CO2: 22 mmol/L (ref 22–32)
Calcium: 10.2 mg/dL (ref 8.9–10.3)
Chloride: 104 mmol/L (ref 98–111)
Creatinine, Ser: 0.84 mg/dL (ref 0.61–1.24)
GFR, Estimated: >60 mL/min (ref >60)
Glucose, Bld: 106 mg/dL — ABNORMAL HIGH (ref 70–99)
Potassium: 4.2 mmol/L (ref 3.5–5.1)
Sodium: 137 mmol/L (ref 135–145)

## 2020-05-29 LAB — CBC
HCT: 45.1 % (ref 39.0–52.0)
Hemoglobin: 15.3 g/dL (ref 13.0–17.0)
MCH: 31.2 pg (ref 26.0–34.0)
MCHC: 33.9 g/dL (ref 30.0–36.0)
MCV: 92 fL (ref 80.0–100.0)
Platelets: 171 10*3/uL (ref 150–400)
RBC: 4.9 MIL/uL (ref 4.22–5.81)
RDW: 11.9 % (ref 11.5–15.5)
WBC: 14.8 10*3/uL — ABNORMAL HIGH (ref 4.0–10.5)
nRBC: 1.1 % — ABNORMAL HIGH (ref 0.0–0.2)

## 2020-05-29 MED ORDER — PANTOPRAZOLE SODIUM 40 MG PO TBEC
40.0000 mg | DELAYED_RELEASE_TABLET | Freq: Two times a day (BID) | ORAL | Status: DC
  Administered 2020-05-29 – 2020-06-01 (×6): 40 mg via ORAL
  Filled 2020-05-29 (×6): qty 1

## 2020-05-29 NOTE — Progress Notes (Signed)
PROGRESS NOTE    Rickey Lane  HWY:[id number] DOB: 1966/01/03 DOA: 05/22/2020 PCP: Margaretann Loveless, PA-C    Brief Narrative:  54 y.o. male with medical history significant for paroxysmal atrial fibrillation status post cardioversion in the past.  History of recurrent kidney stones in the past that required lithotripsy for treatment. Pt normally follows [facility], last seen by Michiel Cowboy, PA-C in 07/22/19. Pt was admitted for intractable pain requiring IV analgesic  Assessment & Plan:   Active Problems:   Kidney stones   * Permanent A.fib -Rate control with oral metoprolol.  No need of anticoagulation. -Appreciate cardiology input  #1.  Acute left flank pain secondary to multiple left kidney stones  -Seen on CT scan -Status post Left ureteroscopy, laser lithotripsy, retrograde pyelogram with intraoperative interpretation, left ureteral stent placement on 12/3 by urology -Will need outpatient follow-up with urology for stent removal in 10 to 14 days -Continue Flomax and oxybutynin daily for stent pain -Reports significant burning and pain when he pees.  Not comfortable at all today and reports pain is 8 out of 10.  #2. Mild AKI:  -Suspected prerenal + postobstructive in etiology.  -resolved with IVF hydration  #3.  Leukocytosis:  -Urinalysis not suggestive of UTI -WBC normalized, likely reactive at presentation  #4 Uncontrolled hypertension - improved continue lisinopril 5 mg p.o. daily and metoprolol 50 mg po bid.   Poor social situation - reports no home, job, insurance, poor finances... -TOC had talk with him on 12/3.  See their note for details  Constipation On bowel regimen  DVT prophylaxis: Lovenox subq Code Status: Full Family Communication: Pt in room, no family  Status is: Inpatient  The patient will require care spanning > 2 midnights and should be moved to inpatient because: Ongoing active pain requiring inpatient pain  management  Dispo: The patient is from: Home              Anticipated d/c is to: Home              Anticipated d/c date is: 1 day              Patient currently is not medically stable to d/c.  Is still very uncomfortable and having symptoms of burning urination and pain when he urinates.    Consultants:   Urology  Cardio  Procedures:   ESWL on 12/3  Antimicrobials: Anti-infectives (From admission, onward)   Start     Dose/Rate Route Frequency Ordered Stop   05/27/20 0845  cephALEXin (KEFLEX) 500 MG capsule       Note to Pharmacy: [name]   : cabinet override      05/27/20 0845 05/27/20 0857      Subjective: Complains of burning and pain when he pees.  Still having a lot of pain.  Has not had bowel movement for him.  Feels very uncomfortable and could not sleep last night.  He does not feel he could go today  Objective: Vitals:   05/28/20 2002 05/29/20 0005 05/29/20 0323 05/29/20 1117  BP: 122/81 126/90 125/90 116/77  Pulse: 88 94 92 68  Resp: 20 18 18 16   Temp: 98 F (36.7 C) 98.3 F (36.8 C) 97.9 F (36.6 C) 98 F (36.7 C)  TempSrc: Oral Oral Oral Oral  SpO2: 98% 100% 99% 99%  Weight:      Height:       No intake or output data in the 24 hours ending 05/29/20 1430 Filed  Weights   05/23/20 0050 05/27/20 0900 05/28/20 0906  Weight: 97.1 kg 99.8 kg 97.5 kg    Examination: General exam: Conversant, in no acute distress Respiratory system: normal chest rise, clear, no audible wheezing Cardiovascular system: regular rhythm, s1-s2 Gastrointestinal system: Nondistended, nontender, pos BS Central nervous system: No seizures, no tremors Extremities: No cyanosis, no joint deformities Skin: No rashes, no pallor Psychiatry: Affect normal // no auditory hallucinations   Data Reviewed: I have personally reviewed following labs and imaging studies  CBC: Recent Labs  Lab 05/24/20 0520 05/27/20 0450 05/28/20 0529 05/29/20 0402  WBC 8.0 7.6 8.5 14.8*    HGB 15.3 16.1 16.0 15.3  HCT 45.4 46.3 47.1 45.1  MCV 91.7 90.1 91.1 92.0  PLT 183 192 198 171   Basic Metabolic Panel: Recent Labs  Lab 05/25/20 0647 05/26/20 0502 05/27/20 0450 05/28/20 0529 05/29/20 0402  NA 136 138 137 136 137  K 4.2 4.0 4.0 4.5 4.2  CL 103 103 103 106 104  CO2 24 24 25  19* 22  GLUCOSE 114* 94 87 87 106*  BUN 17 12 14 19  22*  CREATININE 0.87 0.74 0.73 0.83 0.84  CALCIUM 9.6 9.6 10.0 9.9 10.2   GFR: Estimated Creatinine Clearance: 119.1 mL/min (by C-G formula based on SCr of 0.84 mg/dL). Liver Function Tests: Recent Labs  Lab 05/24/20 0520 05/25/20 0647 05/26/20 0502  AST 22 20 41  ALT 27 26 52*  ALKPHOS 54 57 50  BILITOT 0.9 1.1 1.4*  PROT 7.1 6.9 6.9  ALBUMIN 4.0 3.8 3.9   No results for input(s): LIPASE, AMYLASE in the last 168 hours. CBG: Recent Labs  Lab 05/28/20 0740 05/28/20 1659  GLUCAP 84 130*     Recent Results (from the past 240 hour(s))  Resp Panel by RT-PCR (Flu A&B, Covid) Nasopharyngeal Swab     Status: None   Collection Time: 05/22/20  9:33 PM   Specimen: Nasopharyngeal Swab; Nasopharyngeal(NP) swabs in vial transport medium  Result Value Ref Range Status   SARS Coronavirus 2 by RT PCR NEGATIVE NEGATIVE Final    Comment: (NOTE) SARS-CoV-2 target nucleic acids are NOT DETECTED.  The SARS-CoV-2 RNA is generally detectable in upper respiratory specimens during the acute phase of infection. The lowest concentration of SARS-CoV-2 viral copies this assay can detect is 138 copies/mL. A negative result does not preclude SARS-Cov-2 infection and should not be used as the sole basis for treatment or other patient management decisions. A negative result may occur with  improper specimen collection/handling, submission of specimen other than nasopharyngeal swab, presence of viral mutation(s) within the areas targeted by this assay, and inadequate number of viral copies(<138 copies/mL). A negative result must be combined  with clinical observations, patient history, and epidemiological information. The expected result is Negative.  Fact Sheet for Patients:  14/03/21  Fact Sheet for Healthcare Providers:  05/24/20  This test is no t yet approved or cleared by the [employer] FDA and  has been authorized for detection and/or diagnosis of SARS-CoV-2 by FDA under an Emergency Use Authorization (EUA). This EUA will remain  in effect (meaning this test can be used) for the duration of the COVID-19 declaration under Section 564(b)(1) of the Act, 21 U.S.C.section 360bbb-3(b)(1), unless the authorization is terminated  or revoked sooner.       Influenza A by PCR NEGATIVE NEGATIVE Final   Influenza B by PCR NEGATIVE NEGATIVE Final    Comment: (NOTE) The Xpert Xpress SARS-CoV-2/FLU/RSV plus assay is intended  as an aid in the diagnosis of influenza from Nasopharyngeal swab specimens and should not be used as a sole basis for treatment. Nasal washings and aspirates are unacceptable for Xpert Xpress SARS-CoV-2/FLU/RSV testing.  Fact Sheet for Patients: BloggerCourse.com  Fact Sheet for Healthcare Providers: SeriousBroker.it  This test is not yet approved or cleared by the [place] FDA and has been authorized for detection and/or diagnosis of SARS-CoV-2 by FDA under an Emergency Use Authorization (EUA). This EUA will remain in effect (meaning this test can be used) for the duration of the COVID-19 declaration under Section 564(b)(1) of the Act, 21 U.S.C. section 360bbb-3(b)(1), unless the authorization is terminated or revoked.  Performed at [facility], [address] [place], [place] [address]      Radiology Studies: DG OR UROLOGY CYSTO IMAGE [facility] ONLY)  Result Date: 05/28/2020 There is no interpretation for this exam.  This order is for images obtained during a  surgical procedure.  Please See "Surgeries" Tab for more information regarding the procedure.    Scheduled Meds: . metoprolol tartrate  50 mg Oral BID  . oxybutynin  10 mg Oral QHS  . pantoprazole  40 mg Oral BID AC  . senna-docusate  2 tablet Oral BID  . tamsulosin  0.4 mg Oral Daily   Continuous Infusions:    LOS: 6 days   Delfino Lovett, MD [facility] Pager On Amion  If 7PM-7AM, please contact night-coverage 05/29/2020, 2:30 PM

## 2020-05-29 NOTE — Plan of Care (Signed)

## 2020-05-29 NOTE — Progress Notes (Signed)
PHARMACIST - PHYSICIAN COMMUNICATION  DR:   Sherryll Burger  CONCERNING: IV to Oral Route Change Policy  RECOMMENDATION: This patient is receiving pantoprazole by the intravenous route.  Based on criteria approved by the Pharmacy and Therapeutics Committee, the intravenous medication(s) is/are being converted to the equivalent oral dose form(s).   DESCRIPTION: These criteria include:  The patient is eating (either orally or via tube) and/or has been taking other orally administered medications for a least 24 hours  The patient has no evidence of active gastrointestinal bleeding or impaired GI absorption (gastrectomy, short bowel, patient on TNA or NPO).  If you have questions about this conversion, please contact the Pharmacy Department  []   [phone] )  ( [phone] [x]   [phone] )  [facility] []   [phone] )  [facility] []   [phone] )  [facility] []   [phone] )  [facility]   ( [phone], PharmD, BCPS Clinical Pharmacist 05/29/2020 10:59 AM

## 2020-05-30 DIAGNOSIS — R300 Dysuria: Secondary | ICD-10-CM

## 2020-05-30 DIAGNOSIS — D72828 Other elevated white blood cell count: Secondary | ICD-10-CM

## 2020-05-30 LAB — CBC
HCT: 43.1 % (ref 39.0–52.0)
Hemoglobin: 14.8 g/dL (ref 13.0–17.0)
MCH: 30.8 pg (ref 26.0–34.0)
MCHC: 34.3 g/dL (ref 30.0–36.0)
MCV: 89.8 fL (ref 80.0–100.0)
Platelets: 206 10*3/uL (ref 150–400)
RBC: 4.8 MIL/uL (ref 4.22–5.81)
RDW: 12 % (ref 11.5–15.5)
WBC: 11 10*3/uL — ABNORMAL HIGH (ref 4.0–10.5)
nRBC: 0 % (ref 0.0–0.2)

## 2020-05-30 LAB — BASIC METABOLIC PANEL
Anion gap: 8 (ref 5–15)
BUN: 22 mg/dL — ABNORMAL HIGH (ref 6–20)
CO2: 23 mmol/L (ref 22–32)
Calcium: 10.1 mg/dL (ref 8.9–10.3)
Chloride: 105 mmol/L (ref 98–111)
Creatinine, Ser: 0.84 mg/dL (ref 0.61–1.24)
GFR, Estimated: >60 mL/min (ref >60)
Glucose, Bld: 95 mg/dL (ref 70–99)
Potassium: 4.2 mmol/L (ref 3.5–5.1)
Sodium: 136 mmol/L (ref 135–145)

## 2020-05-30 MED ORDER — BISACODYL 5 MG PO TBEC: 10.0000 mg | DELAYED_RELEASE_TABLET | Freq: Every day | ORAL | Status: DC | PRN

## 2020-05-30 MED ORDER — MORPHINE SULFATE (PF) 2 MG/ML IV SOLN
1.0000 mg | INTRAVENOUS | Status: DC | PRN
  Administered 2020-05-30 – 2020-05-31 (×3): 1 mg via INTRAVENOUS
  Filled 2020-05-30 (×4): qty 1

## 2020-05-30 NOTE — Progress Notes (Signed)
PROGRESS NOTE    Rickey Lane  NFA:[id number] DOB: 11-01-1965 DOA: 05/22/2020 PCP: Margaretann Loveless, PA-C  Assessment & Plan:   Active Problems:   Kidney stones    Acute left flank pain: secondary to multiple left kidney stones. S/p left ureteroscopy, laser lithotripsy, retrograde pyelogram, & left ureteral stent placement on 05/28/20 by urology. Will need outpatient follow-up with urology for stent removal in 10 to 14 days. Continue on flomax. Still w/ dysuria, urine cx ordered.   Permanent a. fib: continue on home dose of metoprolol    AKI: likely secondary to kidney stone. Resolved  Leukocytosis: UA is neg. Trending down. Will continue to monitor   HTN: uncontrolled. Continue on lisinopril, metoprolol   Poor social situation: reports no home, job, insurance, poor finances. TOC following   Constipation: continue on senokot, dulcolax    DVT prophylaxis: SCDs Code Status: full  Family Communication:  Disposition Plan: likely d/c back home/homeless sheleter   Status is: Inpatient  Remains inpatient appropriate because:Ongoing diagnostic testing needed not appropriate for outpatient work up, IV treatments appropriate due to intensity of illness or inability to take PO and Inpatient level of care appropriate due to severity of illness   Dispo: The patient is from: Home              Anticipated d/c is to: Home              Anticipated d/c date is: 2 days              Patient currently is not medically stable to d/c.        Consultants:   Urology    Procedures:    Antimicrobials:   Subjective: Pt c/o burning w/ urination   Objective: Vitals:   05/29/20 2037 05/30/20 0044 05/30/20 0453 05/30/20 0726  BP: 125/80 (!) 124/105 112/81 118/88  Pulse: 82 87 88 (!) 55  Resp: 18 18 17 16   Temp: 98 F (36.7 C) 97.6 F (36.4 C) 97.9 F (36.6 C) 98.5 F (36.9 C)  TempSrc:    Oral  SpO2: 97% 98% 95% 98%  Weight:      Height:       No intake  or output data in the 24 hours ending 05/30/20 0912 Filed Weights   05/23/20 0050 05/27/20 0900 05/28/20 0906  Weight: 97.1 kg 99.8 kg 97.5 kg    Examination:  General exam: Appears calm but uncomfortable  Respiratory system: Clear to auscultation. Respiratory effort normal. Cardiovascular system: S1 & S2 +. No rubs, gallops or clicks.  Gastrointestinal system: Abdomen is obese, soft and nontender. Hypoactive bowel sounds heard. Central nervous system: Alert and oriented. Moves all 4 extremities Psychiatry: Judgement and insight appear normal. Flat mood and affect.     Data Reviewed: I have personally reviewed following labs and imaging studies  CBC: Recent Labs  Lab 05/24/20 0520 05/27/20 0450 05/28/20 0529 05/29/20 0402 05/30/20 0333  WBC 8.0 7.6 8.5 14.8* 11.0*  HGB 15.3 16.1 16.0 15.3 14.8  HCT 45.4 46.3 47.1 45.1 43.1  MCV 91.7 90.1 91.1 92.0 89.8  PLT 183 192 198 171 206   Basic Metabolic Panel: Recent Labs  Lab 05/26/20 0502 05/27/20 0450 05/28/20 0529 05/29/20 0402 05/30/20 0333  NA 138 137 136 137 136  K 4.0 4.0 4.5 4.2 4.2  CL 103 103 106 104 105  CO2 24 25 19* 22 23  GLUCOSE 94 87 87 106* 95  BUN 12 14 19  22* 22*  CREATININE 0.74 0.73 0.83 0.84 0.84  CALCIUM 9.6 10.0 9.9 10.2 10.1   GFR: Estimated Creatinine Clearance: 119.1 mL/min (by C-G formula based on SCr of 0.84 mg/dL). Liver Function Tests: Recent Labs  Lab 05/24/20 0520 05/25/20 0647 05/26/20 0502  AST 22 20 41  ALT 27 26 52*  ALKPHOS 54 57 50  BILITOT 0.9 1.1 1.4*  PROT 7.1 6.9 6.9  ALBUMIN 4.0 3.8 3.9   No results for input(s): LIPASE, AMYLASE in the last 168 hours. No results for input(s): AMMONIA in the last 168 hours. Coagulation Profile: No results for input(s): INR, PROTIME in the last 168 hours. Cardiac Enzymes: No results for input(s): CKTOTAL, CKMB, CKMBINDEX, TROPONINI in the last 168 hours. BNP (last 3 results) No results for input(s): PROBNP in the last 8760  hours. HbA1C: No results for input(s): HGBA1C in the last 72 hours. CBG: Recent Labs  Lab 05/28/20 0740 05/28/20 1659  GLUCAP 84 130*   Lipid Profile: No results for input(s): CHOL, HDL, LDLCALC, TRIG, CHOLHDL, LDLDIRECT in the last 72 hours. Thyroid Function Tests: No results for input(s): TSH, T4TOTAL, FREET4, T3FREE, THYROIDAB in the last 72 hours. Anemia Panel: No results for input(s): VITAMINB12, FOLATE, FERRITIN, TIBC, IRON, RETICCTPCT in the last 72 hours. Sepsis Labs: No results for input(s): PROCALCITON, LATICACIDVEN in the last 168 hours.  Recent Results (from the past 240 hour(s))  Resp Panel by RT-PCR (Flu A&B, Covid) Nasopharyngeal Swab     Status: None   Collection Time: 05/22/20  9:33 PM   Specimen: Nasopharyngeal Swab; Nasopharyngeal(NP) swabs in vial transport medium  Result Value Ref Range Status   SARS Coronavirus 2 by RT PCR NEGATIVE NEGATIVE Final    Comment: (NOTE) SARS-CoV-2 target nucleic acids are NOT DETECTED.  The SARS-CoV-2 RNA is generally detectable in upper respiratory specimens during the acute phase of infection. The lowest concentration of SARS-CoV-2 viral copies this assay can detect is 138 copies/mL. A negative result does not preclude SARS-Cov-2 infection and should not be used as the sole basis for treatment or other patient management decisions. A negative result may occur with  improper specimen collection/handling, submission of specimen other than nasopharyngeal swab, presence of viral mutation(s) within the areas targeted by this assay, and inadequate number of viral copies(<138 copies/mL). A negative result must be combined with clinical observations, patient history, and epidemiological information. The expected result is Negative.  Fact Sheet for Patients:  BloggerCourse.com  Fact Sheet for Healthcare Providers:  SeriousBroker.it  This test is no t yet approved or cleared by  the [place] FDA and  has been authorized for detection and/or diagnosis of SARS-CoV-2 by FDA under an Emergency Use Authorization (EUA). This EUA will remain  in effect (meaning this test can be used) for the duration of the COVID-19 declaration under Section 564(b)(1) of the Act, 21 U.S.C.section 360bbb-3(b)(1), unless the authorization is terminated  or revoked sooner.       Influenza A by PCR NEGATIVE NEGATIVE Final   Influenza B by PCR NEGATIVE NEGATIVE Final    Comment: (NOTE) The Xpert Xpress SARS-CoV-2/FLU/RSV plus assay is intended as an aid in the diagnosis of influenza from Nasopharyngeal swab specimens and should not be used as a sole basis for treatment. Nasal washings and aspirates are unacceptable for Xpert Xpress SARS-CoV-2/FLU/RSV testing.  Fact Sheet for Patients: BloggerCourse.com  Fact Sheet for Healthcare Providers: SeriousBroker.it  This test is not yet approved or cleared by the [place] FDA and has been authorized for detection  and/or diagnosis of SARS-CoV-2 by FDA under an Emergency Use Authorization (EUA). This EUA will remain in effect (meaning this test can be used) for the duration of the COVID-19 declaration under Section 564(b)(1) of the Act, 21 U.S.C. section 360bbb-3(b)(1), unless the authorization is terminated or revoked.  Performed at [facility], [address] [place], [place] [address]          Radiology Studies: DG OR UROLOGY CYSTO IMAGE [facility] ONLY)  Result Date: 05/28/2020 There is no interpretation for this exam.  This order is for images obtained during a surgical procedure.  Please See "Surgeries" Tab for more information regarding the procedure.        Scheduled Meds: . metoprolol tartrate  50 mg Oral BID  . oxybutynin  10 mg Oral QHS  . pantoprazole  40 mg Oral BID AC  . senna-docusate  2 tablet Oral BID  . tamsulosin  0.4 mg Oral Daily    Continuous Infusions:   LOS: 7 days    Time spent: 32 mins     Charise Killian, MD [facility] Pager [phone]  If 7PM-7AM, please contact night-coverage 05/30/2020, 9:12 AM

## 2020-05-31 DIAGNOSIS — K219 Gastro-esophageal reflux disease without esophagitis: Secondary | ICD-10-CM

## 2020-05-31 LAB — CBC
HCT: 45.3 % (ref 39.0–52.0)
Hemoglobin: 15.6 g/dL (ref 13.0–17.0)
MCH: 31 pg (ref 26.0–34.0)
MCHC: 34.4 g/dL (ref 30.0–36.0)
MCV: 89.9 fL (ref 80.0–100.0)
Platelets: 223 10*3/uL (ref 150–400)
RBC: 5.04 MIL/uL (ref 4.22–5.81)
RDW: 11.9 % (ref 11.5–15.5)
WBC: 8.9 10*3/uL (ref 4.0–10.5)
nRBC: 0 % (ref 0.0–0.2)

## 2020-05-31 LAB — BASIC METABOLIC PANEL
Anion gap: 9 (ref 5–15)
BUN: 23 mg/dL — ABNORMAL HIGH (ref 6–20)
CO2: 24 mmol/L (ref 22–32)
Calcium: 10.3 mg/dL (ref 8.9–10.3)
Chloride: 103 mmol/L (ref 98–111)
Creatinine, Ser: 0.84 mg/dL (ref 0.61–1.24)
GFR, Estimated: >60 mL/min (ref >60)
Glucose, Bld: 104 mg/dL — ABNORMAL HIGH (ref 70–99)
Potassium: 3.9 mmol/L (ref 3.5–5.1)
Sodium: 136 mmol/L (ref 135–145)

## 2020-05-31 LAB — URINE CULTURE: Culture: NO GROWTH

## 2020-05-31 MED ORDER — OXYCODONE-ACETAMINOPHEN 7.5-325 MG PO TABS
2.0000 | ORAL_TABLET | Freq: Four times a day (QID) | ORAL | Status: DC | PRN
  Administered 2020-05-31 – 2020-06-01 (×3): 2 via ORAL
  Filled 2020-05-31 (×3): qty 2

## 2020-05-31 MED ORDER — MORPHINE SULFATE (PF) 2 MG/ML IV SOLN
2.0000 mg | INTRAVENOUS | Status: DC | PRN
  Filled 2020-05-31: qty 1

## 2020-05-31 NOTE — Progress Notes (Addendum)
Urology Inpatient Progress Note  Subjective: No acute events overnight. Creatinine stable, 0.84.  WBC count down today, 8.9. Patient reports constant left flank pain worsened with urination.  He denies gross hematuria or dysuria.  Anti-infectives: Anti-infectives (From admission, onward)   Start     Dose/Rate Route Frequency Ordered Stop   05/27/20 0845  cephALEXin (KEFLEX) 500 MG capsule       Note to Pharmacy: [name]   : cabinet override      05/27/20 0845 05/27/20 0857      Current Facility-Administered Medications  Medication Dose Route Frequency Provider Last Rate Last Admin  . acetaminophen (TYLENOL) tablet 500 mg  500 mg Oral Q6H PRN Sondra Come, MD   500 mg at 05/29/20 0417  . bisacodyl (DULCOLAX) EC tablet 10 mg  10 mg Oral Daily PRN Charise Killian, MD      . hydrOXYzine (ATARAX/VISTARIL) tablet 10 mg  10 mg Oral Q4H PRN Sondra Come, MD   10 mg at 05/25/20 1157  . metoprolol tartrate (LOPRESSOR) tablet 50 mg  50 mg Oral BID Sondra Come, MD   50 mg at 05/31/20 0959  . morphine 2 MG/ML injection 2 mg  2 mg Intravenous Q4H PRN Charise Killian, MD      . ondansetron [facility] tablet 4 mg  4 mg Oral Q6H PRN Sondra Come, MD       Or  . ondansetron [facility]) injection 4 mg  4 mg Intravenous Q6H PRN Sondra Come, MD   4 mg at 05/24/20 2039  . oxybutynin (DITROPAN-XL) 24 hr tablet 10 mg  10 mg Oral QHS Sondra Come, MD   10 mg at 05/30/20 2106  . oxyCODONE-acetaminophen (PERCOCET) 7.5-325 MG per tablet 2 tablet  2 tablet Oral Q6H PRN Charise Killian, MD   2 tablet at 05/31/20 0956  . pantoprazole (PROTONIX) EC tablet 40 mg  40 mg Oral BID AC Albina Billet, RPH   40 mg at 05/31/20 0830  . senna-docusate (Senokot-S) tablet 2 tablet  2 tablet Oral BID Delfino Lovett, MD   2 tablet at 05/31/20 0959  . tamsulosin (FLOMAX) capsule 0.4 mg  0.4 mg Oral Daily Legrand Rams C, MD   0.4 mg at 05/31/20 0959   Objective: Vital signs in last 24  hours: Temp:  [97.8 F (36.6 C)-98.5 F (36.9 C)] 97.8 F (36.6 C) (12/06 1125) Pulse Rate:  [58-90] 83 (12/06 1125) Resp:  [18-20] 19 (12/06 1125) BP: (106-133)/(75-93) 133/93 (12/06 1125) SpO2:  [96 %-98 %] 97 % (12/06 1125)  Intake/Output from previous day: 12/05 0701 - 12/06 0700 In: -  Out: 170 [Urine:170] Intake/Output this shift: Total I/O In: 240 [P.O.:240] Out: -   Physical Exam Vitals and nursing note reviewed.  Constitutional:      General: He is not in acute distress.    Appearance: He is not ill-appearing, toxic-appearing or diaphoretic.  HENT:     Head: Normocephalic and atraumatic.  Pulmonary:     Effort: Pulmonary effort is normal. No respiratory distress.  Skin:    General: Skin is warm and dry.  Neurological:     Mental Status: He is alert and oriented to person, place, and time.  Psychiatric:        Mood and Affect: Mood normal.        Behavior: Behavior normal.    Lab Results:  Recent Labs    05/30/20 0333 05/31/20 0447  WBC 11.0* 8.9  HGB 14.8 15.6  HCT 43.1 45.3  PLT 206 223   BMET Recent Labs    05/30/20 0333 05/31/20 0447  NA 136 136  K 4.2 3.9  CL 105 103  CO2 23 24  GLUCOSE 95 104*  BUN 22* 23*  CREATININE 0.84 0.84  CALCIUM 10.1 10.3   Assessment & Plan: 54 year old male now POD 3 from left ureteroscopy with laser lithotripsy and stent placement with Dr. Richardo Hanks for management of a left proximal ureteral stone and multiple small left renal stones.  He has been having difficult to manage stent discomfort postoperatively despite scheduled oxybutynin and Flomax.  Recommend adding B&O suppositories, Pyridium, and Toradol to his pain regimen, though Pyridium may offer minimal symptomatic improvement in the absence of dysuria.  Please continue oxybutynin and Flomax.  Per Dr. Richardo Hanks, patient's left ureter was found to be a bit narrow intraoperatively.  He is recommended to keep his left ureteral stent for at least 7 days with  concerns for new urinary obstruction and/or renal colic with premature removal.  I discussed this with the patient at the bedside today and he expressed understanding. We will move up scheduled cystoscopy stent removal in our clinic to this Friday, 06/04/2020.  Carman Ching, PA-C 05/31/2020

## 2020-05-31 NOTE — Telephone Encounter (Signed)
Rickey Lane can you help with this please? Does he FMLA or does he just need a regular note from me? Thanks

## 2020-05-31 NOTE — Progress Notes (Signed)
PROGRESS NOTE    Rickey Lane  EXH:[id number] DOB: Mar 02, 1966 DOA: 05/22/2020 PCP: Margaretann Loveless, PA-C  Assessment & Plan:   Active Problems:   Kidney stones    Acute left flank pain: secondary to multiple left kidney stones. S/p left ureteroscopy, laser lithotripsy, retrograde pyelogram, & left ureteral stent placement on 05/28/20 by urology. Will need outpatient follow-up with urology for stent removal in 10 to 14 days. Continue on flomax. Still w/ dysuria, urine cx is pending. D/c tramadol, started percocet & increased morphine for continued pain   Permanent a. fib: continue on home dose of metoprolol   AKI: likely secondary to kidney stone. Resolved  Leukocytosis:  Resolved   HTN: uncontrolled. Continue on lisinopril, metoprolol   Poor social situation: reports no home, job, insurance, poor finances. TOC following   Constipation: continue on senokot, dulcolax    DVT prophylaxis: SCDs Code Status: full  Family Communication:  Disposition Plan: likely d/c back home/homeless sheleter   Status is: Inpatient  Remains inpatient appropriate because:Ongoing diagnostic testing needed not appropriate for outpatient work up, IV treatments appropriate due to intensity of illness or inability to take PO and Inpatient level of care appropriate due to severity of illness   Dispo: The patient is from: Home              Anticipated d/c is to: Home              Anticipated d/c date is: 1 day               Patient currently is not medically stable to d/c.        Consultants:   Urology    Procedures:    Antimicrobials:   Subjective: Pt c/o burning w/ urination & flank pain still   Objective: Vitals:   05/30/20 1526 05/30/20 1926 05/31/20 0548 05/31/20 0752  BP: 110/82 106/75 (!) 117/92 (!) 116/91  Pulse: 90 88 78 (!) 58  Resp: 20 18 18 18   Temp: 98.4 F (36.9 C) 98.5 F (36.9 C) 98 F (36.7 C) 98 F (36.7 C)  TempSrc: Oral Oral Oral Oral    SpO2: 96% 98% 97% 97%  Weight:      Height:        Intake/Output Summary (Last 24 hours) at 05/31/2020 0804 Last data filed at 05/30/2020 1456 Gross per 24 hour  Intake --  Output 170 ml  Net -170 ml   Filed Weights   05/23/20 0050 05/27/20 0900 05/28/20 0906  Weight: 97.1 kg 99.8 kg 97.5 kg    Examination:  General exam: Appears calm but uncomfortable  Respiratory system: Clear to auscultation. Respiratory effort normal. Cardiovascular system: S1/S2+. No rubs or clicks  Gastrointestinal system: Abd is soft, obese, tenderness to palpation & normal bowel sounds Central nervous system: Alert and oriented. Moves all 4 extremities  Psychiatry: Judgement and insight appear normal. Flat mood and affect.     Data Reviewed: I have personally reviewed following labs and imaging studies  CBC: Recent Labs  Lab 05/27/20 0450 05/28/20 0529 05/29/20 0402 05/30/20 0333 05/31/20 0447  WBC 7.6 8.5 14.8* 11.0* 8.9  HGB 16.1 16.0 15.3 14.8 15.6  HCT 46.3 47.1 45.1 43.1 45.3  MCV 90.1 91.1 92.0 89.8 89.9  PLT 192 198 171 206 223   Basic Metabolic Panel: Recent Labs  Lab 05/27/20 0450 05/28/20 0529 05/29/20 0402 05/30/20 0333 05/31/20 0447  NA 137 136 137 136 136  K 4.0 4.5 4.2 4.2 3.9  CL 103 106 104 105 103  CO2 25 19* 22 23 24   GLUCOSE 87 87 106* 95 104*  BUN 14 19 22* 22* 23*  CREATININE 0.73 0.83 0.84 0.84 0.84  CALCIUM 10.0 9.9 10.2 10.1 10.3   GFR: Estimated Creatinine Clearance: 119.1 mL/min (by C-G formula based on SCr of 0.84 mg/dL). Liver Function Tests: Recent Labs  Lab 05/25/20 0647 05/26/20 0502  AST 20 41  ALT 26 52*  ALKPHOS 57 50  BILITOT 1.1 1.4*  PROT 6.9 6.9  ALBUMIN 3.8 3.9   No results for input(s): LIPASE, AMYLASE in the last 168 hours. No results for input(s): AMMONIA in the last 168 hours. Coagulation Profile: No results for input(s): INR, PROTIME in the last 168 hours. Cardiac Enzymes: No results for input(s): CKTOTAL, CKMB,  CKMBINDEX, TROPONINI in the last 168 hours. BNP (last 3 results) No results for input(s): PROBNP in the last 8760 hours. HbA1C: No results for input(s): HGBA1C in the last 72 hours. CBG: Recent Labs  Lab 05/28/20 0740 05/28/20 1659  GLUCAP 84 130*   Lipid Profile: No results for input(s): CHOL, HDL, LDLCALC, TRIG, CHOLHDL, LDLDIRECT in the last 72 hours. Thyroid Function Tests: No results for input(s): TSH, T4TOTAL, FREET4, T3FREE, THYROIDAB in the last 72 hours. Anemia Panel: No results for input(s): VITAMINB12, FOLATE, FERRITIN, TIBC, IRON, RETICCTPCT in the last 72 hours. Sepsis Labs: No results for input(s): PROCALCITON, LATICACIDVEN in the last 168 hours.  Recent Results (from the past 240 hour(s))  Resp Panel by RT-PCR (Flu A&B, Covid) Nasopharyngeal Swab     Status: None   Collection Time: 05/22/20  9:33 PM   Specimen: Nasopharyngeal Swab; Nasopharyngeal(NP) swabs in vial transport medium  Result Value Ref Range Status   SARS Coronavirus 2 by RT PCR NEGATIVE NEGATIVE Final    Comment: (NOTE) SARS-CoV-2 target nucleic acids are NOT DETECTED.  The SARS-CoV-2 RNA is generally detectable in upper respiratory specimens during the acute phase of infection. The lowest concentration of SARS-CoV-2 viral copies this assay can detect is 138 copies/mL. A negative result does not preclude SARS-Cov-2 infection and should not be used as the sole basis for treatment or other patient management decisions. A negative result may occur with  improper specimen collection/handling, submission of specimen other than nasopharyngeal swab, presence of viral mutation(s) within the areas targeted by this assay, and inadequate number of viral copies(<138 copies/mL). A negative result must be combined with clinical observations, patient history, and epidemiological information. The expected result is Negative.  Fact Sheet for Patients:  05/24/20  Fact Sheet for  Healthcare Providers:  BloggerCourse.com  This test is no t yet approved or cleared by the [place] FDA and  has been authorized for detection and/or diagnosis of SARS-CoV-2 by FDA under an Emergency Use Authorization (EUA). This EUA will remain  in effect (meaning this test can be used) for the duration of the COVID-19 declaration under Section 564(b)(1) of the Act, 21 U.S.C.section 360bbb-3(b)(1), unless the authorization is terminated  or revoked sooner.       Influenza A by PCR NEGATIVE NEGATIVE Final   Influenza B by PCR NEGATIVE NEGATIVE Final    Comment: (NOTE) The Xpert Xpress SARS-CoV-2/FLU/RSV plus assay is intended as an aid in the diagnosis of influenza from Nasopharyngeal swab specimens and should not be used as a sole basis for treatment. Nasal washings and aspirates are unacceptable for Xpert Xpress SARS-CoV-2/FLU/RSV testing.  Fact Sheet for Patients: [place]  Fact Sheet for Healthcare Providers: BloggerCourse.com  This test is not yet approved or cleared by the [place] and has been authorized for detection and/or diagnosis of SARS-CoV-2 by FDA under an Emergency Use Authorization (EUA). This EUA will remain in effect (meaning this test can be used) for the duration of the COVID-19 declaration under Section 564(b)(1) of the Act, 21 U.S.C. section 360bbb-3(b)(1), unless the authorization is terminated or revoked.  Performed at [facility], [address] [place], [place] [address]          Radiology Studies: No results found.      Scheduled Meds: . metoprolol tartrate  50 mg Oral BID  . oxybutynin  10 mg Oral QHS  . pantoprazole  40 mg Oral BID AC  . senna-docusate  2 tablet Oral BID  . tamsulosin  0.4 mg Oral Daily   Continuous Infusions:   LOS: 8 days    Time spent: 33 mins     Charise Killian, MD [facility] Pager [phone]  If 7PM-7AM, please contact night-coverage 05/31/2020, 8:04 AM

## 2020-06-01 ENCOUNTER — Other Ambulatory Visit: Payer: Self-pay | Admitting: Internal Medicine

## 2020-06-01 ENCOUNTER — Telehealth: Payer: Self-pay | Admitting: Gerontology

## 2020-06-01 DIAGNOSIS — I482 Chronic atrial fibrillation, unspecified: Secondary | ICD-10-CM

## 2020-06-01 LAB — CBC
HCT: 44.6 % (ref 39.0–52.0)
Hemoglobin: 15.7 g/dL (ref 13.0–17.0)
MCH: 31.2 pg (ref 26.0–34.0)
MCHC: 35.2 g/dL (ref 30.0–36.0)
MCV: 88.7 fL (ref 80.0–100.0)
Platelets: 203 10*3/uL (ref 150–400)
RBC: 5.03 MIL/uL (ref 4.22–5.81)
RDW: 11.9 % (ref 11.5–15.5)
WBC: 9 10*3/uL (ref 4.0–10.5)
nRBC: 0 % (ref 0.0–0.2)

## 2020-06-01 LAB — BASIC METABOLIC PANEL
Anion gap: 10 (ref 5–15)
BUN: 25 mg/dL — ABNORMAL HIGH (ref 6–20)
CO2: 23 mmol/L (ref 22–32)
Calcium: 10 mg/dL (ref 8.9–10.3)
Chloride: 104 mmol/L (ref 98–111)
Creatinine, Ser: 0.79 mg/dL (ref 0.61–1.24)
GFR, Estimated: >60 mL/min (ref >60)
Glucose, Bld: 92 mg/dL (ref 70–99)
Potassium: 3.9 mmol/L (ref 3.5–5.1)
Sodium: 137 mmol/L (ref 135–145)

## 2020-06-01 MED ORDER — OXYCODONE-ACETAMINOPHEN 7.5-325 MG PO TABS: 1.0000 | ORAL_TABLET | Freq: Four times a day (QID) | ORAL | 0 refills | Status: AC | PRN

## 2020-06-01 MED ORDER — METOPROLOL TARTRATE 50 MG PO TABS: 50.0000 mg | ORAL_TABLET | Freq: Two times a day (BID) | ORAL | 0 refills | Status: DC

## 2020-06-01 MED ORDER — OXYBUTYNIN CHLORIDE ER 10 MG PO TB24: 10.0000 mg | ORAL_TABLET | Freq: Every day | ORAL | 0 refills | Status: DC

## 2020-06-01 MED ORDER — TAMSULOSIN HCL 0.4 MG PO CAPS: 0.4000 mg | ORAL_CAPSULE | Freq: Every day | ORAL | 0 refills | Status: DC

## 2020-06-01 NOTE — Discharge Summary (Signed)
Physician Discharge Summary  Rickey Lane:[id number] DOB: 1965-07-27 DOA: 05/22/2020  PCP: Margaretann Loveless, PA-C  Admit date: 05/22/2020 Discharge date: 06/01/2020  Admitted From: home  Disposition:  Home   Recommendations for Outpatient Follow-up:  1. Follow up with PCP in 1-2 weeks 2. F/u w/ urology, Rickey Lane, on 06/04/20  Home Health: no  Equipment/Devices:  Discharge Condition: stable  CODE STATUS: full  Diet recommendation: Heart Healthy  Brief/Interim Summary: HPI was taken from Dr. Margette Fast: Rickey Lane is an 54 y.o. male with medical history significant for paroxysmal atrial fibrillation status post cardioversion in the past.  History of recurrent kidney stones in the past that required lithotripsy for treatment.  He presents to the emergency room for the second time in 2 days on account of left lower quadrant and groin pain.  Symptoms were consistent with his previous kidney stones flare.  Symptoms have been associated with nausea and vomiting.  He was seen in the ED yesterday and was discharged.  He returns to the ED on account of severe 10/10 left quadrant abdominal pain.  Work-up including CT did reveal multiple left sided kidney stones with largest stone measuring 9 mm.  There was some evidence of surrounding left-sided hydronephrosis.  Hospital Course from Dr. Mayford Knife 12/5-12/7/21: Pt presented w/ acute left flank pain secondary to multiple left kidney stones. Pt had left ureteroscopy, laser lithotripsy, retrograde pyelogram & left ureteral stent placement on 05/28/20 by urology. Urine cx showed no growth. Pt will f/u w/ urology on 06/04/20 for stent removal. Pt verbalized his understanding. All of pt's medications were sent to med management except for controlled substances as the pt does not have insurance. Please see previous progress/consult notes for more information.   Discharge Diagnoses:  Active Problems:   Kidney stones  Acute left flank  pain: secondary to multiple left kidney stones. S/p left ureteroscopy, laser lithotripsy, retrograde pyelogram, & left ureteral stent placement on 05/28/20 by urology. Will need outpatient follow-up with urology for stent removal in 10 to 14 days. Continue on flomax. Urine cx shows no growth. Continue percocet & morphine, pain much improved  Permanent a. fib: continue on metoprolol   AKI: resolved  Leukocytosis:  resolved  HTN: uncontrolled. Continue on lisinopril, metoprolol   Poor social situation: reports no home, job, insurance, poor finances. TOC following   Constipation: resolved    Discharge Instructions  Discharge Instructions    Diet - low sodium heart healthy   Complete by: As directed    Discharge instructions   Complete by: As directed    F/u w/ PCP in 1-2 weeks. F/u w/ urology, Dr. Richardo Lane, on 06/04/20   Increase activity slowly   Complete by: As directed    No wound care   Complete by: As directed      Allergies as of 06/01/2020      Reactions   Nitroglycerin Other (See Comments)   "Shivering" and "decreased heart rate"      Medication List    TAKE these medications   acetaminophen 500 MG tablet Commonly known as: TYLENOL Take 500-1,000 mg by mouth every 6 (six) hours as needed for mild pain or moderate pain.   famotidine 20 MG tablet Commonly known as: PEPCID Take 20 mg by mouth 2 (two) times daily as needed for heartburn or indigestion.   Garlic 500 MG Caps Take 1 capsule by mouth as directed. Notes to patient: None given in hospital   Ginger 500 MG Caps Take 1 capsule  by mouth daily. Notes to patient: None given in hospital   metoprolol tartrate 50 MG tablet Commonly known as: LOPRESSOR Take 1 tablet (50 mg total) by mouth 2 (two) times daily.   Multi-Vitamins Tabs Take 1 tablet by mouth daily. Notes to patient: None given in hospital   Osteo Bi-Flex Regular Strength 250-200 MG Tabs Generic drug: Glucosamine-Chondroitin Take 1 tablet by  mouth as directed. Notes to patient: None given in hospital   oxybutynin 10 MG 24 hr tablet Commonly known as: DITROPAN-XL Take 1 tablet (10 mg total) by mouth at bedtime.   oxyCODONE-acetaminophen 7.5-325 MG tablet Commonly known as: PERCOCET Take 1 tablet by mouth every 6 (six) hours as needed for up to 5 days for moderate pain or severe pain.   sildenafil 100 MG tablet Commonly known as: VIAGRA Take 1 tablet (100 mg total) by mouth daily as needed for erectile dysfunction. Take two hours prior to intercourse on an empty stomach Notes to patient: None given in hospital   tamsulosin 0.4 MG Caps capsule Commonly known as: FLOMAX Take 1 capsule (0.4 mg total) by mouth daily. Discontinue after symptoms improve   zinc sulfate 220 (50 Zn) MG capsule Take 220 mg by mouth daily. Notes to patient: None given in hospital       Allergies  Allergen Reactions  . Nitroglycerin Other (See Comments)    "Shivering" and "decreased heart rate"    Consultations:  Urology    Procedures/Studies: DG Chest 2 View  Result Date: 05/07/2020 CLINICAL DATA:  Shortness of breath EXAM: CHEST - 2 VIEW COMPARISON:  10/26/2015 FINDINGS: The heart size and mediastinal contours are within normal limits. Both lungs are clear. The visualized skeletal structures are unremarkable. IMPRESSION: No active cardiopulmonary disease. Electronically Signed   By: Jasmine Pang M.D.   On: 05/07/2020 22:08   DG Abd 1 View  Result Date: 05/27/2020 CLINICAL DATA:  Left-sided ureteral calculus EXAM: ABDOMEN - 1 VIEW COMPARISON:  May 24, 2020 FINDINGS: Calcification again noted to the left of L3-4 measuring 0.9 x 0.9 cm. Tiny presumed phleboliths noted in the pelvis, stable. Moderate stool noted in colon. No bowel dilatation or air-fluid level to suggest bowel obstruction. No free air. Lung bases clear. IMPRESSION: Persistent 0.9 x 0.9 cm apparent ureteral calculus at the L3-4 level. No new calcifications. No bowel  obstruction or free air. Electronically Signed   By: Bretta Bang III M.D.   On: 05/27/2020 08:56   DG Abd 1 View  Result Date: 05/24/2020 CLINICAL DATA:  Follow-up left ureteral calculus. EXAM: ABDOMEN - 1 VIEW COMPARISON:  CT scan 05/22/2020 FINDINGS: Persistent left ureteral calculus noted at the level of the L3 transverse process. No definite renal or bladder calculi. The lung bases are clear. The bowel gas pattern is unremarkable. The bony structures are intact IMPRESSION: Persistent left ureteral calculus. Electronically Signed   By: Rudie Meyer M.D.   On: 05/24/2020 14:55   CT Abdomen Pelvis W Contrast  Result Date: 05/08/2020 CLINICAL DATA:  Right lower quadrant abdominal pain EXAM: CT ABDOMEN AND PELVIS WITH CONTRAST TECHNIQUE: Multidetector CT imaging of the abdomen and pelvis was performed using the standard protocol following bolus administration of intravenous contrast. CONTRAST:  OMNIPAQUE IOHEXOL 300 MG/ML  SOLN COMPARISON:  03/15/2017 FINDINGS: Lower chest: The visualized lung bases are clear bilaterally. Mild coronary artery calcification. Global cardiac size within normal limits. No pericardial effusion. Hepatobiliary: No focal liver abnormality is seen. No gallstones, gallbladder wall thickening, or biliary dilatation.  Pancreas: Unremarkable Spleen: Unremarkable Adrenals/Urinary Tract: The adrenal glands are unremarkable. The kidneys are normal in size and position. Scattered hypodensities are seen within the cortices bilaterally which are too small to characterize but likely represent tiny cortical cysts. A nonobstructing 8 mm x 8 mm x 9 mm calculus is seen within the left ureteropelvic junction. There are punctate 2 mm nonobstructing calculi noted within the kidneys bilaterally. No hydronephrosis. No urolithiasis on the right. The bladder is unremarkable. Stomach/Bowel: Stomach is within normal limits. Appendix appears normal. No evidence of bowel wall thickening,  distention, or inflammatory changes. No free intraperitoneal gas or fluid. Vascular/Lymphatic: No significant vascular findings are present. No enlarged abdominal or pelvic lymph nodes. Reproductive: Prostate is unremarkable. Other: Rectum unremarkable. Musculoskeletal: No acute bone abnormality IMPRESSION: No radiographic explanation for the patient's reported right lower quadrant abdominal pain. Bilateral minimal nonobstructing nephrolithiasis. Superimposed nonobstructing left urolithiasis with 9 mm calculus noted at the ureteropelvic junction. Electronically Signed   By: Helyn Numbers MD   On: 05/08/2020 04:00   DG OR UROLOGY CYSTO IMAGE ([facility] ONLY)  Result Date: 05/28/2020 There is no interpretation for this exam.  This order is for images obtained during a surgical procedure.  Please See "Surgeries" Tab for more information regarding the procedure.   ECHOCARDIOGRAM COMPLETE  Result Date: 05/27/2020    ECHOCARDIOGRAM REPORT   Patient Name:   RIEL HIRSCHMAN Date of Exam: 05/27/2020 Medical Rec #:  [id number]           Height:       70.0 in Accession #:    [id number]          Weight:       220.0 lb Date of Birth:  06-Jul-1965          BSA:          2.174 m Patient Age:    54 years            BP:           140/85 mmHg Patient Gender: M                   HR:           57 bpm. Exam Location:  [facility] Procedure: 2D Echo, Cardiac Doppler and Color Doppler Indications:     Atrial Fibrillation 427.31  History:         Patient has prior history of Echocardiogram examinations, most                  recent 10/01/2015. Arrythmias:Atrial Fibrillation. Headache.  Sonographer:     Cristela Blue RDCS (AE) Referring Phys:  [id number] Antonieta Iba Diagnosing Phys: Julien Nordmann MD  Sonographer Comments: Suboptimal apical window. IMPRESSIONS  1. Left ventricular ejection fraction, by estimation, is 55 %. The left ventricle has normal function. The left ventricle has no regional wall motion abnormalities. There is moderate left  ventricular hypertrophy.  2. Right ventricular systolic function is normal. The right ventricular size is normal.  3. The mitral valve is normal in structure. No evidence of mitral valve regurgitation. No evidence of mitral stenosis.  4. Left atrial size was mildly dilated.  5. Rhythm is atrial fibrillation/flutter FINDINGS  Left Ventricle: Left ventricular ejection fraction, by estimation, is 55 to 60%. The left ventricle has normal function. The left ventricle has no regional wall motion abnormalities. The left ventricular internal cavity size was normal in size. There is  moderate left ventricular hypertrophy. Left  ventricular diastolic parameters are indeterminate. Right Ventricle: The right ventricular size is normal. No increase in right ventricular wall thickness. Right ventricular systolic function is normal. Left Atrium: Left atrial size was mildly dilated. Right Atrium: Right atrial size was normal in size. Pericardium: There is no evidence of pericardial effusion. Mitral Valve: The mitral valve is normal in structure. No evidence of mitral valve regurgitation. No evidence of mitral valve stenosis. Tricuspid Valve: The tricuspid valve is normal in structure. Tricuspid valve regurgitation is not demonstrated. No evidence of tricuspid stenosis. Aortic Valve: The aortic valve is normal in structure. Aortic valve regurgitation is not visualized. No aortic stenosis is present. Aortic valve mean gradient measures 4.5 mmHg. Aortic valve peak gradient measures 8.6 mmHg. Aortic valve area, by VTI measures 2.32 cm. Pulmonic Valve: The pulmonic valve was normal in structure. Pulmonic valve regurgitation is not visualized. No evidence of pulmonic stenosis. Aorta: The aortic root is normal in size and structure. Venous: The inferior vena cava is normal in size with greater than 50% respiratory variability, suggesting right atrial pressure of 3 mmHg. IAS/Shunts: No atrial level shunt detected by color flow Doppler.   LEFT VENTRICLE PLAX 2D LVIDd:         4.59 cm LVIDs:         2.83 cm LV PW:         1.41 cm LV IVS:        1.27 cm LVOT diam:     2.10 cm LV SV:         42 LV SV Index:   19 LVOT Area:     3.46 cm  RIGHT VENTRICLE RV Basal diam:  3.57 cm RV S prime:     20.50 cm/s TAPSE (M-mode): 5.2 cm LEFT ATRIUM             Index       RIGHT ATRIUM           Index LA diam:        4.60 cm 2.12 cm/m  RA Area:     18.60 cm LA Vol (A2C):   66.6 ml 30.64 ml/m RA Volume:   47.40 ml  21.81 ml/m LA Vol (A4C):   56.5 ml 25.99 ml/m LA Biplane Vol: 63.6 ml 29.26 ml/m  AORTIC VALVE                   PULMONIC VALVE AV Area (Vmax):    2.28 cm    PV Vmax:        0.73 m/s AV Area (Vmean):   2.29 cm    PV Peak grad:   2.1 mmHg AV Area (VTI):     2.32 cm    RVOT Peak grad: 3 mmHg AV Vmax:           147.00 cm/s AV Vmean:          91.350 cm/s AV VTI:            0.180 m AV Peak Grad:      8.6 mmHg AV Mean Grad:      4.5 mmHg LVOT Vmax:         96.80 cm/s LVOT Vmean:        60.400 cm/s LVOT VTI:          0.120 m LVOT/AV VTI ratio: 0.67  AORTA Ao Root diam: 3.20 cm MITRAL VALVE               TRICUSPID VALVE MV Area (  PHT): 4.49 cm    TR Peak grad:   6.6 mmHg MV Decel Time: 169 msec    TR Vmax:        128.00 cm/s MV E velocity: 88.80 cm/s                            SHUNTS                            Systemic VTI:  0.12 m                            Systemic Diam: 2.10 cm Julien Nordmann MD Electronically signed by Julien Nordmann MD Signature Date/Time: 05/27/2020/12:59:50 PM    Final    CT Renal Stone Study  Result Date: 05/22/2020 CLINICAL DATA:  Sudden onset of left back and flank pain. EXAM: CT ABDOMEN AND PELVIS WITHOUT CONTRAST TECHNIQUE: Multidetector CT imaging of the abdomen and pelvis was performed following the standard protocol without IV contrast. COMPARISON:  May 08, 2020 FINDINGS: Lower chest: No acute abnormality. Hepatobiliary: No focal liver abnormality is seen. No gallstones, gallbladder wall thickening, or biliary  dilatation. Pancreas: Unremarkable. No pancreatic ductal dilatation or surrounding inflammatory changes. Spleen: Normal in size without focal abnormality. Adrenals/Urinary Tract: Normal adrenal glands. Tiny nonobstructive calculus, 1-2 mm in the midpolar region of right kidney. A collection of 213 ureteral stones in the proximal left ureter, slightly past the UPJ, the largest calculus measures 9 mm. Subsequent obstructive uropathy of the left kidney with moderate left hydronephrosis and proximal hydroureter. Additional nonobstructive 1-2 mm calculus in the upper pole of the left kidney. Normal appearance of the urinary bladder. Stomach/Bowel: Stomach is within normal limits. No evidence of appendicitis. No evidence of bowel wall thickening, distention, or inflammatory changes. Vascular/Lymphatic: No significant vascular findings are present. No enlarged abdominal or pelvic lymph nodes. Reproductive: Prostate is unremarkable. Other: No abdominal wall hernia or abnormality. No abdominopelvic ascites. Musculoskeletal: Spondylosis of the thoracolumbar spine. IMPRESSION: 1. A collection of 2-3 ureteral stones in the proximal left ureter, slightly past the UPJ, with the largest calculus measuring 9 mm. Subsequent obstructive uropathy of the left kidney with moderate left hydronephrosis and proximal hydroureter. 2. Additional tiny nonobstructive bilateral renal calculi. Electronically Signed   By: Ted Mcalpine M.D.   On: 05/22/2020 15:13       Subjective: Pt c/o fatigue   Discharge Exam: Vitals:   06/01/20 0412 06/01/20 0828  BP: 103/84 116/84  Pulse: 74 81  Resp: 18 16  Temp: (!) 97.5 F (36.4 C) 98 F (36.7 C)  SpO2: 97% 96%   Vitals:   05/31/20 2038 06/01/20 0032 06/01/20 0412 06/01/20 0828  BP: (!) 85/68 111/77 103/84 116/84  Pulse: 85 76 74 81  Resp: 18 18 18 16   Temp: 97.9 F (36.6 C) 97.8 F (36.6 C) (!) 97.5 F (36.4 C) 98 F (36.7 C)  TempSrc: Oral Oral  Oral  SpO2: 99% 100%  97% 96%  Weight:      Height:        General: Pt is alert, awake, not in acute distress Cardiovascular: S1/S2 +, no rubs, no gallops Respiratory: CTA bilaterally, no wheezing, no rhonchi Abdominal: Soft, NT, obese, bowel sounds + Extremities: no cyanosis    The results of significant diagnostics from this hospitalization (including imaging, microbiology, ancillary and laboratory) are listed below  for reference.     Microbiology: Recent Results (from the past 240 hour(s))  Resp Panel by RT-PCR (Flu A&B, Covid) Nasopharyngeal Swab     Status: None   Collection Time: 05/22/20  9:33 PM   Specimen: Nasopharyngeal Swab; Nasopharyngeal(NP) swabs in vial transport medium  Result Value Ref Range Status   SARS Coronavirus 2 by RT PCR NEGATIVE NEGATIVE Final    Comment: (NOTE) SARS-CoV-2 target nucleic acids are NOT DETECTED.  The SARS-CoV-2 RNA is generally detectable in upper respiratory specimens during the acute phase of infection. The lowest concentration of SARS-CoV-2 viral copies this assay can detect is 138 copies/mL. A negative result does not preclude SARS-Cov-2 infection and should not be used as the sole basis for treatment or other patient management decisions. A negative result may occur with  improper specimen collection/handling, submission of specimen other than nasopharyngeal swab, presence of viral mutation(s) within the areas targeted by this assay, and inadequate number of viral copies(<138 copies/mL). A negative result must be combined with clinical observations, patient history, and epidemiological information. The expected result is Negative.  Fact Sheet for Patients:  BloggerCourse.comhttps://www.fda.gov/media/152166/download  Fact Sheet for Healthcare Providers:  SeriousBroker.ithttps://www.fda.gov/media/152162/download  This test is no t yet approved or cleared by the [place] FDA and  has been authorized for detection and/or diagnosis of SARS-CoV-2 by FDA under an Emergency Use  Authorization (EUA). This EUA will remain  in effect (meaning this test can be used) for the duration of the COVID-19 declaration under Section 564(b)(1) of the Act, 21 U.S.C.section 360bbb-3(b)(1), unless the authorization is terminated  or revoked sooner.       Influenza A by PCR NEGATIVE NEGATIVE Final   Influenza B by PCR NEGATIVE NEGATIVE Final    Comment: (NOTE) The Xpert Xpress SARS-CoV-2/FLU/RSV plus assay is intended as an aid in the diagnosis of influenza from Nasopharyngeal swab specimens and should not be used as a sole basis for treatment. Nasal washings and aspirates are unacceptable for Xpert Xpress SARS-CoV-2/FLU/RSV testing.  Fact Sheet for Patients: BloggerCourse.comhttps://www.fda.gov/media/152166/download  Fact Sheet for Healthcare Providers: SeriousBroker.ithttps://www.fda.gov/media/152162/download  This test is not yet approved or cleared by the [place] FDA and has been authorized for detection and/or diagnosis of SARS-CoV-2 by FDA under an Emergency Use Authorization (EUA). This EUA will remain in effect (meaning this test can be used) for the duration of the COVID-19 declaration under Section 564(b)(1) of the Act, 21 U.S.C. section 360bbb-3(b)(1), unless the authorization is terminated or revoked.  Performed at [facility], [address] [place], [place] [address]   Urine Culture     Status: None   Collection Time: 05/30/20 11:09 AM   Specimen: Urine, Random  Result Value Ref Range Status   Specimen Description   Final    URINE, RANDOM Performed at [facility], [address] [place], [place] [address]    Special Requests   Final    NONE Performed at [facility], [address] [place], [place] [address]    Culture   Final    NO GROWTH Performed at [facility], [address] [place], [place] [address]    Report Status 05/31/2020 FINAL  Final     Labs: BNP (last 3 results) Recent Labs    05/08/20 0242  BNP 26.2    Basic Metabolic Panel: Recent Labs  Lab 05/28/20 0529 05/29/20 0402 05/30/20 0333 05/31/20 0447 06/01/20 0518  NA 136 137 136 136 137  K 4.5 4.2 4.2 3.9  3.9  CL 106 104 105 103 104  CO2 19* 22 23 24 23   GLUCOSE 87 106* 95 104* 92  BUN 19 22* 22* 23* 25*  CREATININE 0.83 0.84 0.84 0.84 0.79  CALCIUM 9.9 10.2 10.1 10.3 10.0   Liver Function Tests: Recent Labs  Lab 05/26/20 0502  AST 41  ALT 52*  ALKPHOS 50  BILITOT 1.4*  PROT 6.9  ALBUMIN 3.9   No results for input(s): LIPASE, AMYLASE in the last 168 hours. No results for input(s): AMMONIA in the last 168 hours. CBC: Recent Labs  Lab 05/28/20 0529 05/29/20 0402 05/30/20 0333 05/31/20 0447 06/01/20 0518  WBC 8.5 14.8* 11.0* 8.9 9.0  HGB 16.0 15.3 14.8 15.6 15.7  HCT 47.1 45.1 43.1 45.3 44.6  MCV 91.1 92.0 89.8 89.9 88.7  PLT 198 171 206 223 203   Cardiac Enzymes: No results for input(s): CKTOTAL, CKMB, CKMBINDEX, TROPONINI in the last 168 hours. BNP: Invalid input(s): POCBNP CBG: Recent Labs  Lab 05/28/20 0740 05/28/20 1659  GLUCAP 84 130*   D-Dimer No results for input(s): DDIMER in the last 72 hours. Hgb A1c No results for input(s): HGBA1C in the last 72 hours. Lipid Profile No results for input(s): CHOL, HDL, LDLCALC, TRIG, CHOLHDL, LDLDIRECT in the last 72 hours. Thyroid function studies No results for input(s): TSH, T4TOTAL, T3FREE, THYROIDAB in the last 72 hours.  Invalid input(s): FREET3 Anemia work up No results for input(s): VITAMINB12, FOLATE, FERRITIN, TIBC, IRON, RETICCTPCT in the last 72 hours. Urinalysis    Component Value Date/Time   COLORURINE YELLOW (A) 05/22/2020 1337   APPEARANCEUR CLEAR (A) 05/22/2020 1337   APPEARANCEUR Clear 07/22/2019 1432   LABSPEC 1.026 05/22/2020 1337   LABSPEC 1.019 07/30/2013 2022   PHURINE 5.0 05/22/2020 1337   GLUCOSEU NEGATIVE 05/22/2020 1337   GLUCOSEU 150 mg/dL 05/24/2020 16/55/3748   HGBUR LARGE (A) 05/22/2020 1337   BILIRUBINUR NEGATIVE  05/22/2020 1337   BILIRUBINUR Negative 07/22/2019 1432   BILIRUBINUR Negative 07/30/2013 2022   KETONESUR NEGATIVE 05/22/2020 1337   PROTEINUR 30 (A) 05/22/2020 1337   NITRITE NEGATIVE 05/22/2020 1337   LEUKOCYTESUR NEGATIVE 05/22/2020 1337   LEUKOCYTESUR Negative 07/30/2013 2022   Sepsis Labs Invalid input(s): PROCALCITONIN,  WBC,  LACTICIDVEN Microbiology Recent Results (from the past 240 hour(s))  Resp Panel by RT-PCR (Flu A&B, Covid) Nasopharyngeal Swab     Status: None   Collection Time: 05/22/20  9:33 PM   Specimen: Nasopharyngeal Swab; Nasopharyngeal(NP) swabs in vial transport medium  Result Value Ref Range Status   SARS Coronavirus 2 by RT PCR NEGATIVE NEGATIVE Final    Comment: (NOTE) SARS-CoV-2 target nucleic acids are NOT DETECTED.  The SARS-CoV-2 RNA is generally detectable in upper respiratory specimens during the acute phase of infection. The lowest concentration of SARS-CoV-2 viral copies this assay can detect is 138 copies/mL. A negative result does not preclude SARS-Cov-2 infection and should not be used as the sole basis for treatment or other patient management decisions. A negative result may occur with  improper specimen collection/handling, submission of specimen other than nasopharyngeal swab, presence of viral mutation(s) within the areas targeted by this assay, and inadequate number of viral copies(<138 copies/mL). A negative result must be combined with clinical observations, patient history, and epidemiological information. The expected result is Negative.  Fact Sheet for Patients:  05/24/20  Fact Sheet for Healthcare Providers:  BloggerCourse.com  This test is no t yet approved or cleared by the [place] FDA and  has been  authorized for detection and/or diagnosis of SARS-CoV-2 by FDA under an Emergency Use Authorization (EUA). This EUA will remain  in effect (meaning this test can be  used) for the duration of the COVID-19 declaration under Section 564(b)(1) of the Act, 21 U.S.C.section 360bbb-3(b)(1), unless the authorization is terminated  or revoked sooner.       Influenza A by PCR NEGATIVE NEGATIVE Final   Influenza B by PCR NEGATIVE NEGATIVE Final    Comment: (NOTE) The Xpert Xpress SARS-CoV-2/FLU/RSV plus assay is intended as an aid in the diagnosis of influenza from Nasopharyngeal swab specimens and should not be used as a sole basis for treatment. Nasal washings and aspirates are unacceptable for Xpert Xpress SARS-CoV-2/FLU/RSV testing.  Fact Sheet for Patients: BloggerCourse.com  Fact Sheet for Healthcare Providers: SeriousBroker.it  This test is not yet approved or cleared by the [place] FDA and has been authorized for detection and/or diagnosis of SARS-CoV-2 by FDA under an Emergency Use Authorization (EUA). This EUA will remain in effect (meaning this test can be used) for the duration of the COVID-19 declaration under Section 564(b)(1) of the Act, 21 U.S.C. section 360bbb-3(b)(1), unless the authorization is terminated or revoked.  Performed at [facility], [address] [place], [place] [address]   Urine Culture     Status: None   Collection Time: 05/30/20 11:09 AM   Specimen: Urine, Random  Result Value Ref Range Status   Specimen Description   Final    URINE, RANDOM Performed at [facility], [address], [place], [place] [address]    Special Requests   Final    NONE Performed at [facility], [address] [place], [place] [address]    Culture   Final    NO GROWTH Performed at [facility], [address], [place], [place] [address]    Report Status 05/31/2020 FINAL  Final     Time coordinating discharge: Over 30 minutes  SIGNED:   Charise Killian, MD  [facility] 06/01/2020, 2:49 PM Pager   If 7PM-7AM, please  contact night-coverage

## 2020-06-01 NOTE — Telephone Encounter (Signed)
Spoke with patient regarding a hospital follow up and how to obtain services from out clinic. Patient will pick up app,ication and proceed from there

## 2020-06-01 NOTE — TOC Transition Note (Signed)
Transition of Care [facility]) - CM/SW Discharge Note   Patient Details  Name: Rickey Lane MRN: [id number] Date of Birth: 1966/03/26  Transition of Care [facility]) CM/SW Contact:  Trenton Founds, RN Phone Number: 06/01/2020, 1:41 PM   Clinical Narrative:   RNCM reached out to patient per request of MD. Patient with questions about billing procedures through hospital as well as obtaining medications. Patient provided RNCM with updated address information which was updated in [facility]. Discussed with patient that he would receive billing from hospital and then could contact them to make payment arrangements. Patient with questions about medication management clinic which were answered as appropriate. No other needs identified at this time.           Patient Goals and CMS Choice        Discharge Placement                       Discharge Plan and Services                                     Social Determinants of Health (SDOH) Interventions     Readmission Risk Interventions No flowsheet data found.

## 2020-06-01 NOTE — Progress Notes (Signed)
Discharge note:  Patient discharged to friend's home today. Discharge instructions provided to patient. Verbalized understanding. IV removed. Driven home by self. Wheeled to car by staff.   Arlana Hove, RN

## 2020-06-02 ENCOUNTER — Telehealth: Payer: Self-pay

## 2020-06-02 NOTE — Telephone Encounter (Signed)
No HFU scheduled at this time. 

## 2020-06-02 NOTE — Telephone Encounter (Signed)
Transition Care Management Follow-up Telephone Call  Date of discharge and from where: [facility] on 06/01/20  How have you been since you were released from the hospital? Pt states he is still in pain. Pain level is a 5 in his lower abdomen and lower back. Pt is taking Percocet for pain and states it does help. Pt states he has burning when he urinates but the color and smell is normal. Pt also has urinary frequency and urgency. Declines fever, pain elsewhere or n/v/d.  Any questions or concerns? Pt is ready to get the stent out. Removal is scheduled for 06/04/20.  Items Reviewed:  Did the pt receive and understand the discharge instructions provided? Yes   Medications obtained and verified? No, declined reviewing at this time.   Any new allergies since your discharge? No   Dietary orders reviewed? Yes  Do you have support at home? Yes   Other (ie: DME, Home Health, etc): N/A  Functional Questionnaire: (I = Independent and D = Dependent)  Bathing/Dressing- I   Meal Prep- I  Eating- I  Maintaining continence- I  Transferring/Ambulation- I  Managing Meds- I   Follow up appointments reviewed:    PCP Hospital f/u appt confirmed? Yes  scheduled to see Joycelyn Man on 06/07/20 @ 11:00 AM.  Specialist Hospital f/u appt confirmed? Yes    Are transportation arrangements needed? No   If their condition worsens, is the pt aware to call  their PCP or go to the ED? Yes  Was the patient provided with contact information for the PCP's office or ED? Yes  Was the pt encouraged to call back with questions or concerns? Yes

## 2020-06-04 ENCOUNTER — Encounter: Payer: Self-pay | Admitting: Urology

## 2020-06-04 ENCOUNTER — Other Ambulatory Visit: Payer: Self-pay

## 2020-06-04 ENCOUNTER — Ambulatory Visit (INDEPENDENT_AMBULATORY_CARE_PROVIDER_SITE_OTHER): Payer: Self-pay | Admitting: Urology

## 2020-06-04 VITALS — BP 109/75 | HR 80 | Ht 70.0 in | Wt 214.0 lb

## 2020-06-04 DIAGNOSIS — Z466 Encounter for fitting and adjustment of urinary device: Secondary | ICD-10-CM

## 2020-06-04 DIAGNOSIS — N200 Calculus of kidney: Secondary | ICD-10-CM

## 2020-06-04 LAB — URINALYSIS, COMPLETE
Bilirubin, UA: NEGATIVE
Glucose, UA: NEGATIVE
Ketones, UA: NEGATIVE
Nitrite, UA: NEGATIVE
Specific Gravity, UA: 1.02 (ref 1.005–1.030)
Urobilinogen, Ur: 0.2 mg/dL (ref 0.2–1.0)
pH, UA: 6 (ref 5.0–7.5)

## 2020-06-04 LAB — MICROSCOPIC EXAMINATION: Epithelial Cells (non renal): NONE SEEN /hpf (ref 0–10)

## 2020-06-04 MED ORDER — CEPHALEXIN 250 MG PO CAPS
500.0000 mg | ORAL_CAPSULE | Freq: Once | ORAL | Status: AC
  Administered 2020-06-04: 500 mg via ORAL

## 2020-06-04 MED ORDER — LIDOCAINE HCL URETHRAL/MUCOSAL 2 % EX GEL
1.0000 "application " | Freq: Once | CUTANEOUS | Status: AC
  Administered 2020-06-04: 1 via URETHRAL

## 2020-06-04 NOTE — Patient Instructions (Addendum)
Ureteral Stent Implantation, Care After This sheet gives you information about how to care for yourself after your procedure. Your health care provider may also give you more specific instructions. If you have problems or questions, contact your health care provider. What can I expect after the procedure? After the procedure, it is common to have:  Nausea.  Mild pain when you urinate. You may feel this pain in your lower back or lower abdomen. The pain should stop within a few minutes after you urinate. This may last for up to 1 week.  A small amount of blood in your urine for several days. Follow these instructions at home: Medicines  Take over-the-counter and prescription medicines only as told by your health care provider.  If you were prescribed an antibiotic medicine, take it as told by your health care provider. Do not stop taking the antibiotic even if you start to feel better.  Do not drive for 24 hours if you were given a sedative during your procedure.  Ask your health care provider if the medicine prescribed to you requires you to avoid driving or using heavy machinery. Activity  Rest as told by your health care provider.  Avoid sitting for a long time without moving. Get up to take short walks every 1-2 hours. This is important to improve blood flow and breathing. Ask for help if you feel weak or unsteady.  Return to your normal activities as told by your health care provider. Ask your health care provider what activities are safe for you. General instructions   Watch for any blood in your urine. Call your health care provider if the amount of blood in your urine increases.  If you have a catheter: ? Follow instructions from your health care provider about taking care of your catheter and collection bag. ? Do not take baths, swim, or use a hot tub until your health care provider approves. Ask your health care provider if you may take showers. You may only be allowed to  take sponge baths.  Drink enough fluid to keep your urine pale yellow.  Do not use any products that contain nicotine or tobacco, such as cigarettes, e-cigarettes, and chewing tobacco. These can delay healing after surgery. If you need help quitting, ask your health care provider.  Keep all follow-up visits as told by your health care provider. This is important. Contact a health care provider if:  You have pain that gets worse or does not get better with medicine, especially pain when you urinate.  You have difficulty urinating.  You feel nauseous or you vomit repeatedly during a period of more than 2 days after the procedure. Get help right away if:  Your urine is dark red or has blood clots in it.  You are leaking urine (have incontinence).  The end of the stent comes out of your urethra.  You cannot urinate.  You have sudden, sharp, or severe pain in your abdomen or lower back.  You have a fever.  You have swelling or pain in your legs.  You have difficulty breathing. Summary  After the procedure, it is common to have mild pain when you urinate that goes away within a few minutes after you urinate. This may last for up to 1 week.  Watch for any blood in your urine. Call your health care provider if the amount of blood in your urine increases.  Take over-the-counter and prescription medicines only as told by your health care provider.  Drink   enough fluid to keep your urine pale yellow. This information is not intended to replace advice given to you by your health care provider. Make sure you discuss any questions you have with your health care provider. Document Revised: 03/19/2018 Document Reviewed: 03/20/2018 Elsevier Patient Education  2020 [employer].   Cystoscopy Cystoscopy is a procedure that is used to help diagnose and sometimes treat conditions that affect the lower urinary tract. The lower urinary tract includes the bladder and the urethra. The urethra is  the tube that drains urine from the bladder. Cystoscopy is done using a thin, tube-shaped instrument with a light and camera at the end (cystoscope). The cystoscope may be hard or flexible, depending on the goal of the procedure. The cystoscope is inserted through the urethra, into the bladder. Cystoscopy was recommended to remove the left ureteral stent.   What can I expect after the procedure? After the procedure, it is common to have:  Some soreness or pain in your abdomen and urethra.  Urinary symptoms. These include: ? Mild pain or burning when you urinate. Pain should stop within a few minutes after you urinate. This may last for up to 1 week. ? A small amount of blood in your urine for several days. ? Feeling like you need to urinate but producing only a small amount of urine. Follow these instructions at home: Medicines  Take over-the-counter and prescription medicines only as told by your health care provider.  If you were prescribed an antibiotic medicine, take it as told by your health care provider. Do not stop taking the antibiotic even if you start to feel better. General instructions  Return to your normal activities as told by your health care provider. Ask your health care provider what activities are safe for you.  Do not drive for 24 hours if you were given a sedative during your procedure.  Watch for any blood in your urine. If the amount of blood in your urine increases, call your health care provider.  Follow instructions from your health care provider about eating or drinking restrictions.  If a tissue sample was removed for testing (biopsy) during your procedure, it is up to you to get your test results. Ask your health care provider, or the department that is doing the test, when your results will be ready.  Drink enough fluid to keep your urine pale yellow.  Keep all follow-up visits as told by your health care provider. This is important. Contact a health  care provider if you:  Have pain that gets worse or does not get better with medicine, especially pain when you urinate.  Have trouble urinating.  Have more blood in your urine. Get help right away if you:  Have blood clots in your urine.  Have abdominal or flank pain.  Have a fever or chills.  Are unable to urinate. Summary  Cystoscopy is a procedure that is used to help diagnose and sometimes treat conditions that affect the lower urinary tract.  Cystoscopy is done using a thin, tube-shaped instrument with a light and camera at the end.  After the procedure, it is common to have some soreness or pain in your abdomen and urethra.  Watch for any blood in your urine. If the amount of blood in your urine increases, call your health care provider.  If you were prescribed an antibiotic medicine, take it as told by your health care provider. Do not stop taking the antibiotic even if you start to feel better.  This information is not intended to replace advice given to you by your health care provider. Make sure you discuss any questions you have with your health care provider. Document Revised: 06/04/2018 Document Reviewed: 06/04/2018 Elsevier Patient Education  2020 [employer].

## 2020-06-04 NOTE — Progress Notes (Signed)
Cystoscopy/ Stent removal procedure  Rickey Lane presents for cystoscopy left stent removal.  He underwent a left ureteroscopy laser lithotripsy and stent placement 05/28/2020. Urine cx was negative. He has no fever or dysuria apart from stent symptoms.   Patient identification was confirmed, informed consent was obtained, and patient was prepped using Betadine solution.  Lidocaine jelly was administered per urethral meatus.    Preoperative abx where received prior to procedure.    Procedure: - Flexible cystoscope introduced, without any difficulty.   - Thorough search of the bladder revealed:    normal urethral meatus  Stent seen emanating from LEFT ureteral orifice, grasped with stent graspers, and removed in entirety. Moderate debris in bladder limiting visualization.  Edson Snowball was assist/chaperone.   Post-Procedure: - Patient tolerated the procedure well  A/P -   Status post left stent removal.  He had small punctate stones in both kidneys.  He will need follow-up ultrasound in 4 to 6 weeks with PA McGowan.

## 2020-06-07 ENCOUNTER — Inpatient Hospital Stay: Payer: Self-pay | Admitting: Physician Assistant

## 2020-06-07 ENCOUNTER — Telehealth: Payer: Self-pay

## 2020-06-07 NOTE — Telephone Encounter (Signed)
Copied from CRM [id number]. Topic: Quick Communication - Appointment Cancellation >> Jun 07, 2020  7:47 AM Crist Infante wrote: Patient called to cancel his appointment scheduled for today at 11 am. Patient said he could not come. pt Was offered a virtual, and pt declined. Pt said he wanted to come in. This is a hospital follow up appt, and resc for Wed at 11 am.

## 2020-06-08 NOTE — Progress Notes (Deleted)
     Established patient visit   Patient: Rickey Lane   DOB: 11/19/1965   53 y.o. Male  MRN: [id number] Visit Date: 06/09/2020  Today's healthcare provider: Margaretann Loveless, PA-C   No chief complaint on file.  Subjective    HPI  Follow up Hospitalization  Patient was admitted to [facility] on 05/22/20 and discharged on 06/01/20. He was treated for ureterolithiasis Treatment for this included Per ER note "left ureteroscopy, laser lithotripsy, retrograde pyelogram & left ureteral stent placement on 05/28/20 by urology. Urine cx showed no growth. Pt will f/u w/ urology on 06/04/20 for stent removal." Telephone follow up was done on 06/02/2020 He reports {excellent/good/fair:19992} compliance with treatment. He reports this condition is {resolved/improved/worsened:23923}.  ----------------------------------------------------------------------------------------- -   {Show patient history (optional):23778::" "}   Medications: Outpatient Medications Prior to Visit  Medication Sig  . acetaminophen (TYLENOL) 500 MG tablet Take 500-1,000 mg by mouth every 6 (six) hours as needed for mild pain or moderate pain.   . famotidine (PEPCID) 20 MG tablet Take 20 mg by mouth 2 (two) times daily as needed for heartburn or indigestion.  . Garlic 500 MG CAPS Take 1 capsule by mouth as directed.  . Ginger 500 MG CAPS Take 1 capsule by mouth daily.  . Glucosamine-Chondroitin (OSTEO BI-FLEX REGULAR STRENGTH) 250-200 MG TABS Take 1 tablet by mouth as directed.  . metoprolol tartrate (LOPRESSOR) 50 MG tablet Take 1 tablet (50 mg total) by mouth 2 (two) times daily.  . Multiple Vitamin (MULTI-VITAMINS) TABS Take 1 tablet by mouth daily.   Marland Kitchen oxybutynin (DITROPAN-XL) 10 MG 24 hr tablet Take 1 tablet (10 mg total) by mouth at bedtime.  . sildenafil (VIAGRA) 100 MG tablet Take 1 tablet (100 mg total) by mouth daily as needed for erectile dysfunction. Take two hours prior to intercourse on an empty  stomach  . tamsulosin (FLOMAX) 0.4 MG CAPS capsule Take 1 capsule (0.4 mg total) by mouth daily. Discontinue after symptoms improve  . zinc sulfate 220 (50 Zn) MG capsule Take 220 mg by mouth daily.   No facility-administered medications prior to visit.    Review of Systems  {Heme  Chem  Endocrine  Serology  Results Review (optional):23779::" "}  Objective    There were no vitals taken for this visit. {Show previous vital signs (optional):23777::" "}  Physical Exam  ***  No results found for any visits on 06/09/20.  Assessment & Plan     ***  No follow-ups on file.      {provider attestation***:1}   Reine Just  [facility] [phone] (phone) [phone] (fax)  [facility]

## 2020-06-09 ENCOUNTER — Inpatient Hospital Stay: Payer: Self-pay | Admitting: Physician Assistant

## 2020-06-10 ENCOUNTER — Encounter: Payer: Self-pay | Admitting: Urology

## 2020-06-11 ENCOUNTER — Other Ambulatory Visit: Payer: Self-pay

## 2020-06-11 ENCOUNTER — Encounter: Payer: Self-pay | Admitting: Physician Assistant

## 2020-06-11 ENCOUNTER — Ambulatory Visit (INDEPENDENT_AMBULATORY_CARE_PROVIDER_SITE_OTHER): Payer: Self-pay | Admitting: Physician Assistant

## 2020-06-11 VITALS — BP 128/88 | HR 91 | Temp 98.5°F | Resp 16 | Wt 215.2 lb

## 2020-06-11 DIAGNOSIS — M25571 Pain in right ankle and joints of right foot: Secondary | ICD-10-CM

## 2020-06-11 DIAGNOSIS — Z7689 Persons encountering health services in other specified circumstances: Secondary | ICD-10-CM

## 2020-06-11 DIAGNOSIS — G8929 Other chronic pain: Secondary | ICD-10-CM

## 2020-06-11 DIAGNOSIS — I4819 Other persistent atrial fibrillation: Secondary | ICD-10-CM

## 2020-06-11 DIAGNOSIS — N200 Calculus of kidney: Secondary | ICD-10-CM

## 2020-06-11 NOTE — Progress Notes (Signed)
Established patient visit   Patient: Rickey Lane   DOB: 02/21/1966   54 y.o. Male  MRN: [id number] Visit Date: 06/11/2020  Today's healthcare provider: Margaretann Loveless, PA-C   Chief Complaint  Patient presents with  . Hospitalization Follow-up   Subjective    HPI  Patient declined influenza and covid vaccine. Follow up Hospitalization  Patient was admitted to [facility] on 05/22/20 and discharged on 06/01/20. He was treated for Ureterolithiasis. Treatment for this included Per ED note "Pt had left ureteroscopy, laser lithotripsy, retrograde pyelogram & left ureteral stent placement on 05/28/20 by urology. Urine cx showed no growth. Pt will f/u w/ urology on 06/04/20 for stent removal" Per patient this went well. Hospitalization was prolonged due to having atrial fibrillation with RVR and had to get HR rate controlled prior to urological procedures. Telephone follow up was done on 06/02/20 He reports excellent compliance with treatment. He reports this condition is improved. He is scheduled to follow up with the urologist in two weeks. ----------------------------------------------------------------------------------------  Patient Active Problem List   Diagnosis Date Noted  . Kidney stones 05/22/2020  . Essential hypertension 05/21/2017  . Erectile dysfunction 05/21/2017  . Benign prostatic hyperplasia with post-void dribbling 05/21/2017  . Left nephrolithiasis 05/21/2017  . Renal cyst, left 05/21/2017  . BMI 29.0-29.9,adult 05/21/2017  . Current every day smoker 05/21/2017  . Persistent atrial fibrillation [facility])    Past Medical History:  Diagnosis Date  . Atrial fibrillation (HCC)    a. initially diagnosed in 09/2015. Reported a history of "irregular HR" for 3+ years  . GERD (gastroesophageal reflux disease)   . Headache   . Kidney stone   . Sleep apnea        Medications: Outpatient Medications Prior to Visit  Medication Sig  . acetaminophen  (TYLENOL) 500 MG tablet Take 500-1,000 mg by mouth every 6 (six) hours as needed for mild pain or moderate pain.   . famotidine (PEPCID) 20 MG tablet Take 20 mg by mouth 2 (two) times daily as needed for heartburn or indigestion.  . Garlic 500 MG CAPS Take 1 capsule by mouth as directed.  . Ginger 500 MG CAPS Take 1 capsule by mouth daily.  . Glucosamine-Chondroitin (OSTEO BI-FLEX REGULAR STRENGTH) 250-200 MG TABS Take 1 tablet by mouth as directed.  . metoprolol tartrate (LOPRESSOR) 50 MG tablet Take 1 tablet (50 mg total) by mouth 2 (two) times daily.  . Multiple Vitamin (MULTI-VITAMINS) TABS Take 1 tablet by mouth daily.   . sildenafil (VIAGRA) 100 MG tablet Take 1 tablet (100 mg total) by mouth daily as needed for erectile dysfunction. Take two hours prior to intercourse on an empty stomach  . zinc sulfate 220 (50 Zn) MG capsule Take 220 mg by mouth daily.  Marland Kitchen oxybutynin (DITROPAN-XL) 10 MG 24 hr tablet Take 1 tablet (10 mg total) by mouth at bedtime.  . tamsulosin (FLOMAX) 0.4 MG CAPS capsule Take 1 capsule (0.4 mg total) by mouth daily. Discontinue after symptoms improve   No facility-administered medications prior to visit.    Review of Systems  Constitutional: Positive for activity change.  HENT: Negative.   Respiratory: Negative.   Cardiovascular: Negative for chest pain, palpitations and leg swelling.  Genitourinary: Negative.   Musculoskeletal: Positive for arthralgias (foot pain).  Neurological: Negative.     Last CBC Lab Results  Component Value Date   WBC 9.0 06/01/2020   HGB 15.7 06/01/2020   HCT 44.6 06/01/2020   MCV 88.7  06/01/2020   MCH 31.2 06/01/2020   RDW 11.9 06/01/2020   PLT 203 06/01/2020   Last metabolic panel Lab Results  Component Value Date   GLUCOSE 92 06/01/2020   NA 137 06/01/2020   K 3.9 06/01/2020   CL 104 06/01/2020   CO2 23 06/01/2020   BUN 25 (H) 06/01/2020   CREATININE 0.79 06/01/2020   GFRNONAA >60 06/01/2020   GFRAA >60 03/15/2017    CALCIUM 10.0 06/01/2020   PROT 6.9 05/26/2020   ALBUMIN 3.9 05/26/2020   BILITOT 1.4 (H) 05/26/2020   ALKPHOS 50 05/26/2020   AST 41 05/26/2020   ALT 52 (H) 05/26/2020   ANIONGAP 10 06/01/2020      Objective    BP 128/88 (BP Location: Right Arm, Patient Position: Sitting, Cuff Size: Large)   Pulse 91   Temp 98.5 F (36.9 C) (Oral)   Resp 16   Wt 215 lb 3.2 oz (97.6 kg)   BMI 30.88 kg/m  BP Readings from Last 3 Encounters:  06/14/20 102/80  06/11/20 128/88  06/04/20 109/75   Wt Readings from Last 3 Encounters:  06/14/20 216 lb 8 oz (98.2 kg)  06/11/20 215 lb 3.2 oz (97.6 kg)  06/04/20 214 lb (97.1 kg)      Physical Exam Vitals reviewed.  Constitutional:      General: He is not in acute distress.    Appearance: Normal appearance. He is well-developed and well-nourished. He is obese. He is not ill-appearing or diaphoretic.  HENT:     Head: Normocephalic and atraumatic.  Neck:     Thyroid: No thyromegaly.     Vascular: No JVD.     Trachea: No tracheal deviation.  Cardiovascular:     Rate and Rhythm: Normal rate. Rhythm irregularly irregular.     Pulses: Normal pulses.     Heart sounds: Normal heart sounds. No murmur heard. No friction rub. No gallop.   Pulmonary:     Effort: Pulmonary effort is normal. No respiratory distress.     Breath sounds: Normal breath sounds. No wheezing or rales.  Musculoskeletal:     Cervical back: Normal range of motion and neck supple.     Right lower leg: No edema.     Left lower leg: No edema.  Lymphadenopathy:     Cervical: No cervical adenopathy.  Neurological:     Mental Status: He is alert.  Psychiatric:        Mood and Affect: Mood normal.        Thought Content: Thought content normal.      No results found for any visits on 06/11/20.  Assessment & Plan     1. Encounter for support and coordination of transition of care Hospital notes, consults, imaging, and labs from 05/22/20-06/01/20 all reviewed prior to  patient arrival. Telephone call performed on 06/02/20 and reviewed by me as well.   2. Chronic pain of right ankle Having chronic pain and occasional swelling in the right ankle. Will refer to podiatry for further evaluation. - Ambulatory referral to Podiatry  3. Persistent atrial fibrillation (HCC) Rate controlled on Metoprolol 25mg  BID. Has f/u with cardiology on Monday 06/14/20.   4. Kidney stone Resolved at this time. Has Urology f/u in January 2022.    No follow-ups on file.      February 2022, PA-C, have reviewed all documentation for this visit. The documentation on 06/15/20 for the exam, diagnosis, procedures, and orders are all accurate and complete.   06/17/20  Dorothy Puffer, Hershal Coria  [facility] [phone] (phone) [phone] (fax)  [place]

## 2020-06-11 NOTE — Patient Instructions (Signed)

## 2020-06-14 ENCOUNTER — Other Ambulatory Visit: Payer: Self-pay

## 2020-06-14 ENCOUNTER — Ambulatory Visit (INDEPENDENT_AMBULATORY_CARE_PROVIDER_SITE_OTHER): Payer: Self-pay | Admitting: Family

## 2020-06-14 ENCOUNTER — Other Ambulatory Visit: Payer: Self-pay | Admitting: Family

## 2020-06-14 ENCOUNTER — Encounter: Payer: Self-pay | Admitting: Family

## 2020-06-14 VITALS — BP 102/80 | HR 82 | Ht 70.0 in | Wt 216.5 lb

## 2020-06-14 DIAGNOSIS — I10 Essential (primary) hypertension: Secondary | ICD-10-CM

## 2020-06-14 DIAGNOSIS — I4821 Permanent atrial fibrillation: Secondary | ICD-10-CM

## 2020-06-14 MED ORDER — METOPROLOL TARTRATE 50 MG PO TABS: 50.0000 mg | ORAL_TABLET | Freq: Two times a day (BID) | ORAL | 1 refills | Status: DC

## 2020-06-14 NOTE — Patient Instructions (Signed)
Medication Instructions:  No medication changes today.   A refill of your Metoprolol was sent to Medication management clinic.   *If you need a refill on your cardiac medications before your next appointment, please call your pharmacy*  Lab Work: No lab work today.   Testing/Procedures: Your EKG today shows rate controlled atrial fibrillation.   Follow-Up: At [facility], you and your health needs are our priority.  As part of our continuing mission to provide you with exceptional heart care, we have created designated Provider Care Teams.  These Care Teams include your primary Cardiologist (physician) and Advanced Practice Providers (APPs -  Physician Assistants and Nurse Practitioners) who all work together to provide you with the care you need, when you need it.  We recommend signing up for the patient portal called "MyChart".  Sign up information is provided on this After Visit Summary.  MyChart is used to connect with patients for Virtual Visits (Telemedicine).  Patients are able to view lab/test results, encounter notes, upcoming appointments, etc.  Non-urgent messages can be sent to your provider as well.   To learn more about what you can do with MyChart, go to [email or web address].    Your next appointment:   3 month(s)  The format for your next appointment:   In Person  Provider:   You may see Lorine Bears, MD or one of the following Advanced Practice Providers on your designated Care Team:    Nicolasa Ducking, NP  Eula Listen, PA-C  Marisue Ivan, PA-C  Cadence Fransico Michael, [place]  Gillian Shields, NP  Other Instructions   Atrial Fibrillation  Atrial fibrillation is a type of heartbeat that is irregular or fast. If you have this condition, your heart beats without any order. This makes it hard for your heart to pump blood in a normal way. Atrial fibrillation may come and go, or it may become a long-lasting problem. If this condition is not treated, it can put  you at higher risk for stroke, heart failure, and other heart problems. What are the causes? This condition may be caused by diseases that damage the heart. They include:  High blood pressure.  Heart failure.  Heart valve disease.  Heart surgery. Other causes include:  Diabetes.  Thyroid disease.  Being overweight.  Kidney disease. Sometimes the cause is not known. What increases the risk? You are more likely to develop this condition if:  You are older.  You smoke.  You exercise often and very hard.  You have a family history of this condition.  You are a man.  You use drugs.  You drink a lot of alcohol.  You have lung conditions, such as emphysema, pneumonia, or COPD.  You have sleep apnea. What are the signs or symptoms? Common symptoms of this condition include:  A feeling that your heart is beating very fast.  Chest pain or discomfort.  Feeling short of breath.  Suddenly feeling light-headed or weak.  Getting tired easily during activity.  Fainting.  Sweating. In some cases, there are no symptoms. How is this treated? Treatment for this condition depends on underlying conditions and how you feel when you have atrial fibrillation. They include:  Medicines to: ? Prevent blood clots. ? Treat heart rate or heart rhythm problems.  Using devices, such as a pacemaker, to correct heart rhythm problems.  Doing surgery to remove the part of the heart that sends bad signals.  Closing an area where clots can form in the heart (left atrial appendage).  In some cases, your doctor will treat other underlying conditions. Follow these instructions at home: Medicines  Take over-the-counter and prescription medicines only as told by your doctor.  Do not take any new medicines without first talking to your doctor.  If you are taking blood thinners: ? Talk with your doctor before you take any medicines that have aspirin or NSAIDs, such as ibuprofen, in  them. ? Take your medicine exactly as told by your doctor. Take it at the same time each day. ? Avoid activities that could hurt or bruise you. Follow instructions about how to prevent falls. ? Wear a bracelet that says you are taking blood thinners. Or, carry a card that lists what medicines you take. Lifestyle      Do not use any products that have nicotine or tobacco in them. These include cigarettes, e-cigarettes, and chewing tobacco. If you need help quitting, ask your doctor.  Eat heart-healthy foods. Talk with your doctor about the right eating plan for you.  Exercise regularly as told by your doctor.  Do not drink alcohol.  Lose weight if you are overweight.  Do not use drugs, including cannabis. General instructions  If you have a condition that causes breathing to stop for a short period of time (apnea), treat it as told by your doctor.  Keep a healthy weight. Do not use diet pills unless your doctor says they are safe for you. Diet pills may make heart problems worse.  Keep all follow-up visits as told by your doctor. This is important. Contact a doctor if:  You notice a change in the speed, rhythm, or strength of your heartbeat.  You are taking a blood-thinning medicine and you get more bruising.  You get tired more easily when you move or exercise.  You have a sudden change in weight. Get help right away if:   You have pain in your chest or your belly (abdomen).  You have trouble breathing.  You have side effects of blood thinners, such as blood in your vomit, poop (stool), or pee (urine), or bleeding that cannot stop.  You have any signs of a stroke. "BE FAST" is an easy way to remember the main warning signs: ? B - Balance. Signs are dizziness, sudden trouble walking, or loss of balance. ? E - Eyes. Signs are trouble seeing or a change in how you see. ? F - Face. Signs are sudden weakness or loss of feeling in the face, or the face or eyelid drooping on  one side. ? A - Arms. Signs are weakness or loss of feeling in an arm. This happens suddenly and usually on one side of the body. ? S - Speech. Signs are sudden trouble speaking, slurred speech, or trouble understanding what people say. ? T - Time. Time to call emergency services. Write down what time symptoms started.  You have other signs of a stroke, such as: ? A sudden, very bad headache with no known cause. ? Feeling like you may vomit (nausea). ? Vomiting. ? A seizure. These symptoms may be an emergency. Do not wait to see if the symptoms will go away. Get medical help right away. Call your local emergency services (911 in the [place] Do not drive yourself to the hospital. Summary  Atrial fibrillation is a type of heartbeat that is irregular or fast.  You are at higher risk of this condition if you smoke, are older, have diabetes, or are overweight.  Follow your doctor's instructions about  medicines, diet, exercise, and follow-up visits.  Get help right away if you have signs or symptoms of a stroke.  Get help right away if you cannot catch your breath, or you have chest pain or discomfort. This information is not intended to replace advice given to you by your health care provider. Make sure you discuss any questions you have with your health care provider. Document Revised: 12/04/2018 Document Reviewed: 12/04/2018 Elsevier Patient Education  2020 [employer].

## 2020-06-14 NOTE — Progress Notes (Addendum)
Office Visit    Patient Name: Rickey Lane Date of Encounter: 06/14/2020  Primary Care Provider:  Margaretann Loveless, PA-C Primary Cardiologist:  Lorine Bears, MD Electrophysiologist:  None   Chief Complaint    ISHMEAL RORIE is a 54 y.o. male with a hx of permanent atrial fibrillation, sleep apnea, hypertension presents today for hospital follow-up  Past Medical History    Past Medical History:  Diagnosis Date  . Atrial fibrillation (HCC)    a. initially diagnosed in 09/2015. Reported a history of "irregular HR" for 3+ years  . GERD (gastroesophageal reflux disease)   . Headache   . Kidney stone   . Sleep apnea    Past Surgical History:  Procedure Laterality Date  . ELECTROPHYSIOLOGIC STUDY N/A 11/04/2015   Procedure: CARDIOVERSION;  Surgeon: Iran Ouch, MD;  Location: [facility] ORS;  Service: Cardiovascular;  Laterality: N/A;  . EXTRACORPOREAL SHOCK WAVE LITHOTRIPSY Left 05/27/2020   Procedure: EXTRACORPOREAL SHOCK WAVE LITHOTRIPSY (ESWL);  Surgeon: [name], MD;  Location: [facility] ORS;  Service: Urology;  Laterality: Left;  . lithotripsy    . URETEROSCOPY WITH HOLMIUM LASER LITHOTRIPSY Left 05/28/2020   Procedure: URETEROSCOPY WITH HOLMIUM LASER LITHOTRIPSY;  Surgeon: Sondra Come, MD;  Location: [facility] ORS;  Service: Urology;  Laterality: Left;    Allergies  Allergies  Allergen Reactions  . Nitroglycerin Other (See Comments)    "Shivering" and "decreased heart rate"    History of Present Illness    Rickey Lane is a 54 y.o. male with a hx of permanent atrial fibrillation, sleep apnea, hypertension last seen while hospital.  His history of atrial fibrillation dates back to 2017.  He was seen in the hospital 05/22/2020 after being admitted ureterolithiasis treated with left ureteroscopy, laser lithotripsy, retrograde pyelogram and left ureteral stent 05/28/2020 by urology.Marland Kitchen  He was noted to be off of his anticoagulation as well as  metoprolol for rate control and to told the consulting cardiologist that he prefers to avoid doctors.  Echocardiogram 05/27/2020 with LVEF 55%, no wall motion abnormalities, moderate LVH, RV normal size and function, LA mildly dilated. His Metoprolol was resumed prior to discharge.   He presents today for follow-up. Works at [employer]. Reports shortness of breath at rest and with exertion that has been ongoing for a couple of years. Reports an occasional wheezing but no cough. Previous smoker having quit in 2019. Reports no chest pain, pressure, tightness. He reports an occasional sensation of tachycardia, but is overall unaware of his atrial fibrillation. Endorses compliance with his Metoprolol  EKGs/Labs/Other Studies Reviewed:   The following studies were reviewed today: Echo 05/27/20 1. Left ventricular ejection fraction, by estimation, is 55 %. The left  ventricle has normal function. The left ventricle has no regional wall  motion abnormalities. There is moderate left ventricular hypertrophy.   2. Right ventricular systolic function is normal. The right ventricular  size is normal.   3. The mitral valve is normal in structure. No evidence of mitral valve  regurgitation. No evidence of mitral stenosis.   4. Left atrial size was mildly dilated.   5. Rhythm is atrial fibrillation/flutter   EKG:  EKG is  ordered today.  The ekg ordered today demonstrates rate controlled atrial fibrillation 82 bpm. No acute ST/T wave changes.   Recent Labs: 05/08/2020: B Natriuretic Peptide 26.2 05/26/2020: ALT 52 06/01/2020: BUN 25; Creatinine, Ser 0.79; Hemoglobin 15.7; Platelets 203; Potassium 3.9; Sodium 137  Recent Lipid Panel  Component Value Date/Time   CHOL 152 09/30/2015 0256   TRIG 62 09/30/2015 0256   HDL 51 09/30/2015 0256   CHOLHDL 3.0 09/30/2015 0256   VLDL 12 09/30/2015 0256   LDLCALC 89 09/30/2015 0256   Risk Assessment/Calculations:  CHA2DS2-VASc Score = 1  This indicates a 0.6%  annual risk of stroke. The patient's score is based upon: CHF History: No HTN History: Yes Diabetes History: No Stroke History: No Vascular Disease History: No Age Score: 0 Gender Score: 0     Home Medications   Current Meds  Medication Sig  . acetaminophen (TYLENOL) 500 MG tablet Take 500-1,000 mg by mouth every 6 (six) hours as needed for mild pain or moderate pain.   . famotidine (PEPCID) 20 MG tablet Take 20 mg by mouth 2 (two) times daily as needed for heartburn or indigestion.  . Garlic 500 MG CAPS Take 1 capsule by mouth as directed.  . Ginger 500 MG CAPS Take 1 capsule by mouth daily.  . Glucosamine-Chondroitin (OSTEO BI-FLEX REGULAR STRENGTH) 250-200 MG TABS Take 1 tablet by mouth as directed.  . Multiple Vitamin (MULTI-VITAMINS) TABS Take 1 tablet by mouth daily.   . sildenafil (VIAGRA) 100 MG tablet Take 1 tablet (100 mg total) by mouth daily as needed for erectile dysfunction. Take two hours prior to intercourse on an empty stomach  . zinc sulfate 220 (50 Zn) MG capsule Take 220 mg by mouth daily.  . [DISCONTINUED] metoprolol tartrate (LOPRESSOR) 50 MG tablet Take 1 tablet (50 mg total) by mouth 2 (two) times daily.     Review of Systems    All other systems reviewed and are otherwise negative except as noted above.  Physical Exam    VS:  BP 102/80 (BP Location: Left Arm, Patient Position: Sitting, Cuff Size: Normal)   Pulse 82   Ht 5\' 10"  (1.778 m)   Wt 216 lb 8 oz (98.2 kg)   SpO2 98%   BMI 31.06 kg/m  , BMI Body mass index is 31.06 kg/m.  Wt Readings from Last 3 Encounters:  06/14/20 216 lb 8 oz (98.2 kg)  06/11/20 215 lb 3.2 oz (97.6 kg)  06/04/20 214 lb (97.1 kg)     GEN: Well nourished, overweight,  well developed, in no acute distress. HEENT: normal. Neck: Supple, no JVD, carotid bruits, or masses. Cardiac: irregularly irregular, no murmurs, rubs, or gallops. No clubbing, cyanosis, edema.  Radials/DP/PT 2+ and equal bilaterally.  Respiratory:   Respirations regular and unlabored, clear to auscultation bilaterally. GI: Soft, nontender, nondistended. MS: No deformity or atrophy. Skin: Warm and dry, no rash. Neuro:  Strength and sensation are intact. Psych: Normal affect.  Assessment & Plan    1. Permanent atrial fibrillation- CHA2DS2-VASc Score = 1 [CHF History: No, HTN History: Yes, Diabetes History: No, Stroke History: No, Vascular Disease History: No, Age Score: 0, Gender Score: 0].  Therefore, the patient's annual risk of stroke is 0.6 %.   He has historically declined OAC and again politely declines today despite lengthy discussion regarding risks and benefits of anticoagulation in the setting of anticoagulation.  Due to prior history of medical noncompliance and longstanding duration of atrial fibrillation, no plan for rhythm control at this time.  Continue adequate rate control. Rate well controlled on Metoprolol Tartrate 25mg  BID, refill provided.   2. HTN - BP well controlled. Continue current antihypertensive regimen including Metoprolol tartrate 25mg  BID.   Disposition: Follow up in 3 month(s) with Dr. 14/10/21 or APP  Signed,  Alver Sorrow, NP 06/14/2020, 1:15 PM [facility]

## 2020-06-24 ENCOUNTER — Ambulatory Visit: Payer: Self-pay | Admitting: Physician Assistant

## 2020-07-07 ENCOUNTER — Ambulatory Visit: Payer: Self-pay | Admitting: Urology

## 2020-07-20 NOTE — Progress Notes (Deleted)
09/05/2018 12:56 PM   Rickey Lane [birth year] [id number]  Referring provider: Margaretann LovelessBurnette, Jennifer M, PA-C [address] [place] [id number]  No chief complaint on file.  Urological history 1. Nephrolithiasis - left urs for proximal stones 05/28/2020  2. Renal cysts - very small cysts bilaterally   3. BPH with LU TS - PSA 0.7 in 06/2019 - I PSS *** - PVR *** - could not tolerate tamsulosin due to retrograde ejaculation - managed with finasteride 5 mg daily   4. Nocturia - could not tolerate CPAP machine - Toviaz and Myrbetriq were ineffective  5. ED - SHIM *** - contributing factors ofage, BPH, sleep apnea and history of smoking - managed with sildenafil   6. Intermediate risk hematuria - former smoker - RUS in 07/2019 7 mm stone in left kidney and sub centimeter cyst in right kidney - cysto "No show" 07/2019 - contrast CT 04/2020 bilateral minimal nonobstructing nephrolithiasis. Superimposed nonobstructing left urolithiasis with 9 mm calculus noted at the ureteropelvic junction - CT renal stone 04/2020 a collection of 2-3 ureteral stones in the proximal left ureter, slightly past the UPJ, with the largest calculus measuring 9 mm.  Subsequent obstructive uropathy of the left kidney with moderate left hydronephrosis and proximal hydroureter.  Additional tiny nonobstructive bilateral renal calculi. - cysto 05/2020 during urs noted high bladder neck - UA ***  7. Family history of prostate cancer -  maternal grandfather with prostate cancer     HPI: Rickey Lane is a 55 y.o. male who presents for yearly follow up.         Score:  1-7 Mild 8-19 Moderate 20-35 Severe         Score: 1-7 Severe ED 8-11 Moderate ED 12-16 Mild-Moderate ED 17-21 Mild ED 22-25 No ED      PMH: Past Medical History:  Diagnosis Date  . Atrial fibrillation (HCC)    a. initially diagnosed in 09/2015. Reported a history of "irregular  HR" for 3+ years  . GERD (gastroesophageal reflux disease)   . Headache   . Kidney stone   . Sleep apnea     Surgical History: Past Surgical History:  Procedure Laterality Date  . ELECTROPHYSIOLOGIC STUDY N/A 11/04/2015   Procedure: CARDIOVERSION;  Surgeon: Iran OuchMuhammad A Arida, MD;  Location: [facility] ORS;  Service: Cardiovascular;  Laterality: N/A;  . EXTRACORPOREAL SHOCK WAVE LITHOTRIPSY Left 05/27/2020   Procedure: EXTRACORPOREAL SHOCK WAVE LITHOTRIPSY (ESWL);  Surgeon: Vanna ScotlandBrandon, Ashley, MD;  Location: [facility] ORS;  Service: Urology;  Laterality: Left;  . lithotripsy    . URETEROSCOPY WITH HOLMIUM LASER LITHOTRIPSY Left 05/28/2020   Procedure: URETEROSCOPY WITH HOLMIUM LASER LITHOTRIPSY;  Surgeon: Sondra ComeSninsky, Brian C, MD;  Location: [facility] ORS;  Service: Urology;  Laterality: Left;    Home Medications:  Allergies as of 07/21/2020      Reactions   Nitroglycerin Other (See Comments)   "Shivering" and "decreased heart rate"      Medication List       Accurate as of July 20, 2020 12:56 PM. If you have any questions, ask your nurse or doctor.        acetaminophen 500 MG tablet Commonly known as: TYLENOL Take 500-1,000 mg by mouth every 6 (six) hours as needed for mild pain or moderate pain.   famotidine 20 MG tablet Commonly known as: PEPCID Take 20 mg by mouth 2 (two) times daily as needed for heartburn or indigestion.   Garlic 500 MG Caps Take  1 capsule by mouth as directed.   Ginger 500 MG Caps Take 1 capsule by mouth daily.   metoprolol tartrate 50 MG tablet Commonly known as: LOPRESSOR Take 1 tablet (50 mg total) by mouth 2 (two) times daily.   Multi-Vitamins Tabs Take 1 tablet by mouth daily.   Osteo Bi-Flex Regular Strength 250-200 MG Tabs Generic drug: Glucosamine-Chondroitin Take 1 tablet by mouth as directed.   sildenafil 100 MG tablet Commonly known as: VIAGRA Take 1 tablet (100 mg total) by mouth daily as needed for erectile dysfunction. Take two hours prior to  intercourse on an empty stomach   zinc sulfate 220 (50 Zn) MG capsule Take 220 mg by mouth daily.       Allergies:  Allergies  Allergen Reactions  . Nitroglycerin Other (See Comments)    "Shivering" and "decreased heart rate"    Family History: Family History  Problem Relation Age of Onset  . Heart attack Father        Initial MI age 54, deceased in his 4160's from a massive MI  . Heart attack Maternal Aunt 55  . Prostate cancer Maternal Grandfather   . Kidney cancer Neg Hx   . Bladder Cancer Neg Hx     Social History:  reports that he quit smoking about 2 years ago. His smoking use included cigarettes. He has a 20.00 pack-year smoking history. He has never used smokeless tobacco. He reports that he does not drink alcohol and does not use drugs.  For pertinent review of systems please refer to history of present illness  Physical Exam: There were no vitals taken for this visit.  Constitutional:  Well nourished. Alert and oriented, No acute distress. HEENT: [place] AT, moist mucus membranes.  Trachea midline Cardiovascular: No clubbing, cyanosis, or edema. Respiratory: Normal respiratory effort, no increased work of breathing. GI: Abdomen is soft, non tender, non distended, no abdominal masses. Liver and spleen not palpable.  No hernias appreciated.  Stool sample for occult testing is not indicated.   GU: No CVA tenderness.  No bladder fullness or masses.  Patient with circumcised/uncircumcised phallus. ***Foreskin easily retracted***  Urethral meatus is patent.  No penile discharge. No penile lesions or rashes. Scrotum without lesions, cysts, rashes and/or edema.  Testicles are located scrotally bilaterally. No masses are appreciated in the testicles. Left and right epididymis are normal. Rectal: Patient with  normal sphincter tone. Anus and perineum without scarring or rashes. No rectal masses are appreciated. Prostate is approximately *** grams, *** nodules are appreciated. Seminal  vesicles are normal. Skin: No rashes, bruises or suspicious lesions. Lymph: No inguinal adenopathy. Neurologic: Grossly intact, no focal deficits, moving all 4 extremities. Psychiatric: Normal mood and affect.  Laboratory Data: PSA Trend  09/30/2015, 0.61  Component     Latest Ref Rng & Units 04/11/2017 05/14/2017 09/03/2018 07/17/2019  Prostate Specific Ag, Serum     0.0 - 4.0 ng/mL 0.9 1.1 0.3 0.7   Component     Latest Ref Rng & Units 06/04/2020  Specific Gravity, UA     1.005 - 1.030 1.020  pH, UA     5.0 - 7.5 6.0  Color, UA     Yellow Yellow  Appearance Ur     Clear Clear  Leukocytes,UA     Negative 2+ (A)  Protein,UA     Negative/Trace 1+ (A)  Glucose, UA     Negative Negative  Ketones, UA     Negative Negative  RBC, UA  Negative 2+ (A)  Bilirubin, UA     Negative Negative  Urobilinogen, Ur     0.2 - 1.0 mg/dL 0.2  Nitrite, UA     Negative Negative  Microscopic Examination      See below:   Component     Latest Ref Rng & Units 06/04/2020  WBC, UA     0 - 5 /hpf 6-10 (A)  RBC     0 - 2 /hpf 11-30 (A)  Epithelial Cells (non renal)     0 - 10 /hpf None seen  Bacteria, UA     None seen/Few Few (A)   Component     Latest Ref Rng & Units 06/01/2020  Sodium     135 - 145 mmol/L 137  Potassium     3.5 - 5.1 mmol/L 3.9  Chloride     98 - 111 mmol/L 104  CO2     22 - 32 mmol/L 23  Glucose     70 - 99 mg/dL 92  BUN     6 - 20 mg/dL 25 (H)  Creatinine     0.61 - 1.24 mg/dL 3.49  Calcium     8.9 - 10.3 mg/dL 17.9  GFR, Estimated     >60 mL/min >60  Anion gap     5 - 15 10   Component     Latest Ref Rng & Units 06/01/2020  Sodium     135 - 145 mmol/L 137  Potassium     3.5 - 5.1 mmol/L 3.9  Chloride     98 - 111 mmol/L 104  CO2     22 - 32 mmol/L 23  Glucose     70 - 99 mg/dL 92  BUN     6 - 20 mg/dL 25 (H)  Creatinine     0.61 - 1.24 mg/dL 1.50  Calcium     8.9 - 10.3 mg/dL 56.9  GFR, Estimated     >60 mL/min >60  Anion gap      5 - 15 10   Component     Latest Ref Rng & Units 06/04/2020  WBC     4.0 - 10.5 K/uL   RBC     0 - 2 /hpf 11-30 (A)  Hemoglobin     13.0 - 17.0 g/dL   HCT     79.4 - 80.1 %   MCV     80.0 - 100.0 fL   MCH     26.0 - 34.0 pg   MCHC     30.0 - 36.0 g/dL   RDW     65.5 - 37.4 %   Platelets     150 - 400 K/uL   Neutrophils     %   NEUT#     1.4 - 6.5 K/uL   Lymphocytes     %   Lymphocyte #     1.0 - 3.6 K/uL   Monocytes Relative     %   Monocyte #     0.2 - 1.0 K/uL   Eosinophil     %   Eosinophils Absolute     0 - 0.7 K/uL   Basophil     %   Basophils Absolute     0 - 0.1 K/uL   nRBC     0.0 - 0.2 %   WBC, UA     0 - 5 /hpf 6-10 (A)  Epithelial Cells (non renal)  0 - 10 /hpf None seen  Casts     None seen /lpf   Cast Type     N/A   Bacteria, UA     None seen/Few Few (A)   Component     Latest Ref Rng & Units 06/01/2020  WBC     4.0 - 10.5 K/uL 9.0  RBC     0 - 2 /hpf 5.03  Hemoglobin     13.0 - 17.0 g/dL 29.9  HCT     24.2 - 68.3 % 44.6  MCV     80.0 - 100.0 fL 88.7  MCH     26.0 - 34.0 pg 31.2  MCHC     30.0 - 36.0 g/dL 41.9  RDW     62.2 - 29.7 % 11.9  Platelets     150 - 400 K/uL 203  Neutrophils     %   NEUT#     1.4 - 6.5 K/uL   Lymphocytes     %   Lymphocyte #     1.0 - 3.6 K/uL   Monocytes Relative     %   Monocyte #     0.2 - 1.0 K/uL   Eosinophil     %   Eosinophils Absolute     0 - 0.7 K/uL   Basophil     %   Basophils Absolute     0 - 0.1 K/uL   nRBC     0.0 - 0.2 % 0.0  WBC, UA     0 - 5 /hpf   Epithelial Cells (non renal)     0 - 10 /hpf   Casts     None seen /lpf   Cast Type     N/A   Bacteria, UA     None seen/Few     I have reviewed the labs.  Pertinent Imaging ***  Assessment & Plan:   1. Microscopic hematuria (intermediate risk) Patient meets the criteria for intermediate risk according to the new AUA guidelines for [facility] I explained to the patient that there are a number of causes  that can be associated with blood in the urine, such as stones, BPH, UTI's, damage to the urinary tract and/or cancer. At this time, we will proceed with a renal ultrasound and cystoscopy I described how the cystoscopy is performed, typically in an office setting with a flexible cystoscope. We described the risks, benefits, and possible side effects, the most common of which is a minor amount of blood in the urine and/or burning which usually resolves in 24 to 48 hours The patient had the opportunity to ask questions which were answered. Based upon this discussion, the patient is willing to proceed. Therefore, I've ordered: a RUS and cystoscopy.  2. Left nephrolithiasis Stable on today's (07/22/2019) KUB Will continue to monitor Patient is advised that if they should start to experience pain that is not able to be controlled with pain medication, intractable nausea and/or vomiting and/or fevers greater than 103 or shaking chills to contact the office immediately or seek treatment in the emergency department for emergent intervention.    3. BPH with LUTS IPSS score is 7/4, it is slightly worse Continue conservative management, avoiding bladder irritants and timed voiding's Most bothersome symptoms is/are post void dribbling and nocturia Nocturia is likely due to the untreated sleep apnea Continue the finasteride 5 mg daily Failed medication therapy for post void dribbling - Cystoscopy pending  4. Nocturia Could not tolerate  CPAP machine Asked for a note from my to excuse him from wearing a face mask due to his sleep apnea, I refused  5. Erectile dysfunction SHIM score is 20 Sildenafil 100 mg effective - refill given today RTC in one year for SHIM    No follow-ups on file.  Michiel Cowboy, PA-C   [facility] [address] [place], [place] [address] [phone]

## 2020-07-21 ENCOUNTER — Encounter: Payer: Self-pay | Admitting: Urology

## 2020-07-21 ENCOUNTER — Ambulatory Visit: Payer: Self-pay | Admitting: Urology

## 2020-07-21 DIAGNOSIS — N138 Other obstructive and reflux uropathy: Secondary | ICD-10-CM

## 2020-07-21 DIAGNOSIS — N529 Male erectile dysfunction, unspecified: Secondary | ICD-10-CM

## 2020-07-21 DIAGNOSIS — N200 Calculus of kidney: Secondary | ICD-10-CM

## 2020-07-23 ENCOUNTER — Telehealth: Payer: Self-pay | Admitting: Pharmacist

## 2020-07-23 NOTE — Telephone Encounter (Signed)
Patient failed to provide requested 2021 and 2022 financial documentation. No additional medication assistance will be provided by [facility] without the required proof of income documentation. Patient notified by letter. Debra Cheek Administrative Assistant [facility] 

## 2020-09-13 ENCOUNTER — Ambulatory Visit: Payer: Self-pay | Admitting: Family

## 2020-09-13 ENCOUNTER — Encounter: Payer: Self-pay | Admitting: Urology

## 2020-09-13 NOTE — Progress Notes (Deleted)
Office Visit    Patient Name: Rickey Lane Date of Encounter: 09/13/2020  Primary Care Provider:  Margaretann Loveless, PA-C Primary Cardiologist:  Lorine Bears, MD Electrophysiologist:  None   Chief Complaint    Rickey Lane is a 55 y.o. male with a hx of permanent atrial fibrillation, sleep apnea, hypertension presents today for follow up of atrial fibrillation.   Past Medical History    Past Medical History:  Diagnosis Date  . Atrial fibrillation (HCC)    a. initially diagnosed in 09/2015. Reported a history of "irregular HR" for 3+ years  . GERD (gastroesophageal reflux disease)   . Headache   . Kidney stone   . Sleep apnea    Past Surgical History:  Procedure Laterality Date  . ELECTROPHYSIOLOGIC STUDY N/A 11/04/2015   Procedure: CARDIOVERSION;  Surgeon: Iran Ouch, MD;  Location: [facility] ORS;  Service: Cardiovascular;  Laterality: N/A;  . EXTRACORPOREAL SHOCK WAVE LITHOTRIPSY Left 05/27/2020   Procedure: EXTRACORPOREAL SHOCK WAVE LITHOTRIPSY (ESWL);  Surgeon: [name], MD;  Location: [facility] ORS;  Service: Urology;  Laterality: Left;  . lithotripsy    . URETEROSCOPY WITH HOLMIUM LASER LITHOTRIPSY Left 05/28/2020   Procedure: URETEROSCOPY WITH HOLMIUM LASER LITHOTRIPSY;  Surgeon: Sondra Come, MD;  Location: [facility] ORS;  Service: Urology;  Laterality: Left;    Allergies  Allergies  Allergen Reactions  . Nitroglycerin Other (See Comments)    "Shivering" and "decreased heart rate"    History of Present Illness    Rickey Lane is a 55 y.o. male with a hx of permanent atrial fibrillation, sleep apnea, hypertension last seen 06/14/20.  His history of atrial fibrillation dates back to 2017.  He was seen in the hospital 05/22/2020 after being admitted ureterolithiasis treated with left ureteroscopy, laser lithotripsy, retrograde pyelogram and left ureteral stent 05/28/2020 by urology.Marland Kitchen  He was noted to be off of his anticoagulation as well  as metoprolol for rate control and to told the consulting cardiologist that he prefers to avoid doctors.  Echocardiogram 05/27/2020 with LVEF 55%, no wall motion abnormalities, moderate LVH, RV normal size and function, LA mildly dilated. His Metoprolol was resumed prior to discharge.   He presents today for follow-up. Works at [employer]. Reports shortness of breath at rest and with exertion that has been ongoing for a couple of years. Reports an occasional wheezing but no cough. Previous smoker having quit in 2019. Reports no chest pain, pressure, tightness. He reports an occasional sensation of tachycardia, but is overall unaware of his atrial fibrillation. Endorses compliance with his Metoprolol  ***  EKGs/Labs/Other Studies Reviewed:   The following studies were reviewed today:  Echo 05/27/20 1. Left ventricular ejection fraction, by estimation, is 55 %. The left  ventricle has normal function. The left ventricle has no regional wall  motion abnormalities. There is moderate left ventricular hypertrophy.   2. Right ventricular systolic function is normal. The right ventricular  size is normal.   3. The mitral valve is normal in structure. No evidence of mitral valve  regurgitation. No evidence of mitral stenosis.   4. Left atrial size was mildly dilated.   5. Rhythm is atrial fibrillation/flutter   EKG:  EKG is  ordered today.  The ekg ordered today demonstrates atrial fib 82 bpm. No acute ST/T wave changes.  ***  Recent Labs: 05/08/2020: B Natriuretic Peptide 26.2 05/26/2020: ALT 52 06/01/2020: BUN 25; Creatinine, Ser 0.79; Hemoglobin 15.7; Platelets 203; Potassium 3.9; Sodium 137  Recent  Lipid Panel    Component Value Date/Time   CHOL 152 09/30/2015 0256   TRIG 62 09/30/2015 0256   HDL 51 09/30/2015 0256   CHOLHDL 3.0 09/30/2015 0256   VLDL 12 09/30/2015 0256   LDLCALC 89 09/30/2015 0256   Risk Assessment/Calculations:  CHA2DS2-VASc Score = 1  This indicates a 0.6% annual risk  of stroke. The patient's score is based upon: CHF History: No HTN History: Yes Diabetes History: No Stroke History: No Vascular Disease History: No Age Score: 0 Gender Score: 0     Home Medications   No outpatient medications have been marked as taking for the 09/13/20 encounter (Appointment) with Alver Sorrow, NP.     Review of Systems    All other systems reviewed and are otherwise negative except as noted above.  Physical Exam    VS:  There were no vitals taken for this visit. , BMI There is no height or weight on file to calculate BMI.  Wt Readings from Last 3 Encounters:  06/14/20 216 lb 8 oz (98.2 kg)  06/11/20 215 lb 3.2 oz (97.6 kg)  06/04/20 214 lb (97.1 kg)    *** GEN: Well nourished, overweight,  well developed, in no acute distress. HEENT: normal. Neck: Supple, no JVD, carotid bruits, or masses. Cardiac: RRR, no murmurs, rubs, or gallops. No clubbing, cyanosis, edema.  Radials/DP/PT 2+ and equal bilaterally.  Respiratory:  Respirations regular and unlabored, clear to auscultation bilaterally. GI: Soft, nontender, nondistended. MS: No deformity or atrophy. Skin: Warm and dry, no rash. Neuro:  Strength and sensation are intact. Psych: Normal affect.  Assessment & Plan    1. Permanent atrial fibrillation- CHA2DS2-VASc Score = 1 [CHF History: No, HTN History: Yes, Diabetes History: No, Stroke History: No, Vascular Disease History: No, Age Score: 0, Gender Score: 0].  Therefore, the patient's annual risk of stroke is 0.6 %.   He has historically declined OAC and again politely declines today despite lengthy discussion regarding risks and benefits of anticoagulation in the setting of anticoagulation.  Due to prior history of medical noncompliance and longstanding duration of atrial fibrillation, no plan for rhythm control at this time.  Continue adequate rate control. Rate well controlled on Metoprolol Tartrate 25mg  BID, refill provided. ***  2. HTN - BP well  controlled. Continue current antihypertensive regimen including Metoprolol tartrate 25mg  BID. ***  Disposition: Follow up*** in 3 month(s) with Dr. or APP  Signed, , NP 09/13/2020, 8:55 AM [facility]

## 2020-09-14 ENCOUNTER — Encounter: Payer: Self-pay | Admitting: Family

## 2021-11-25 IMAGING — CR DG ABDOMEN 1V
2 series · 2 of 2 positions shown · non-contrast
Comparison: 09/23/2018.

CLINICAL DATA: Follow-up left kidney stones.  Left flank pain.

EXAM:
ABDOMEN - 1 VIEW

[abdomen kub (1 of 2)]
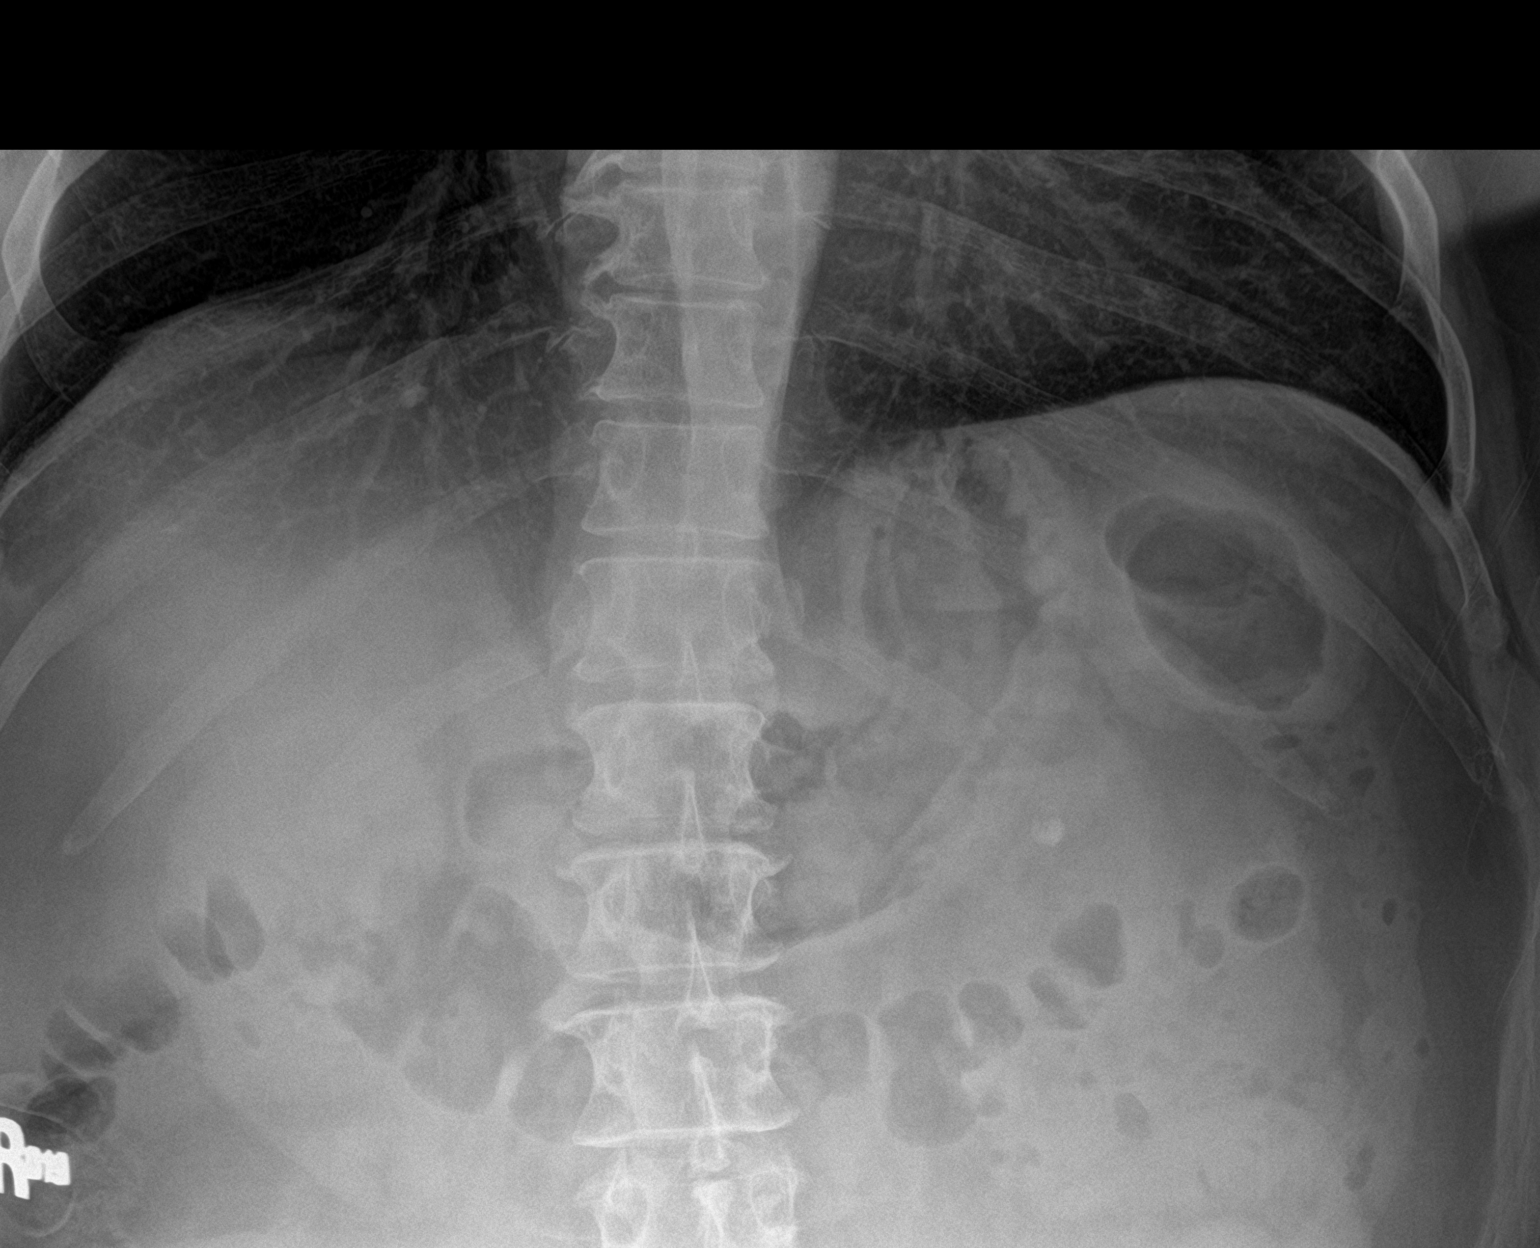

[abdomen kub (2 of 2)]
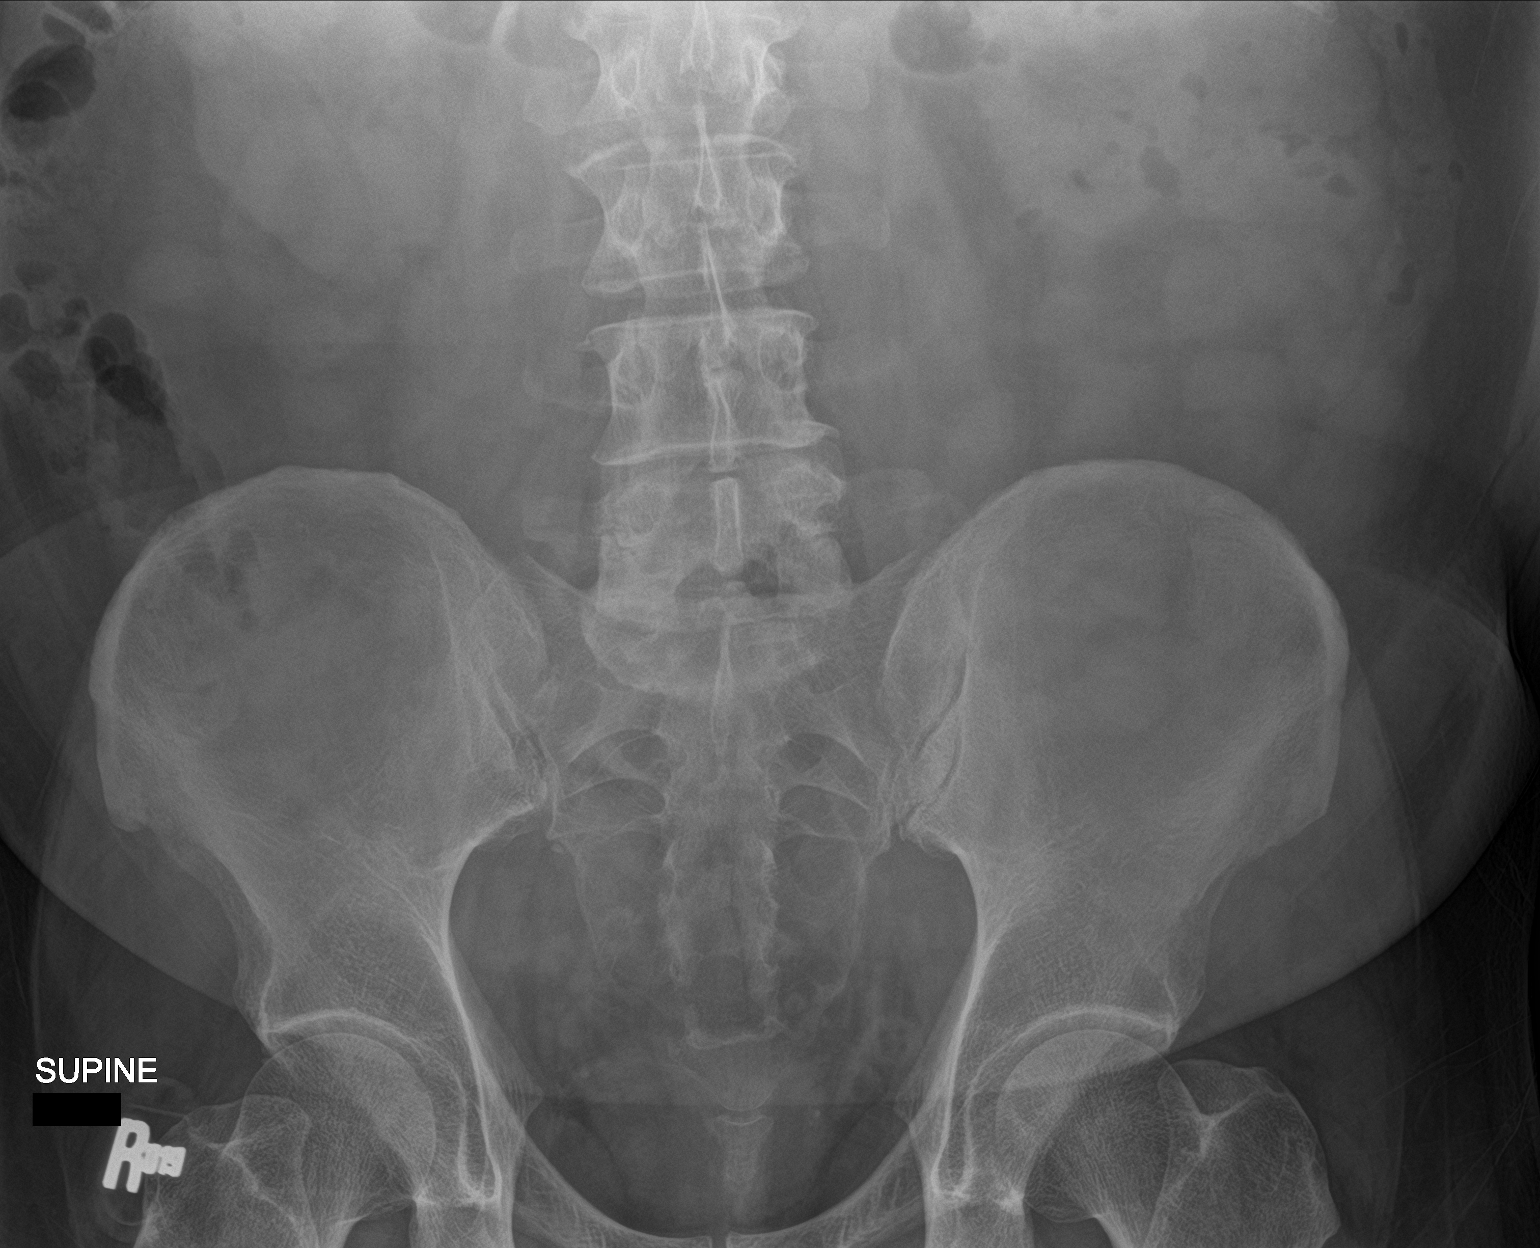

[2 of 2 positions shown; findings below may reference images not displayed]

FINDINGS: 6 mm calculus projects in the left kidney, unchanged from the prior
study. No other evidence of an intrarenal stone.

Tiny density lies in the left lower pelvis with another small round
density in the right lower pelvis, both stable consistent with
phleboliths. No convincing ureteral stone.

Normal bowel gas pattern.  Soft tissues are otherwise unremarkable.
IMPRESSION: 1. No convincing ureteral stone.
2. Stable left intrarenal stone.

## 2022-11-13 ENCOUNTER — Other Ambulatory Visit: Payer: Self-pay
